# Patient Record
Sex: Female | Born: 1995 | Race: Black or African American | Hispanic: No | Marital: Single | Smoking: Never smoker
Health system: Southern US, Community
[De-identification: ages and names within clinical notes are randomized; demographics above are authoritative.]

## PROBLEM LIST (undated history)

## (undated) ENCOUNTER — Inpatient Hospital Stay (HOSPITAL_COMMUNITY): Payer: Self-pay

## (undated) ENCOUNTER — Emergency Department (HOSPITAL_COMMUNITY): Admission: EM | Payer: Self-pay | Source: Home / Self Care

## (undated) DIAGNOSIS — S83106A Unspecified dislocation of unspecified knee, initial encounter: Secondary | ICD-10-CM

## (undated) DIAGNOSIS — I10 Essential (primary) hypertension: Secondary | ICD-10-CM

## (undated) DIAGNOSIS — J45909 Unspecified asthma, uncomplicated: Secondary | ICD-10-CM

## (undated) DIAGNOSIS — L309 Dermatitis, unspecified: Secondary | ICD-10-CM

## (undated) DIAGNOSIS — F319 Bipolar disorder, unspecified: Secondary | ICD-10-CM

## (undated) DIAGNOSIS — R4689 Other symptoms and signs involving appearance and behavior: Secondary | ICD-10-CM

## (undated) HISTORY — DX: Unspecified asthma, uncomplicated: J45.909

## (undated) HISTORY — PX: TONSILLECTOMY: SUR1361

---
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
## 2014-01-05 ENCOUNTER — Emergency Department (HOSPITAL_COMMUNITY)
Admission: EM | Admit: 2014-01-05 | Discharge: 2014-01-05 | Disposition: A | Discharge status: Home / Self Care | Payer: Medicaid Other | Attending: Emergency Medicine | Admitting: Emergency Medicine

## 2014-01-05 ENCOUNTER — Encounter (HOSPITAL_COMMUNITY): Payer: Self-pay | Admitting: Emergency Medicine

## 2014-01-05 DIAGNOSIS — S199XXA Unspecified injury of neck, initial encounter: Principal | ICD-10-CM

## 2014-01-05 DIAGNOSIS — S0993XA Unspecified injury of face, initial encounter: Secondary | ICD-10-CM | POA: Insufficient documentation

## 2014-01-05 DIAGNOSIS — M542 Cervicalgia: Secondary | ICD-10-CM

## 2014-01-05 DIAGNOSIS — Z79899 Other long term (current) drug therapy: Secondary | ICD-10-CM | POA: Diagnosis not present

## 2014-01-05 DIAGNOSIS — IMO0002 Reserved for concepts with insufficient information to code with codable children: Secondary | ICD-10-CM | POA: Diagnosis not present

## 2014-01-05 DIAGNOSIS — Z872 Personal history of diseases of the skin and subcutaneous tissue: Secondary | ICD-10-CM | POA: Insufficient documentation

## 2014-01-05 MED ORDER — NAPROXEN 500 MG PO TABS: 500.0000 mg | ORAL_TABLET | Freq: Two times a day (BID) | ORAL | Status: DC
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
## 2014-01-05 NOTE — ED Notes (Signed)
Pt would also like to request a pregnancy test, if possible.

## 2014-01-06 NOTE — ED Provider Notes (Signed)
Medical screening examination/treatment/procedure(s) were performed by non-physician practitioner and as supervising physician I was immediately available for consultation/collaboration.  Megan Docherty, MD 01/06/14 1435 

## 2014-10-16 ENCOUNTER — Encounter (HOSPITAL_COMMUNITY): Payer: Self-pay

## 2014-10-16 ENCOUNTER — Emergency Department (HOSPITAL_COMMUNITY)
Admission: EM | Admit: 2014-10-16 | Discharge: 2014-10-16 | Disposition: A | Discharge status: Home / Self Care | Payer: Medicaid Other | Attending: Emergency Medicine | Admitting: Emergency Medicine

## 2014-10-16 DIAGNOSIS — Z872 Personal history of diseases of the skin and subcutaneous tissue: Secondary | ICD-10-CM | POA: Insufficient documentation

## 2014-10-16 DIAGNOSIS — Z3202 Encounter for pregnancy test, result negative: Secondary | ICD-10-CM | POA: Insufficient documentation

## 2014-10-16 DIAGNOSIS — R51 Headache: Secondary | ICD-10-CM | POA: Insufficient documentation

## 2014-10-16 DIAGNOSIS — R1030 Lower abdominal pain, unspecified: Secondary | ICD-10-CM | POA: Diagnosis present

## 2014-10-16 DIAGNOSIS — F1210 Cannabis abuse, uncomplicated: Secondary | ICD-10-CM | POA: Diagnosis not present

## 2014-10-16 DIAGNOSIS — R1084 Generalized abdominal pain: Secondary | ICD-10-CM | POA: Insufficient documentation

## 2014-10-16 DIAGNOSIS — R519 Headache, unspecified: Secondary | ICD-10-CM

## 2014-10-16 HISTORY — DX: Other symptoms and signs involving appearance and behavior: R46.89

## 2014-10-16 HISTORY — DX: Bipolar disorder, unspecified: F31.9

## 2014-10-16 LAB — COMPREHENSIVE METABOLIC PANEL
ALT: 16 U/L (ref 14–54)
AST: 75 U/L — AB (ref 15–41)
Albumin: 4.3 g/dL (ref 3.5–5.0)
Alkaline Phosphatase: 51 U/L (ref 38–126)
Anion gap: 10 (ref 5–15)
BUN: 10 mg/dL (ref 6–20)
CHLORIDE: 106 mmol/L (ref 101–111)
CO2: 24 mmol/L (ref 22–32)
CREATININE: 0.91 mg/dL (ref 0.44–1.00)
Calcium: 9.6 mg/dL (ref 8.9–10.3)
GFR calc Af Amer: >60 mL/min (ref >60)
Glucose, Bld: 91 mg/dL (ref 65–99)
Potassium: 3.7 mmol/L (ref 3.5–5.1)
Sodium: 140 mmol/L (ref 135–145)
TOTAL PROTEIN: 7.3 g/dL (ref 6.5–8.1)
Total Bilirubin: 0.7 mg/dL (ref 0.3–1.2)

## 2014-10-16 LAB — I-STAT BETA HCG BLOOD, ED (MC, WL, AP ONLY): I-stat hCG, quantitative: <5 m[IU]/mL (ref <5)

## 2014-10-16 LAB — CBC WITH DIFFERENTIAL/PLATELET
BASOS ABS: 0 10*3/uL (ref 0.0–0.1)
Basophils Relative: 0 % (ref 0–1)
EOS ABS: 0.1 10*3/uL (ref 0.0–0.7)
Eosinophils Relative: 1 % (ref 0–5)
HCT: 38 % (ref 36.0–46.0)
Hemoglobin: 12.9 g/dL (ref 12.0–15.0)
Lymphocytes Relative: 17 % (ref 12–46)
Lymphs Abs: 1.4 10*3/uL (ref 0.7–4.0)
MCH: 31.4 pg (ref 26.0–34.0)
MCHC: 33.9 g/dL (ref 30.0–36.0)
MCV: 92.5 fL (ref 78.0–100.0)
Monocytes Absolute: 0.5 10*3/uL (ref 0.1–1.0)
Monocytes Relative: 7 % (ref 3–12)
NEUTROS ABS: 6.2 10*3/uL (ref 1.7–7.7)
NEUTROS PCT: 75 % (ref 43–77)
Platelets: 252 10*3/uL (ref 150–400)
RBC: 4.11 MIL/uL (ref 3.87–5.11)
RDW: 12.9 % (ref 11.5–15.5)
WBC: 8.2 10*3/uL (ref 4.0–10.5)

## 2014-10-16 LAB — ETHANOL: Alcohol, Ethyl (B): <5 mg/dL (ref <5)

## 2014-10-16 LAB — RAPID URINE DRUG SCREEN, HOSP PERFORMED
Amphetamines: NOT DETECTED
Barbiturates: NOT DETECTED
Benzodiazepines: NOT DETECTED
Cocaine: NOT DETECTED
Opiates: NOT DETECTED
Tetrahydrocannabinol: POSITIVE — AB

## 2014-10-16 LAB — POC URINE PREG, ED: Preg Test, Ur: NEGATIVE
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
## 2014-10-16 NOTE — ED Notes (Signed)
MD at bedside. 

## 2014-10-16 NOTE — Discharge Instructions (Signed)
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
## 2014-10-16 NOTE — ED Notes (Signed)
Mignon PineYVONNE FORSTER-MOTHER [phone]  MEDICAL HX FOR THIS PT CONTACT  [facility]  [facility] [place]
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
## 2014-10-16 NOTE — ED Notes (Signed)
Pt asking about results of blood work and where she could get a copy. rn explained mychart to pt and how to see results through that system.
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
## 2014-12-01 ENCOUNTER — Ambulatory Visit (INDEPENDENT_AMBULATORY_CARE_PROVIDER_SITE_OTHER): Payer: Self-pay

## 2014-12-01 DIAGNOSIS — Z309 Encounter for contraceptive management, unspecified: Secondary | ICD-10-CM

## 2014-12-01 DIAGNOSIS — Z3202 Encounter for pregnancy test, result negative: Secondary | ICD-10-CM

## 2014-12-01 LAB — POCT URINE PREGNANCY: Preg Test, Ur: NEGATIVE

## 2015-06-03 ENCOUNTER — Emergency Department (HOSPITAL_COMMUNITY): Payer: Medicaid Other

## 2015-06-03 ENCOUNTER — Encounter (HOSPITAL_COMMUNITY): Payer: Self-pay | Admitting: Emergency Medicine

## 2015-06-03 ENCOUNTER — Emergency Department (HOSPITAL_COMMUNITY)
Admission: EM | Admit: 2015-06-03 | Discharge: 2015-06-03 | Disposition: A | Discharge status: Home / Self Care | Payer: Medicaid Other | Attending: Emergency Medicine | Admitting: Emergency Medicine

## 2015-06-03 DIAGNOSIS — R059 Cough, unspecified: Secondary | ICD-10-CM

## 2015-06-03 DIAGNOSIS — J069 Acute upper respiratory infection, unspecified: Secondary | ICD-10-CM | POA: Diagnosis not present

## 2015-06-03 DIAGNOSIS — Z8659 Personal history of other mental and behavioral disorders: Secondary | ICD-10-CM | POA: Insufficient documentation

## 2015-06-03 DIAGNOSIS — Z872 Personal history of diseases of the skin and subcutaneous tissue: Secondary | ICD-10-CM | POA: Diagnosis not present

## 2015-06-03 DIAGNOSIS — R05 Cough: Secondary | ICD-10-CM | POA: Diagnosis present

## 2015-06-03 DIAGNOSIS — B9789 Other viral agents as the cause of diseases classified elsewhere: Secondary | ICD-10-CM

## 2015-06-03 MED ORDER — ALBUTEROL SULFATE HFA 108 (90 BASE) MCG/ACT IN AERS
2.0000 | INHALATION_SPRAY | RESPIRATORY_TRACT | Status: DC | PRN
  Filled 2015-06-03: qty 6.7

## 2015-06-03 MED ORDER — PREDNISONE 20 MG PO TABS: 60.0000 mg | ORAL_TABLET | Freq: Every day | ORAL | Status: DC

## 2015-06-03 NOTE — ED Provider Notes (Signed)
CSN: [id number]     Arrival date & time 06/03/15  1204 History  By signing my name below, I, Placido Sou, attest that this documentation has been prepared under the direction and in the presence of [employer], PA-C. Electronically Signed: Placido Sou, ED Scribe. 06/03/2015. 12:46 PM.   Chief Complaint  Patient presents with  . Cough   The history is provided by the patient. No language interpreter was used.    HPI Comments: Holly Greene is a 20 y.o. female who presents to the Emergency Department complaining of constant, mild, productive cough onset 5 days ago. She reports associated rhinorrhea that has somewhat alleviated and post nasal drip stating that these symptoms are consistent with past episodes of seasonal allergies. She also notes a PMHx including asthma and has an albuterol inhaler which she has not used for many years. Pt has not taken any medications for her symptoms. Pt denies fevers, chills, SOB, n/v or any other associated symptoms at this time.    Past Medical History  Diagnosis Date  . Eczema   . Bipolar 1 disorder   . Defiant behavior    No past surgical history on file. No family history on file. Social History  Substance Use Topics  . Smoking status: Never Smoker   . Smokeless tobacco: Not on file  . Alcohol Use: No   OB History    No data available     Review of Systems A complete 10 system review of systems was obtained and all systems are negative except as noted in the HPI and PMH.   Allergies  Review of patient's allergies indicates no known allergies.  Home Medications   Prior to Admission medications   Medication Sig Start Date End Date Taking? Authorizing Provider  fluocinonide ointment (LIDEX) 0.05 % Apply 1 application topically 2 (two) times daily. Entire body Patient not taking: Reported on 10/16/2014 05/26/13   Marlon Pel, PA-C  hydrOXYzine (ATARAX/VISTARIL) 10 MG tablet Take 1 tablet (10 mg total) by mouth 3 (three)  times daily as needed for itching. Patient not taking: Reported on 10/16/2014 05/26/13   Marlon Pel, PA-C  naproxen (NAPROSYN) 500 MG tablet Take 1 tablet (500 mg total) by mouth 2 (two) times daily. Patient not taking: Reported on 10/16/2014 01/05/14   Joycie Peek, PA-C  predniSONE (DELTASONE) 50 MG tablet Take one tablet daily with breakfast Patient not taking: Reported on 10/16/2014 07/01/13   Ozella Rocks, MD  triamcinolone ointment (KENALOG) 0.1 % Apply 1 application topically 2 (two) times daily. Use for 1-2 weeks Patient not taking: Reported on 10/16/2014 07/01/13   Ozella Rocks, MD   BP 133/78 mmHg  Pulse 66  Temp(Src) 98.2 F (36.8 C) (Oral)  Resp 16  SpO2 94%  LMP 05/24/2015    Physical Exam  Constitutional: She is oriented to person, place, and time. She appears well-developed and well-nourished.  HENT:  Head: Normocephalic and atraumatic.  Right Ear: Hearing, tympanic membrane, external ear and ear canal normal.  Left Ear: Hearing, tympanic membrane, external ear and ear canal normal.  Nose: Nose normal.  Mouth/Throat: Uvula is midline, oropharynx is clear and moist and mucous membranes are normal.  Eyes: EOM are normal.  Neck: Normal range of motion.  Cardiovascular: Normal rate and regular rhythm.   Pulmonary/Chest: Effort normal. No respiratory distress. She has wheezes (mild diffuse inspiratory and expiratory wheezing bilaterally).  Abdominal: Soft.  Musculoskeletal: Normal range of motion.  Neurological: She is alert and oriented to person,  place, and time.  Skin: Skin is warm and dry.  Psychiatric: She has a normal mood and affect.  Nursing note and vitals reviewed.   ED Course  Procedures  DIAGNOSTIC STUDIES: Oxygen Saturation is 97% on RA, normal by my interpretation.    COORDINATION OF CARE: 12:44 PM Discussed next steps with pt including DG of the chest and reevaluation based on imaging results. Pt verbalized understanding and is agreeable with the  plan.   Labs Review Labs Reviewed - No data to display  Imaging Review Dg Chest 2 View  06/03/2015  CLINICAL DATA:  Wheezing and cough for 1 week, history asthma EXAM: CHEST  2 VIEW COMPARISON:  None FINDINGS: Normal heart size, mediastinal contours, and pulmonary vascularity. Lungs clear. No pneumothorax. Bones unremarkable. IMPRESSION: Normal exam. Electronically Signed   By: Ulyses Southward M.D.   On: 06/03/2015 13:23   I have personally reviewed and evaluated these images as part of my medical decision-making.   EKG Interpretation None      MDM   Final diagnoses:  None  Pt CXR negative for acute infiltrate. Patients symptoms are consistent with URI, likely viral etiology. Discussed that antibiotics are not indicated for viral infections. Pt will be discharged with symptomatic treatment.  Verbalizes understanding and is agreeable with plan. Pt is hemodynamically stable & in NAD prior to dc.  I personally performed the services described in this documentation, which was scribed in my presence. The recorded information has been reviewed and is accurate.    Santiago Glad, PA-C 06/03/15 1428  Azalia Bilis, MD 06/03/15 [phone]

## 2015-06-03 NOTE — ED Notes (Addendum)
Pt c/o cough and runny nose x 4 days. Pt sts she has been out of work for four days because of it and needs a note in order to go back to work. Pt denies fever, N/V. Pt sts she gets these symptoms this time every year. A&Ox4 and ambulatory. Pt has not taken any OTC medications.

## 2015-11-24 ENCOUNTER — Encounter: Payer: Self-pay | Admitting: *Deleted

## 2015-11-24 ENCOUNTER — Ambulatory Visit (INDEPENDENT_AMBULATORY_CARE_PROVIDER_SITE_OTHER): Payer: Medicaid Other | Admitting: *Deleted

## 2015-11-24 DIAGNOSIS — Z3201 Encounter for pregnancy test, result positive: Secondary | ICD-10-CM

## 2015-11-24 DIAGNOSIS — Z3200 Encounter for pregnancy test, result unknown: Secondary | ICD-10-CM

## 2015-11-24 LAB — POCT PREGNANCY, URINE: Preg Test, Ur: POSITIVE — AB

## 2015-11-24 NOTE — Progress Notes (Signed)
Patient presents for pregnancy test which is positive. Reviewed patient's allergies and medications, recommended beginning prenatal vitamins. EDC 08/06/16 by LMP. Patient to pick up list of prenatal care providers and schedule initial ob visit around 8-12 weeks. Pregnancy verification letter given.

## 2015-12-06 ENCOUNTER — Encounter (HOSPITAL_COMMUNITY): Payer: Self-pay | Admitting: Emergency Medicine

## 2015-12-06 ENCOUNTER — Emergency Department (HOSPITAL_COMMUNITY)
Admission: EM | Admit: 2015-12-06 | Discharge: 2015-12-06 | Disposition: A | Discharge status: Home / Self Care | Payer: Medicaid Other | Attending: Emergency Medicine | Admitting: Emergency Medicine

## 2015-12-06 ENCOUNTER — Emergency Department (HOSPITAL_COMMUNITY): Payer: Medicaid Other

## 2015-12-06 DIAGNOSIS — O26891 Other specified pregnancy related conditions, first trimester: Secondary | ICD-10-CM | POA: Diagnosis present

## 2015-12-06 DIAGNOSIS — O99321 Drug use complicating pregnancy, first trimester: Secondary | ICD-10-CM | POA: Diagnosis not present

## 2015-12-06 DIAGNOSIS — J45909 Unspecified asthma, uncomplicated: Secondary | ICD-10-CM | POA: Insufficient documentation

## 2015-12-06 DIAGNOSIS — Z3A01 Less than 8 weeks gestation of pregnancy: Secondary | ICD-10-CM | POA: Insufficient documentation

## 2015-12-06 DIAGNOSIS — F1290 Cannabis use, unspecified, uncomplicated: Secondary | ICD-10-CM | POA: Diagnosis not present

## 2015-12-06 DIAGNOSIS — R102 Pelvic and perineal pain: Secondary | ICD-10-CM | POA: Diagnosis not present

## 2015-12-06 DIAGNOSIS — O209 Hemorrhage in early pregnancy, unspecified: Secondary | ICD-10-CM

## 2015-12-06 DIAGNOSIS — Z79899 Other long term (current) drug therapy: Secondary | ICD-10-CM | POA: Insufficient documentation

## 2015-12-06 LAB — COMPREHENSIVE METABOLIC PANEL
ALBUMIN: 4.3 g/dL (ref 3.5–5.0)
ALK PHOS: 49 U/L (ref 38–126)
ALT: 15 U/L (ref 14–54)
ANION GAP: 6 (ref 5–15)
AST: 52 U/L — ABNORMAL HIGH (ref 15–41)
BUN: 9 mg/dL (ref 6–20)
CALCIUM: 9.6 mg/dL (ref 8.9–10.3)
CHLORIDE: 108 mmol/L (ref 101–111)
CO2: 24 mmol/L (ref 22–32)
Creatinine, Ser: 0.67 mg/dL (ref 0.44–1.00)
GFR calc Af Amer: >60 mL/min (ref >60)
GFR calc non Af Amer: >60 mL/min (ref >60)
GLUCOSE: 92 mg/dL (ref 65–99)
Potassium: 3.5 mmol/L (ref 3.5–5.1)
SODIUM: 138 mmol/L (ref 135–145)
Total Bilirubin: 0.3 mg/dL (ref 0.3–1.2)
Total Protein: 7.6 g/dL (ref 6.5–8.1)

## 2015-12-06 LAB — URINALYSIS, ROUTINE W REFLEX MICROSCOPIC
BILIRUBIN URINE: NEGATIVE
GLUCOSE, UA: NEGATIVE mg/dL
HGB URINE DIPSTICK: NEGATIVE
Ketones, ur: NEGATIVE mg/dL
Nitrite: NEGATIVE
PH: 7.5 (ref 5.0–8.0)
Protein, ur: NEGATIVE mg/dL
SPECIFIC GRAVITY, URINE: 1.009 (ref 1.005–1.030)

## 2015-12-06 LAB — URINE MICROSCOPIC-ADD ON: RBC / HPF: NONE SEEN RBC/hpf (ref 0–5)

## 2015-12-06 LAB — WET PREP, GENITAL
SPERM: NONE SEEN
TRICH WET PREP: NONE SEEN
YEAST WET PREP: NONE SEEN

## 2015-12-06 LAB — CBC
HEMATOCRIT: 37.7 % (ref 36.0–46.0)
HEMOGLOBIN: 12.8 g/dL (ref 12.0–15.0)
MCH: 30.8 pg (ref 26.0–34.0)
MCHC: 34 g/dL (ref 30.0–36.0)
MCV: 90.6 fL (ref 78.0–100.0)
Platelets: 279 10*3/uL (ref 150–400)
RBC: 4.16 MIL/uL (ref 3.87–5.11)
RDW: 13.5 % (ref 11.5–15.5)
WBC: 9.5 10*3/uL (ref 4.0–10.5)

## 2015-12-06 LAB — HCG, QUANTITATIVE, PREGNANCY: hCG, Beta Chain, Quant, S: 17568 m[IU]/mL — ABNORMAL HIGH (ref <5)

## 2015-12-06 LAB — I-STAT BETA HCG BLOOD, ED (MC, WL, AP ONLY): I-stat hCG, quantitative: >2000 m[IU]/mL — ABNORMAL HIGH (ref <5)

## 2015-12-06 LAB — LIPASE, BLOOD: Lipase: 55 U/L — ABNORMAL HIGH (ref 11–51)

## 2015-12-06 MED ORDER — ACETAMINOPHEN 500 MG PO TABS
1000.0000 mg | ORAL_TABLET | Freq: Once | ORAL | Status: AC
  Administered 2015-12-06: 1000 mg via ORAL
  Filled 2015-12-06: qty 2

## 2015-12-06 NOTE — ED Notes (Signed)
Patient transported to Ultrasound 

## 2015-12-06 NOTE — Progress Notes (Signed)
Noted medicaid [place] access pt listed without pcp  EPIC medicaid response hx indicates it is [facility] EPIC updated Entered in d/c instructions  [facility]  Obstetrics and Gynecology [phone] [address], [address] [place] [id number]    Next Steps: Go on 01/12/2016    Instructions: You have a scheduled appointment on 01/12/16 at 0900

## 2015-12-06 NOTE — ED Triage Notes (Signed)
Patient reports lower abdominal pain x3 days. Reports nausea. Denies vaginal discharge/bleeding, vomiting, and diarrhea. Patient states she is [redacted] weeks pregnant.

## 2015-12-06 NOTE — Discharge Instructions (Signed)
It is very important that you follow-up for repeat blood work in 48 hours. Do not hesitate to return [facility] the [facility] or any close emergency room if you have worsening abdominal pain, heavy bleeding, feel like you're going to pass out, feel short of breath or like her heart is racing quickly.  Please take Tylenol for pain control. Continue to take your prenatal vitamins.
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
## 2015-12-07 LAB — RPR: RPR: NONREACTIVE

## 2015-12-07 LAB — GC/CHLAMYDIA PROBE AMP (~~LOC~~) NOT AT ARMC
CHLAMYDIA, DNA PROBE: NEGATIVE
NEISSERIA GONORRHEA: NEGATIVE

## 2015-12-07 LAB — ABO/RH: ABO/RH(D): O POS

## 2015-12-07 LAB — HIV ANTIBODY (ROUTINE TESTING W REFLEX): HIV SCREEN 4TH GENERATION: NONREACTIVE

## 2015-12-08 ENCOUNTER — Other Ambulatory Visit: Payer: Medicaid Other

## 2015-12-08 DIAGNOSIS — Z3200 Encounter for pregnancy test, result unknown: Secondary | ICD-10-CM

## 2015-12-09 LAB — BETA HCG QUANT (REF LAB): HCG QUANT: 23667 m[IU]/mL

## 2015-12-29 ENCOUNTER — Encounter: Payer: Self-pay | Admitting: Advanced Practice Midwife

## 2015-12-29 ENCOUNTER — Inpatient Hospital Stay (HOSPITAL_COMMUNITY)
Admission: AD | Admit: 2015-12-29 | Discharge: 2015-12-29 | Disposition: A | Discharge status: Home / Self Care | Payer: Medicaid Other | Attending: Obstetrics & Gynecology | Admitting: Obstetrics & Gynecology

## 2015-12-29 DIAGNOSIS — Z3A08 8 weeks gestation of pregnancy: Secondary | ICD-10-CM | POA: Insufficient documentation

## 2015-12-29 DIAGNOSIS — O99611 Diseases of the digestive system complicating pregnancy, first trimester: Secondary | ICD-10-CM | POA: Insufficient documentation

## 2015-12-29 DIAGNOSIS — J069 Acute upper respiratory infection, unspecified: Secondary | ICD-10-CM | POA: Diagnosis not present

## 2015-12-29 DIAGNOSIS — J4520 Mild intermittent asthma, uncomplicated: Secondary | ICD-10-CM | POA: Insufficient documentation

## 2015-12-29 DIAGNOSIS — R0981 Nasal congestion: Secondary | ICD-10-CM | POA: Diagnosis present

## 2015-12-29 LAB — URINALYSIS, ROUTINE W REFLEX MICROSCOPIC
Bilirubin Urine: NEGATIVE
GLUCOSE, UA: NEGATIVE mg/dL
Hgb urine dipstick: NEGATIVE
KETONES UR: 15 mg/dL — AB
Nitrite: POSITIVE — AB
PH: 6.5 (ref 5.0–8.0)
Protein, ur: NEGATIVE mg/dL
SPECIFIC GRAVITY, URINE: 1.025 (ref 1.005–1.030)

## 2015-12-29 LAB — URINE MICROSCOPIC-ADD ON

## 2015-12-29 MED ORDER — ALBUTEROL SULFATE HFA 108 (90 BASE) MCG/ACT IN AERS: 2.0000 | INHALATION_SPRAY | Freq: Four times a day (QID) | RESPIRATORY_TRACT | 2 refills | Status: DC | PRN

## 2015-12-29 MED ORDER — ALBUTEROL SULFATE (2.5 MG/3ML) 0.083% IN NEBU
2.5000 mg | INHALATION_SOLUTION | Freq: Once | RESPIRATORY_TRACT | Status: AC
  Administered 2015-12-29: 2.5 mg via RESPIRATORY_TRACT
  Filled 2015-12-29: qty 3

## 2015-12-29 MED ORDER — GUAIFENESIN ER 600 MG PO TB12: 600.0000 mg | ORAL_TABLET | Freq: Two times a day (BID) | ORAL | 1 refills | Status: DC

## 2015-12-29 NOTE — MAU Note (Signed)
Pt reports having a cough runny nose (seasonal allergy) x 2-3 days.  Pt also c/o nausea

## 2015-12-29 NOTE — Discharge Instructions (Signed)
Upper Respiratory Infection, Adult Most upper respiratory infections (URIs) are a viral infection of the air passages leading to the lungs. A URI affects the nose, throat, and upper air passages. The most common type of URI is nasopharyngitis and is typically referred to as "the common cold." URIs run their course and usually go away on their own. Most of the time, a URI does not require medical attention, but sometimes a bacterial infection in the upper airways can follow a viral infection. This is called a secondary infection. Sinus and middle ear infections are common types of secondary upper respiratory infections. Bacterial pneumonia can also complicate a URI. A URI can worsen asthma and chronic obstructive pulmonary disease (COPD). Sometimes, these complications can require emergency medical care and may be life threatening.  CAUSES Almost all URIs are caused by viruses. A virus is a type of germ and can spread from one person to another.  RISKS FACTORS You may be at risk for a URI if:   You smoke.   You have chronic heart or lung disease.  You have a weakened defense (immune) system.   You are very young or very old.   You have nasal allergies or asthma.  You work in crowded or poorly ventilated areas.  You work in health care facilities or schools. SIGNS AND SYMPTOMS  Symptoms typically develop 2-3 days after you come in contact with a cold virus. Most viral URIs last 7-10 days. However, viral URIs from the influenza virus (flu virus) can last 14-18 days and are typically more severe. Symptoms may include:   Runny or stuffy (congested) nose.   Sneezing.   Cough.   Sore throat.   Headache.   Fatigue.   Fever.   Loss of appetite.   Pain in your forehead, behind your eyes, and over your cheekbones (sinus pain).  Muscle aches.  DIAGNOSIS  Your health care provider may diagnose a URI by:  Physical exam.  Tests to check that your symptoms are not due to  another condition such as:  Strep throat.  Sinusitis.  Pneumonia.  Asthma. TREATMENT  A URI goes away on its own with time. It cannot be cured with medicines, but medicines may be prescribed or recommended to relieve symptoms. Medicines may help:  Reduce your fever.  Reduce your cough.  Relieve nasal congestion. HOME CARE INSTRUCTIONS   Take medicines only as directed by your health care provider.   Gargle warm saltwater or take cough drops to comfort your throat as directed by your health care provider.  Use a warm mist humidifier or inhale steam from a shower to increase air moisture. This may make it easier to breathe.  Drink enough fluid to keep your urine clear or pale yellow.   Eat soups and other clear broths and maintain good nutrition.   Rest as needed.   Return to work when your temperature has returned to normal or as your health care provider advises. You may need to stay home longer to avoid infecting others. You can also use a face mask and careful hand washing to prevent spread of the virus.  Increase the usage of your inhaler if you have asthma.   Do not use any tobacco products, including cigarettes, chewing tobacco, or electronic cigarettes. If you need help quitting, ask your health care provider. PREVENTION  The best way to protect yourself from getting a cold is to practice good hygiene.   Avoid oral or hand contact with people with cold  symptoms.   °· Wash your hands often if contact occurs.   °There is no clear evidence that vitamin C, vitamin E, echinacea, or exercise reduces the chance of developing a cold. However, it is always recommended to get plenty of rest, exercise, and practice good nutrition.  °SEEK MEDICAL CARE IF:  °· You are getting worse rather than better.   °· Your symptoms are not controlled by medicine.   °· You have chills. °· You have worsening shortness of breath. °· You have brown or red mucus. °· You have yellow or brown nasal  discharge. °· You have pain in your face, especially when you bend forward. °· You have a fever. °· You have swollen neck glands. °· You have pain while swallowing. °· You have white areas in the back of your throat. °SEEK IMMEDIATE MEDICAL CARE IF:  °· You have severe or persistent: °¨ Headache. °¨ Ear pain. °¨ Sinus pain. °¨ Chest pain. °· You have chronic lung disease and any of the following: °¨ Wheezing. °¨ Prolonged cough. °¨ Coughing up blood. °¨ A change in your usual mucus. °· You have a stiff neck. °· You have changes in your: °¨ Vision. °¨ Hearing. °¨ Thinking. °¨ Mood. °MAKE SURE YOU:  °· Understand these instructions. °· Will watch your condition. °· Will get help right away if you are not doing well or get worse. °  °This information is not intended to replace advice given to you by your health care provider. Make sure you discuss any questions you have with your health care provider. °  °Document Released: 09/19/2000 Document Revised: 08/10/2014 Document Reviewed: 07/01/2013 °Elsevier Interactive Patient Education ©2016 [employer] ° ° ° ° °Safe Medications in Pregnancy  ° °Acne: °Benzoyl Peroxide °Salicylic Acid ° °Backache/Headache: °Tylenol: 2 regular strength every 4 hours OR °             2 Extra strength every 6 hours ° °Colds/Coughs/Allergies: °Benadryl (alcohol free) 25 mg every 6 hours as needed °Breath right strips °Claritin °Cepacol throat lozenges °Chloraseptic throat spray °Cold-Eeze- up to three times per day °Cough drops, alcohol free °Flonase (by prescription only) °Guaifenesin °Mucinex °Robitussin DM (plain only, alcohol free) °Saline nasal spray/drops °Sudafed (pseudoephedrine) & Actifed ** use only after [redacted] weeks gestation and if you do not have high blood pressure °Tylenol °Vicks Vaporub °Zinc lozenges °Zyrtec  ° °Constipation: °Colace °Ducolax suppositories °Fleet enema °Glycerin suppositories °Metamucil °Milk of magnesia °Miralax °Senokot °Smooth move  tea ° °Diarrhea: °Kaopectate °Imodium A-D ° °*NO pepto Bismol ° °Hemorrhoids: °Anusol °Anusol HC °Preparation H °Tucks ° °Indigestion: °Tums °Maalox °Mylanta °Zantac  °Pepcid ° °Insomnia: °Benadryl (alcohol free) 25mg every 6 hours as needed °Tylenol PM °Unisom, no Gelcaps ° °Leg Cramps: °Tums °MagGel ° °Nausea/Vomiting:  °Bonine °Dramamine °Emetrol °Ginger extract °Sea bands °Meclizine  °Nausea medication to take during pregnancy:  °Unisom (doxylamine succinate 25 mg tablets) Take one tablet daily at bedtime. If symptoms are not adequately controlled, the dose can be increased to a maximum recommended dose of two tablets daily (1/2 tablet in the morning, 1/2 tablet mid-afternoon and one at bedtime). °Vitamin B6 100mg tablets. Take one tablet twice a day (up to 200 mg per day). ° °Skin Rashes: °Aveeno products °Benadryl cream or 25mg every 6 hours as needed °Calamine Lotion °1% cortisone cream ° °Yeast infection: °Gyne-lotrimin 7 °Monistat 7 ° °Gum/tooth pain: °Anbesol ° °**If taking multiple medications, please check labels to avoid duplicating the same active ingredients °**take medication as directed on the label °**   Do not exceed 4000 mg of tylenol in 24 hours °**Do not take medications that contain aspirin or ibuprofen ° ° ° ° °

## 2015-12-29 NOTE — MAU Provider Note (Signed)
Chief Complaint: URI   First Provider Initiated Contact with Patient 12/29/15 1815        SUBJECTIVE HPI: Holly Greene is a 20 y.o. G1P0 at [redacted]w[redacted]d by LMP who presents to maternity admissions reporting nasal congestion, cough, and wheezing  Does have a hx of asthma, but lost inhaler. Has not called her primary doctor She denies vaginal bleeding, vaginal itching/burning, urinary symptoms, h/a, dizziness, or fever/chills.    Cough  This is a new problem. The current episode started in the past 7 days. The problem has been unchanged. The cough is productive of sputum. Associated symptoms include nasal congestion, rhinorrhea and wheezing. Pertinent negatives include no chest pain, chills, ear pain, fever, headaches, myalgias, sore throat or shortness of breath. Nothing aggravates the symptoms. She has tried nothing for the symptoms. Her past medical history is significant for asthma.   RN Note: Pt reports having a cough runny nose (seasonal allergy) x 2-3 days.  Pt also c/o nausea   Past Medical History:  Diagnosis Date  . Asthma    as a child  . Bipolar 1 disorder (HCC)   . Defiant behavior   . Eczema    Past Surgical History:  Procedure Laterality Date  . TONSILLECTOMY     Social History   Social History  . Marital status: Single    Spouse name: N/A  . Number of children: N/A  . Years of education: N/A   Occupational History  . Not on file.   Social History Main Topics  . Smoking status: Never Smoker  . Smokeless tobacco: Never Used  . Alcohol use No  . Drug use:     Types: Marijuana  . Sexual activity: Yes   Other Topics Concern  . Not on file   Social History Narrative  . No narrative on file   No current facility-administered medications on file prior to encounter.    Current Outpatient Prescriptions on File Prior to Encounter  Medication Sig Dispense Refill  . Prenatal Vit-Fe Fumarate-FA (PRENATAL MULTIVITAMIN) TABS tablet Take 1 tablet by mouth daily  at 12 noon.    . triamcinolone ointment (KENALOG) 0.1 % Apply 1 application topically 2 (two) times daily. Use for 1-2 weeks (Patient not taking: Reported on 10/16/2014) 30 g 0  . [DISCONTINUED] hydrALAZINE (APRESOLINE) 10 MG tablet Take 1 tablet (10 mg total) by mouth 3 (three) times daily. 30 tablet 0   No Known Allergies  I have reviewed patient's Past Medical Hx, Surgical Hx, Family Hx, Social Hx, medications and allergies.   ROS:  Review of Systems  Constitutional: Negative for chills and fever.  HENT: Positive for rhinorrhea. Negative for ear pain and sore throat.   Respiratory: Positive for cough and wheezing. Negative for shortness of breath.   Cardiovascular: Negative for chest pain.  Musculoskeletal: Negative for myalgias.  Neurological: Negative for headaches.   Other systems negative   Physical Exam  Patient Vitals for the past 24 hrs:  BP Temp Temp src Pulse Resp Height Weight  12/29/15 1803 123/74 98.4 F (36.9 C) Oral 96 18 5\' 7"  (1.702 m) 182 lb 6.4 oz (82.7 kg)    Physical Exam  Constitutional: Well-developed, well-nourished female in no acute distress.  Cardiovascular: normal rate Respiratory: normal effort     Faint Inspiratory and expiratory wheezes throughout GI: Abd soft, non-tender. Pos BS x 4 MS: Extremities nontender, no edema, normal ROM Neurologic: Alert and oriented x 4.  GU: Neg CVAT.  FHT 150 by bedside US  LAB RESULTS Results for orders placed or performed during the hospital encounter of 12/29/15 (from the past 72 hour(s))  Urinalysis, Routine w reflex microscopic (not at [facility])     Status: Abnormal   Collection Time: 12/29/15  6:05 PM  Result Value Ref Range   Color, Urine YELLOW YELLOW   APPearance CLOUDY (A) CLEAR   Specific Gravity, Urine 1.025 1.005 - 1.030   pH 6.5 5.0 - 8.0   Glucose, UA NEGATIVE NEGATIVE mg/dL   Hgb urine dipstick NEGATIVE NEGATIVE   Bilirubin Urine NEGATIVE NEGATIVE   Ketones, ur 15 (A) NEGATIVE mg/dL   Protein,  ur NEGATIVE NEGATIVE mg/dL   Nitrite POSITIVE (A) NEGATIVE   Leukocytes, UA TRACE (A) NEGATIVE  Urine microscopic-add on     Status: Abnormal   Collection Time: 12/29/15  6:05 PM  Result Value Ref Range   Squamous Epithelial / LPF 6-30 (A) NONE SEEN   WBC, UA 0-5 0 - 5 WBC/hpf   RBC / HPF 0-5 0 - 5 RBC/hpf   Bacteria, UA MANY (A) NONE SEEN    --/--/O POS (08/29 1227)  IMAGING Bedside US done by me Single intrauterine gestational sac visible Yolk sac visible Fetal pole measures 4934w0d + cardiac activity at 150  [place] Ob Comp Less 14 Wks  Result Date: 12/06/2015 CLINICAL DATA:  Pelvic pain for 3 days. Positive pregnancy test. Quantitative beta HCG level is 17,568. EXAM: OBSTETRIC <14 WK US AND TRANSVAGINAL OB US TECHNIQUE: Both transabdominal and transvaginal ultrasound examinations were performed for complete evaluation of the gestation as well as the maternal uterus, adnexal regions, and pelvic cul-de-sac. Transvaginal technique was performed to assess early pregnancy. COMPARISON:  None. FINDINGS: Intrauterine gestational sac: Yes Yolk sac:  Yes Embryo:  No Cardiac Activity: No MSD: 7.6  mm   5 w   4  d Subchorionic hemorrhage:  None visualized. Maternal uterus/adnexae: No uterine masses. Cervix is unremarkable. Normal right ovary. Left ovary demonstrates a dominant cyst measuring 3.8 x 2.8 x 2.8 cm. No adnexal masses. No pelvic free fluid. IMPRESSION: 1. Probable early intrauterine pregnancy with a gestational sac and yolk sac but no embryo visible. Recommend follow-up serial beta HCG levels and repeat ultrasound in 7-10 days to reassess for normal pregnancy progression. 2. No subchorionic hemorrhage or other complication of pregnancy. 3. 3.8 cm dominant left ovarian cyst. Electronically Signed   By: Amie Portlandavid  Ormond M.D.   On: 12/06/2015 14:56   [place] Ob Transvaginal  Result Date: 12/06/2015 CLINICAL DATA:  Pelvic pain for 3 days. Positive pregnancy test. Quantitative beta HCG level is 17,568.  EXAM: OBSTETRIC <14 WK US AND TRANSVAGINAL OB US TECHNIQUE: Both transabdominal and transvaginal ultrasound examinations were performed for complete evaluation of the gestation as well as the maternal uterus, adnexal regions, and pelvic cul-de-sac. Transvaginal technique was performed to assess early pregnancy. COMPARISON:  None. FINDINGS: Intrauterine gestational sac: Yes Yolk sac:  Yes Embryo:  No Cardiac Activity: No MSD: 7.6  mm   5 w   4  d Subchorionic hemorrhage:  None visualized. Maternal uterus/adnexae: No uterine masses. Cervix is unremarkable. Normal right ovary. Left ovary demonstrates a dominant cyst measuring 3.8 x 2.8 x 2.8 cm. No adnexal masses. No pelvic free fluid. IMPRESSION: 1. Probable early intrauterine pregnancy with a gestational sac and yolk sac but no embryo visible. Recommend follow-up serial beta HCG levels and repeat ultrasound in 7-10 days to reassess for normal pregnancy progression. 2. No subchorionic hemorrhage or other complication of pregnancy. 3.  3.8 cm dominant left ovarian cyst. Electronically Signed   By: Amie Portland M.D.   On: 12/06/2015 14:56    MAU Management/MDM: ALbuterol inhalation treatment ordered Wheezes cleared after coughing and treatment No fever or signs of flu     ASSESSMENT SIUP at [redacted]w[redacted]d Upper Respiratory Infection with wheezing Asthma, intermittent, rare episodes  PLAN Discharge home Rx ALbuterol inhaler for home PRN use Rx Mucinex for URI FOllow up in clinic   Pt stable at time of discharge. Encouraged to return here or to other Urgent Care/ED if she develops worsening of symptoms, increase in pain, fever, or other concerning symptoms.    Wynelle Bourgeois CNM, MSN Certified Nurse-Midwife 12/29/2015  6:53 PM

## 2016-01-12 ENCOUNTER — Encounter: Payer: Self-pay | Admitting: Obstetrics and Gynecology

## 2016-01-12 ENCOUNTER — Ambulatory Visit (INDEPENDENT_AMBULATORY_CARE_PROVIDER_SITE_OTHER): Payer: Medicaid Other | Admitting: Obstetrics and Gynecology

## 2016-01-12 ENCOUNTER — Other Ambulatory Visit (HOSPITAL_COMMUNITY)
Admission: RE | Admit: 2016-01-12 | Discharge: 2016-01-12 | Disposition: A | Discharge status: Home / Self Care | Payer: Medicaid Other | Source: Ambulatory Visit | Attending: Obstetrics and Gynecology | Admitting: Obstetrics and Gynecology

## 2016-01-12 DIAGNOSIS — Z113 Encounter for screening for infections with a predominantly sexual mode of transmission: Secondary | ICD-10-CM | POA: Insufficient documentation

## 2016-01-12 DIAGNOSIS — Z3400 Encounter for supervision of normal first pregnancy, unspecified trimester: Secondary | ICD-10-CM | POA: Insufficient documentation

## 2016-01-12 DIAGNOSIS — Z3401 Encounter for supervision of normal first pregnancy, first trimester: Secondary | ICD-10-CM | POA: Diagnosis not present

## 2016-01-12 NOTE — Progress Notes (Signed)
  Subjective:    Holly Greene is a G1P0 9269w3d being seen today for her first obstetrical visit.  Her obstetrical history is significant for first pregnancy. Patient does intend to breast feed. Pregnancy history fully reviewed.  Patient reports no complaints.  Vitals:   01/12/16 0915  BP: 118/72  Pulse: 90  Temp: 98 F (36.7 C)  Weight: 183 lb 14.4 oz (83.4 kg)    HISTORY: OB History  Gravida Para Term Preterm AB Living  1            SAB TAB Ectopic Multiple Live Births               # Outcome Date GA Lbr Len/2nd Weight Sex Delivery Anes PTL Lv  1 Current              Past Medical History:  Diagnosis Date  . Asthma    as a child  . Bipolar 1 disorder (HCC)   . Defiant behavior   . Eczema    Past Surgical History:  Procedure Laterality Date  . TONSILLECTOMY     No family history on file.   Exam    Uterus:   approximately 10-week size  Pelvic Exam:    Perineum: Normal Perineum   Vulva: normal   Vagina:  normal mucosa, normal discharge   pH:    Cervix: nulliparous appearance   Adnexa: normal adnexa and no mass, fullness, tenderness   Bony Pelvis: gynecoid  System: Breast:  normal appearance, no masses or tenderness   Skin: normal coloration and turgor, no rashes    Neurologic: oriented, no focal deficits   Extremities: normal strength, tone, and muscle mass   HEENT extra ocular movement intact   Mouth/Teeth mucous membranes moist, pharynx normal without lesions and dental hygiene good   Neck supple and no masses   Cardiovascular: regular rate and rhythm   Respiratory:  chest clear, no wheezing, crepitations, rhonchi, normal symmetric air entry   Abdomen: soft, non-tender; bowel sounds normal; no masses,  no organomegaly   Urinary:       Assessment:    Pregnancy: G1P0 Patient Active Problem List   Diagnosis Date Noted  . Supervision of normal first pregnancy, antepartum 01/12/2016        Plan:     Initial labs drawn. Prenatal  vitamins. Problem list reviewed and updated. Genetic Screening discussed First Screen: ordered.  Ultrasound discussed; fetal survey: requested. Patient agrees to flu vaccine today  Follow up in 4 weeks. 50% of 30 min visit spent on counseling and coordination of care.     Bhargav Barbaro 01/12/2016

## 2016-01-12 NOTE — Patient Instructions (Signed)
First Trimester of Pregnancy The first trimester of pregnancy is from week 1 until the end of week 12 (months 1 through 3). A week after a sperm fertilizes an egg, the egg will implant on the wall of the uterus. This embryo will begin to develop into a baby. Genes from you and your partner are forming the baby. The female genes determine whether the baby is a boy or a girl. At 6-8 weeks, the eyes and face are formed, and the heartbeat can be seen on ultrasound. At the end of 12 weeks, all the baby's organs are formed.  Now that you are pregnant, you will want to do everything you can to have a healthy baby. Two of the most important things are to get good prenatal care and to follow your health care provider's instructions. Prenatal care is all the medical care you receive before the baby's birth. This care will help prevent, find, and treat any problems during the pregnancy and childbirth. BODY CHANGES Your body goes through many changes during pregnancy. The changes vary from woman to woman.   You may gain or lose a couple of pounds at first.  You may feel sick to your stomach (nauseous) and throw up (vomit). If the vomiting is uncontrollable, call your health care provider.  You may tire easily.  You may develop headaches that can be relieved by medicines approved by your health care provider.  You may urinate more often. Painful urination may mean you have a bladder infection.  You may develop heartburn as a result of your pregnancy.  You may develop constipation because certain hormones are causing the muscles that push waste through your intestines to slow down.  You may develop hemorrhoids or swollen, bulging veins (varicose veins).  Your breasts may begin to grow larger and become tender. Your nipples may stick out more, and the tissue that surrounds them (areola) may become darker.  Your gums may bleed and may be sensitive to brushing and flossing.  Dark spots or blotches  (chloasma, mask of pregnancy) may develop on your face. This will likely fade after the baby is born.  Your menstrual periods will stop.  You may have a loss of appetite.  You may develop cravings for certain kinds of food.  You may have changes in your emotions from day to day, such as being excited to be pregnant or being concerned that something may go wrong with the pregnancy and baby.  You may have more vivid and strange dreams.  You may have changes in your hair. These can include thickening of your hair, rapid growth, and changes in texture. Some women also have hair loss during or after pregnancy, or hair that feels dry or thin. Your hair will most likely return to normal after your baby is born. WHAT TO EXPECT AT YOUR PRENATAL VISITS During a routine prenatal visit:  You will be weighed to make sure you and the baby are growing normally.  Your blood pressure will be taken.  Your abdomen will be measured to track your baby's growth.  The fetal heartbeat will be listened to starting around week 10 or 12 of your pregnancy.  Test results from any previous visits will be discussed. Your health care provider may ask you:  How you are feeling.  If you are feeling the baby move.  If you have had any abnormal symptoms, such as leaking fluid, bleeding, severe headaches, or abdominal cramping.  If you are using any tobacco products,   including cigarettes, chewing tobacco, and electronic cigarettes.  If you have any questions. Other tests that may be performed during your first trimester include:  Blood tests to find your blood type and to check for the presence of any previous infections. They will also be used to check for low iron levels (anemia) and Rh antibodies. Later in the pregnancy, blood tests for diabetes will be done along with other tests if problems develop.  Urine tests to check for infections, diabetes, or protein in the urine.  An ultrasound to confirm the  proper growth and development of the baby.  An amniocentesis to check for possible genetic problems.  Fetal screens for spina bifida and Down syndrome.  You may need other tests to make sure you and the baby are doing well.  HIV (human immunodeficiency virus) testing. Routine prenatal testing includes screening for HIV, unless you choose not to have this test. HOME CARE INSTRUCTIONS  Medicines  Follow your health care provider's instructions regarding medicine use. Specific medicines may be either safe or unsafe to take during pregnancy.  Take your prenatal vitamins as directed.  If you develop constipation, try taking a stool softener if your health care provider approves. Diet  Eat regular, well-balanced meals. Choose a variety of foods, such as meat or vegetable-based protein, fish, milk and low-fat dairy products, vegetables, fruits, and whole grain breads and cereals. Your health care provider will help you determine the amount of weight gain that is right for you.  Avoid raw meat and uncooked cheese. These carry germs that can cause birth defects in the baby.  Eating four or five small meals rather than three large meals a day may help relieve nausea and vomiting. If you start to feel nauseous, eating a few soda crackers can be helpful. Drinking liquids between meals instead of during meals also seems to help nausea and vomiting.  If you develop constipation, eat more high-fiber foods, such as fresh vegetables or fruit and whole grains. Drink enough fluids to keep your urine clear or pale yellow. Activity and Exercise  Exercise only as directed by your health care provider. Exercising will help you:  Control your weight.  Stay in shape.  Be prepared for labor and delivery.  Experiencing pain or cramping in the lower abdomen or low back is a good sign that you should stop exercising. Check with your health care provider before continuing normal exercises.  Try to avoid  standing for long periods of time. Move your legs often if you must stand in one place for a long time.  Avoid heavy lifting.  Wear low-heeled shoes, and practice good posture.  You may continue to have sex unless your health care provider directs you otherwise. Relief of Pain or Discomfort  Wear a good support bra for breast tenderness.   Take warm sitz baths to soothe any pain or discomfort caused by hemorrhoids. Use hemorrhoid cream if your health care provider approves.   Rest with your legs elevated if you have leg cramps or low back pain.  If you develop varicose veins in your legs, wear support hose. Elevate your feet for 15 minutes, 3-4 times a day. Limit salt in your diet. Prenatal Care  Schedule your prenatal visits by the twelfth week of pregnancy. They are usually scheduled monthly at first, then more often in the last 2 months before delivery.  Write down your questions. Take them to your prenatal visits.  Keep all your prenatal visits as directed by your   health care provider. Safety  Wear your seat belt at all times when driving.  Make a list of emergency phone numbers, including numbers for family, friends, the hospital, and police and fire departments. General Tips  Ask your health care provider for a referral to a local prenatal education class. Begin classes no later than at the beginning of month 6 of your pregnancy.  Ask for help if you have counseling or nutritional needs during pregnancy. Your health care provider can offer advice or refer you to specialists for help with various needs.  Do not use hot tubs, steam rooms, or saunas.  Do not douche or use tampons or scented sanitary pads.  Do not cross your legs for long periods of time.  Avoid cat litter boxes and soil used by cats. These carry germs that can cause birth defects in the baby and possibly loss of the fetus by miscarriage or stillbirth.  Avoid all smoking, herbs, alcohol, and medicines  not prescribed by your health care provider. Chemicals in these affect the formation and growth of the baby.  Do not use any tobacco products, including cigarettes, chewing tobacco, and electronic cigarettes. If you need help quitting, ask your health care provider. You may receive counseling support and other resources to help you quit.  Schedule a dentist appointment. At home, brush your teeth with a soft toothbrush and be gentle when you floss. SEEK MEDICAL CARE IF:   You have dizziness.  You have mild pelvic cramps, pelvic pressure, or nagging pain in the abdominal area.  You have persistent nausea, vomiting, or diarrhea.  You have a bad smelling vaginal discharge.  You have pain with urination.  You notice increased swelling in your face, hands, legs, or ankles. SEEK IMMEDIATE MEDICAL CARE IF:   You have a fever.  You are leaking fluid from your vagina.  You have spotting or bleeding from your vagina.  You have severe abdominal cramping or pain.  You have rapid weight gain or loss.  You vomit blood or material that looks like coffee grounds.  You are exposed to German measles and have never had them.  You are exposed to fifth disease or chickenpox.  You develop a severe headache.  You have shortness of breath.  You have any kind of trauma, such as from a fall or a car accident.   This information is not intended to replace advice given to you by your health care provider. Make sure you discuss any questions you have with your health care provider.   Document Released: 03/20/2001 Document Revised: 04/16/2014 Document Reviewed: 02/03/2013 Elsevier Interactive Patient Education 2016 [employer]  Contraception Choices Contraception (birth control) is the use of any methods or devices to prevent pregnancy. Below are some methods to help avoid pregnancy. HORMONAL METHODS   Contraceptive implant. This is a thin, plastic tube containing progesterone hormone. It does  not contain estrogen hormone. Your health care provider inserts the tube in the inner part of the upper arm. The tube can remain in place for up to 3 years. After 3 years, the implant must be removed. The implant prevents the ovaries from releasing an egg (ovulation), thickens the cervical mucus to prevent sperm from entering the uterus, and thins the lining of the inside of the uterus.  Progesterone-only injections. These injections are given every 3 months by your health care provider to prevent pregnancy. This synthetic progesterone hormone stops the ovaries from releasing eggs. It also thickens cervical mucus and changes the   uterine lining. This makes it harder for sperm to survive in the uterus.  Birth control pills. These pills contain estrogen and progesterone hormone. They work by preventing the ovaries from releasing eggs (ovulation). They also cause the cervical mucus to thicken, preventing the sperm from entering the uterus. Birth control pills are prescribed by a health care provider.Birth control pills can also be used to treat heavy periods.  Minipill. This type of birth control pill contains only the progesterone hormone. They are taken every day of each month and must be prescribed by your health care provider.  Birth control patch. The patch contains hormones similar to those in birth control pills. It must be changed once a week and is prescribed by a health care provider.  Vaginal ring. The ring contains hormones similar to those in birth control pills. It is left in the vagina for 3 weeks, removed for 1 week, and then a new one is put back in place. The patient must be comfortable inserting and removing the ring from the vagina.A health care provider's prescription is necessary.  Emergency contraception. Emergency contraceptives prevent pregnancy after unprotected sexual intercourse. This pill can be taken right after sex or up to 5 days after unprotected sex. It is most effective  the sooner you take the pills after having sexual intercourse. Most emergency contraceptive pills are available without a prescription. Check with your pharmacist. Do not use emergency contraception as your only form of birth control. BARRIER METHODS   Female condom. This is a thin sheath (latex or rubber) that is worn over the penis during sexual intercourse. It can be used with spermicide to increase effectiveness.  Female condom. This is a soft, loose-fitting sheath that is put into the vagina before sexual intercourse.  Diaphragm. This is a soft, latex, dome-shaped barrier that must be fitted by a health care provider. It is inserted into the vagina, along with a spermicidal jelly. It is inserted before intercourse. The diaphragm should be left in the vagina for 6 to 8 hours after intercourse.  Cervical cap. This is a round, soft, latex or plastic cup that fits over the cervix and must be fitted by a health care provider. The cap can be left in place for up to 48 hours after intercourse.  Sponge. This is a soft, circular piece of polyurethane foam. The sponge has spermicide in it. It is inserted into the vagina after wetting it and before sexual intercourse.  Spermicides. These are chemicals that kill or block sperm from entering the cervix and uterus. They come in the form of creams, jellies, suppositories, foam, or tablets. They do not require a prescription. They are inserted into the vagina with an applicator before having sexual intercourse. The process must be repeated every time you have sexual intercourse. INTRAUTERINE CONTRACEPTION  Intrauterine device (IUD). This is a T-shaped device that is put in a woman's uterus during a menstrual period to prevent pregnancy. There are 2 types:  Copper IUD. This type of IUD is wrapped in copper wire and is placed inside the uterus. Copper makes the uterus and fallopian tubes produce a fluid that kills sperm. It can stay in place for 10  years.  Hormone IUD. This type of IUD contains the hormone progestin (synthetic progesterone). The hormone thickens the cervical mucus and prevents sperm from entering the uterus, and it also thins the uterine lining to prevent implantation of a fertilized egg. The hormone can weaken or kill the sperm that get into the   uterus. It can stay in place for 3-5 years, depending on which type of IUD is used. PERMANENT METHODS OF CONTRACEPTION  Female tubal ligation. This is when the woman's fallopian tubes are surgically sealed, tied, or blocked to prevent the egg from traveling to the uterus.  Hysteroscopic sterilization. This involves placing a small coil or insert into each fallopian tube. Your doctor uses a technique called hysteroscopy to do the procedure. The device causes scar tissue to form. This results in permanent blockage of the fallopian tubes, so the sperm cannot fertilize the egg. It takes about 3 months after the procedure for the tubes to become blocked. You must use another form of birth control for these 3 months.  Female sterilization. This is when the female has the tubes that carry sperm tied off (vasectomy).This blocks sperm from entering the vagina during sexual intercourse. After the procedure, the man can still ejaculate fluid (semen). NATURAL PLANNING METHODS  Natural family planning. This is not having sexual intercourse or using a barrier method (condom, diaphragm, cervical cap) on days the woman could become pregnant.  Calendar method. This is keeping track of the length of each menstrual cycle and identifying when you are fertile.  Ovulation method. This is avoiding sexual intercourse during ovulation.  Symptothermal method. This is avoiding sexual intercourse during ovulation, using a thermometer and ovulation symptoms.  Post-ovulation method. This is timing sexual intercourse after you have ovulated. Regardless of which type or method of contraception you choose, it is  important that you use condoms to protect against the transmission of sexually transmitted infections (STIs). Talk with your health care provider about which form of contraception is most appropriate for you.   This information is not intended to replace advice given to you by your health care provider. Make sure you discuss any questions you have with your health care provider.   Document Released: 03/26/2005 Document Revised: 03/31/2013 Document Reviewed: 09/18/2012 Elsevier Interactive Patient Education 2016 [employer]  Breastfeeding Deciding to breastfeed is one of the best choices you can make for you and your baby. A change in hormones during pregnancy causes your breast tissue to grow and increases the number and size of your milk ducts. These hormones also allow proteins, sugars, and fats from your blood supply to make breast milk in your milk-producing glands. Hormones prevent breast milk from being released before your baby is born as well as prompt milk flow after birth. Once breastfeeding has begun, thoughts of your baby, as well as his or her sucking or crying, can stimulate the release of milk from your milk-producing glands.  BENEFITS OF BREASTFEEDING For Your Baby  Your first milk (colostrum) helps your baby's digestive system function better.  There are antibodies in your milk that help your baby fight off infections.  Your baby has a lower incidence of asthma, allergies, and sudden infant death syndrome.  The nutrients in breast milk are better for your baby than infant formulas and are designed uniquely for your baby's needs.  Breast milk improves your baby's brain development.  Your baby is less likely to develop other conditions, such as childhood obesity, asthma, or type 2 diabetes mellitus. For You  Breastfeeding helps to create a very special bond between you and your baby.  Breastfeeding is convenient. Breast milk is always available at the correct temperature  and costs nothing.  Breastfeeding helps to burn calories and helps you lose the weight gained during pregnancy.  Breastfeeding makes your uterus contract to its   prepregnancy size faster and slows bleeding (lochia) after you give birth.   Breastfeeding helps to lower your risk of developing type 2 diabetes mellitus, osteoporosis, and breast or ovarian cancer later in life. SIGNS THAT YOUR BABY IS HUNGRY Early Signs of Hunger  Increased alertness or activity.  Stretching.  Movement of the head from side to side.  Movement of the head and opening of the mouth when the corner of the mouth or cheek is stroked (rooting).  Increased sucking sounds, smacking lips, cooing, sighing, or squeaking.  Hand-to-mouth movements.  Increased sucking of fingers or hands. Late Signs of Hunger  Fussing.  Intermittent crying. Extreme Signs of Hunger Signs of extreme hunger will require calming and consoling before your baby will be able to breastfeed successfully. Do not wait for the following signs of extreme hunger to occur before you initiate breastfeeding:  Restlessness.  A loud, strong cry.  Screaming. BREASTFEEDING BASICS Breastfeeding Initiation  Find a comfortable place to sit or lie down, with your neck and back well supported.  Place a pillow or rolled up blanket under your baby to bring him or her to the level of your breast (if you are seated). Nursing pillows are specially designed to help support your arms and your baby while you breastfeed.  Make sure that your baby's abdomen is facing your abdomen.  Gently massage your breast. With your fingertips, massage from your chest wall toward your nipple in a circular motion. This encourages milk flow. You may need to continue this action during the feeding if your milk flows slowly.  Support your breast with 4 fingers underneath and your thumb above your nipple. Make sure your fingers are well away from your nipple and your baby's  mouth.  Stroke your baby's lips gently with your finger or nipple.  When your baby's mouth is open wide enough, quickly bring your baby to your breast, placing your entire nipple and as much of the colored area around your nipple (areola) as possible into your baby's mouth.  More areola should be visible above your baby's upper lip than below the lower lip.  Your baby's tongue should be between his or her lower gum and your breast.  Ensure that your baby's mouth is correctly positioned around your nipple (latched). Your baby's lips should create a seal on your breast and be turned out (everted).  It is common for your baby to suck about 2-3 minutes in order to start the flow of breast milk. Latching Teaching your baby how to latch on to your breast properly is very important. An improper latch can cause nipple pain and decreased milk supply for you and poor weight gain in your baby. Also, if your baby is not latched onto your nipple properly, he or she may swallow some air during feeding. This can make your baby fussy. Burping your baby when you switch breasts during the feeding can help to get rid of the air. However, teaching your baby to latch on properly is still the best way to prevent fussiness from swallowing air while breastfeeding. Signs that your baby has successfully latched on to your nipple:  Silent tugging or silent sucking, without causing you pain.  Swallowing heard between every 3-4 sucks.  Muscle movement above and in front of his or her ears while sucking. Signs that your baby has not successfully latched on to nipple:  Sucking sounds or smacking sounds from your baby while breastfeeding.  Nipple pain. If you think your baby has   not latched on correctly, slip your finger into the corner of your baby's mouth to break the suction and place it between your baby's gums. Attempt breastfeeding initiation again. Signs of Successful Breastfeeding Signs from your baby:  A  gradual decrease in the number of sucks or complete cessation of sucking.  Falling asleep.  Relaxation of his or her body.  Retention of a small amount of milk in his or her mouth.  Letting go of your breast by himself or herself. Signs from you:  Breasts that have increased in firmness, weight, and size 1-3 hours after feeding.  Breasts that are softer immediately after breastfeeding.  Increased milk volume, as well as a change in milk consistency and color by the fifth day of breastfeeding.  Nipples that are not sore, cracked, or bleeding. Signs That Your Baby is Getting Enough Milk  Wetting at least 3 diapers in a 24-hour period. The urine should be clear and pale yellow by age 5 days.  At least 3 stools in a 24-hour period by age 5 days. The stool should be soft and yellow.  At least 3 stools in a 24-hour period by age 7 days. The stool should be seedy and yellow.  No loss of weight greater than 10% of birth weight during the first 3 days of age.  Average weight gain of 4-7 ounces (113-198 g) per week after age 4 days.  Consistent daily weight gain by age 5 days, without weight loss after the age of 2 weeks. After a feeding, your baby may spit up a small amount. This is common. BREASTFEEDING FREQUENCY AND DURATION Frequent feeding will help you make more milk and can prevent sore nipples and breast engorgement. Breastfeed when you feel the need to reduce the fullness of your breasts or when your baby shows signs of hunger. This is called "breastfeeding on demand." Avoid introducing a pacifier to your baby while you are working to establish breastfeeding (the first 4-6 weeks after your baby is born). After this time you may choose to use a pacifier. Research has shown that pacifier use during the first year of a baby's life decreases the risk of sudden infant death syndrome (SIDS). Allow your baby to feed on each breast as long as he or she wants. Breastfeed until your baby is  finished feeding. When your baby unlatches or falls asleep while feeding from the first breast, offer the second breast. Because newborns are often sleepy in the first few weeks of life, you may need to awaken your baby to get him or her to feed. Breastfeeding times will vary from baby to baby. However, the following rules can serve as a guide to help you ensure that your baby is properly fed:  Newborns (babies 4 weeks of age or younger) may breastfeed every 1-3 hours.  Newborns should not go longer than 3 hours during the day or 5 hours during the night without breastfeeding.  You should breastfeed your baby a minimum of 8 times in a 24-hour period until you begin to introduce solid foods to your baby at around 6 months of age. BREAST MILK PUMPING Pumping and storing breast milk allows you to ensure that your baby is exclusively fed your breast milk, even at times when you are unable to breastfeed. This is especially important if you are going back to work while you are still breastfeeding or when you are not able to be present during feedings. Your lactation consultant can give you guidelines on   how long it is safe to store breast milk. A breast pump is a machine that allows you to pump milk from your breast into a sterile bottle. The pumped breast milk can then be stored in a refrigerator or freezer. Some breast pumps are operated by hand, while others use electricity. Ask your lactation consultant which type will work best for you. Breast pumps can be purchased, but some hospitals and breastfeeding support groups lease breast pumps on a monthly basis. A lactation consultant can teach you how to hand express breast milk, if you prefer not to use a pump. CARING FOR YOUR BREASTS WHILE YOU BREASTFEED Nipples can become dry, cracked, and sore while breastfeeding. The following recommendations can help keep your breasts moisturized and healthy:  Avoid using soap on your nipples.  Wear a supportive bra.  Although not required, special nursing bras and tank tops are designed to allow access to your breasts for breastfeeding without taking off your entire bra or top. Avoid wearing underwire-style bras or extremely tight bras.  Air dry your nipples for 3-4minutes after each feeding.  Use only cotton bra pads to absorb leaked breast milk. Leaking of breast milk between feedings is normal.  Use lanolin on your nipples after breastfeeding. Lanolin helps to maintain your skin's normal moisture barrier. If you use pure lanolin, you do not need to wash it off before feeding your baby again. Pure lanolin is not toxic to your baby. You may also hand express a few drops of breast milk and gently massage that milk into your nipples and allow the milk to air dry. In the first few weeks after giving birth, some women experience extremely full breasts (engorgement). Engorgement can make your breasts feel heavy, warm, and tender to the touch. Engorgement peaks within 3-5 days after you give birth. The following recommendations can help ease engorgement:  Completely empty your breasts while breastfeeding or pumping. You may want to start by applying warm, moist heat (in the shower or with warm water-soaked hand towels) just before feeding or pumping. This increases circulation and helps the milk flow. If your baby does not completely empty your breasts while breastfeeding, pump any extra milk after he or she is finished.  Wear a snug bra (nursing or regular) or tank top for 1-2 days to signal your body to slightly decrease milk production.  Apply ice packs to your breasts, unless this is too uncomfortable for you.  Make sure that your baby is latched on and positioned properly while breastfeeding. If engorgement persists after 48 hours of following these recommendations, contact your health care provider or a lactation consultant. OVERALL HEALTH CARE RECOMMENDATIONS WHILE BREASTFEEDING  Eat healthy foods.  Alternate between meals and snacks, eating 3 of each per day. Because what you eat affects your breast milk, some of the foods may make your baby more irritable than usual. Avoid eating these foods if you are sure that they are negatively affecting your baby.  Drink milk, fruit juice, and water to satisfy your thirst (about 10 glasses a day).  Rest often, relax, and continue to take your prenatal vitamins to prevent fatigue, stress, and anemia.  Continue breast self-awareness checks.  Avoid chewing and smoking tobacco. Chemicals from cigarettes that pass into breast milk and exposure to secondhand smoke may harm your baby.  Avoid alcohol and drug use, including marijuana. Some medicines that may be harmful to your baby can pass through breast milk. It is important to ask your health care provider   before taking any medicine, including all over-the-counter and prescription medicine as well as vitamin and herbal supplements. It is possible to become pregnant while breastfeeding. If birth control is desired, ask your health care provider about options that will be safe for your baby. SEEK MEDICAL CARE IF:  You feel like you want to stop breastfeeding or have become frustrated with breastfeeding.  You have painful breasts or nipples.  Your nipples are cracked or bleeding.  Your breasts are red, tender, or warm.  You have a swollen area on either breast.  You have a fever or chills.  You have nausea or vomiting.  You have drainage other than breast milk from your nipples.  Your breasts do not become full before feedings by the fifth day after you give birth.  You feel sad and depressed.  Your baby is too sleepy to eat well.  Your baby is having trouble sleeping.   Your baby is wetting less than 3 diapers in a 24-hour period.  Your baby has less than 3 stools in a 24-hour period.  Your baby's skin or the white part of his or her eyes becomes yellow.   Your baby is not gaining  weight by 5 days of age. SEEK IMMEDIATE MEDICAL CARE IF:  Your baby is overly tired (lethargic) and does not want to wake up and feed.  Your baby develops an unexplained fever.   This information is not intended to replace advice given to you by your health care provider. Make sure you discuss any questions you have with your health care provider.   Document Released: 03/26/2005 Document Revised: 12/15/2014 Document Reviewed: 09/17/2012 Elsevier Interactive Patient Education 2016 Elsevier Inc.  

## 2016-01-12 NOTE — Addendum Note (Signed)
Addended by: Catalina AntiguaONSTANT, Simona Rocque on: 01/12/2016 10:03 AM   Modules accepted: Orders

## 2016-01-13 LAB — GC/CHLAMYDIA PROBE AMP (~~LOC~~) NOT AT ARMC
Chlamydia: NEGATIVE
NEISSERIA GONORRHEA: NEGATIVE

## 2016-01-15 LAB — URINE CULTURE, OB REFLEX

## 2016-01-15 LAB — CULTURE, OB URINE

## 2016-01-16 ENCOUNTER — Other Ambulatory Visit: Payer: Self-pay | Admitting: Obstetrics and Gynecology

## 2016-01-16 MED ORDER — CEPHALEXIN 500 MG PO CAPS: 500.0000 mg | ORAL_CAPSULE | Freq: Four times a day (QID) | ORAL | 0 refills | Status: DC

## 2016-01-19 ENCOUNTER — Telehealth: Payer: Self-pay

## 2016-01-19 LAB — PRENATAL PROFILE I(LABCORP)
ANTIBODY SCREEN: NEGATIVE
BASOS: 0 %
Basophils Absolute: 0 10*3/uL (ref 0.0–0.2)
EOS (ABSOLUTE): 0.2 10*3/uL (ref 0.0–0.4)
Eos: 2 %
HEMOGLOBIN: 12.1 g/dL (ref 11.1–15.9)
HEP B S AG: NEGATIVE
Hematocrit: 34.9 % (ref 34.0–46.6)
IMMATURE GRANULOCYTES: 0 %
Immature Grans (Abs): 0 10*3/uL (ref 0.0–0.1)
LYMPHS ABS: 1.5 10*3/uL (ref 0.7–3.1)
Lymphs: 16 %
MCH: 31.4 pg (ref 26.6–33.0)
MCHC: 34.7 g/dL (ref 31.5–35.7)
MCV: 91 fL (ref 79–97)
MONOS ABS: 0.5 10*3/uL (ref 0.1–0.9)
Monocytes: 5 %
NEUTROS PCT: 77 %
Neutrophils Absolute: 7.3 10*3/uL — ABNORMAL HIGH (ref 1.4–7.0)
Platelets: 271 10*3/uL (ref 150–379)
RBC: 3.85 x10E6/uL (ref 3.77–5.28)
RDW: 14.7 % (ref 12.3–15.4)
RPR: NONREACTIVE
RUBELLA: 4.06 {index} (ref >0.99)
Rh Factor: POSITIVE
WBC: 9.5 10*3/uL (ref 3.4–10.8)

## 2016-01-19 LAB — HEMOGLOBINOPATHY EVALUATION
HGB A: 97.4 % (ref 94.0–98.0)
HGB C: 0 %
HGB S: 0 %
Hemoglobin A2 Quantitation: 2.6 % (ref 0.7–3.1)
Hemoglobin F Quantitation: 0 % (ref 0.0–2.0)

## 2016-01-19 LAB — VARICELLA ZOSTER ANTIBODY, IGG: Varicella zoster IgG: 559 index (ref >165)

## 2016-01-19 LAB — TOXASSURE SELECT 13 (MW), URINE

## 2016-01-19 LAB — HIV ANTIBODY (ROUTINE TESTING W REFLEX): HIV Screen 4th Generation wRfx: NONREACTIVE

## 2016-01-19 LAB — CYSTIC FIBROSIS MUTATION 97: Interpretation: NOT DETECTED

## 2016-01-19 NOTE — Telephone Encounter (Signed)
Attempted to contact pt and inform about rx that was sent to pharmacy, number on file is not accepting calls.

## 2016-01-21 ENCOUNTER — Encounter (HOSPITAL_COMMUNITY): Payer: Self-pay | Admitting: *Deleted

## 2016-01-21 ENCOUNTER — Inpatient Hospital Stay (HOSPITAL_COMMUNITY)
Admission: AD | Admit: 2016-01-21 | Discharge: 2016-01-21 | Disposition: A | Discharge status: Home / Self Care | Payer: Medicaid Other | Source: Ambulatory Visit | Attending: Obstetrics & Gynecology | Admitting: Obstetrics & Gynecology

## 2016-01-21 DIAGNOSIS — O219 Vomiting of pregnancy, unspecified: Secondary | ICD-10-CM

## 2016-01-21 DIAGNOSIS — O2341 Unspecified infection of urinary tract in pregnancy, first trimester: Secondary | ICD-10-CM | POA: Insufficient documentation

## 2016-01-21 DIAGNOSIS — O210 Mild hyperemesis gravidarum: Secondary | ICD-10-CM | POA: Diagnosis present

## 2016-01-21 DIAGNOSIS — Z3A11 11 weeks gestation of pregnancy: Secondary | ICD-10-CM | POA: Insufficient documentation

## 2016-01-21 LAB — URINALYSIS, ROUTINE W REFLEX MICROSCOPIC
BILIRUBIN URINE: NEGATIVE
GLUCOSE, UA: NEGATIVE mg/dL
Hgb urine dipstick: NEGATIVE
KETONES UR: 15 mg/dL — AB
NITRITE: POSITIVE — AB
PH: 6.5 (ref 5.0–8.0)
Protein, ur: NEGATIVE mg/dL
SPECIFIC GRAVITY, URINE: 1.01 (ref 1.005–1.030)

## 2016-01-21 LAB — URINE MICROSCOPIC-ADD ON

## 2016-01-21 MED ORDER — PROMETHAZINE HCL 25 MG PO TABS: 12.5000 mg | ORAL_TABLET | Freq: Four times a day (QID) | ORAL | 2 refills | Status: DC | PRN

## 2016-01-21 MED ORDER — ONDANSETRON 4 MG PO TBDP
4.0000 mg | ORAL_TABLET | Freq: Four times a day (QID) | ORAL | 0 refills | Status: DC | PRN
Start: 2016-01-21 — End: 2016-05-29

## 2016-01-21 NOTE — Discharge Instructions (Signed)
Morning Sickness Morning sickness is when you feel sick to your stomach (nauseous) during pregnancy. This nauseous feeling may or may not come with vomiting. It often occurs in the morning but can be a problem any time of day. Morning sickness is most common during the first trimester, but it may continue throughout pregnancy. While morning sickness is unpleasant, it is usually harmless unless you develop severe and continual vomiting (hyperemesis gravidarum). This condition requires more intense treatment.  CAUSES  The cause of morning sickness is not completely known but seems to be related to normal hormonal changes that occur in pregnancy. RISK FACTORS You are at greater risk if you:  Experienced nausea or vomiting before your pregnancy.  Had morning sickness during a previous pregnancy.  Are pregnant with more than one baby, such as twins. TREATMENT  Do not use any medicines (prescription, over-the-counter, or herbal) for morning sickness without first talking to your health care provider. Your health care provider may prescribe or recommend:  Vitamin B6 supplements.  Anti-nausea medicines.  The herbal medicine ginger. HOME CARE INSTRUCTIONS   Only take over-the-counter or prescription medicines as directed by your health care provider.  Taking multivitamins before getting pregnant can prevent or decrease the severity of morning sickness in most women.  Eat a piece of dry toast or unsalted crackers before getting out of bed in the morning.  Eat five or six small meals a day.  Eat dry and bland foods (rice, baked potato). Foods high in carbohydrates are often helpful.  Do not drink liquids with your meals. Drink liquids between meals.  Avoid greasy, fatty, and spicy foods.  Get someone to cook for you if the smell of any food causes nausea and vomiting.  If you feel nauseous after taking prenatal vitamins, take the vitamins at night or with a snack.  Snack on protein  foods (nuts, yogurt, cheese) between meals if you are hungry.  Eat unsweetened gelatins for desserts.  Wearing an acupressure wristband (worn for sea sickness) may be helpful.  Acupuncture may be helpful.  Do not smoke.  Get a humidifier to keep the air in your house free of odors.  Get plenty of fresh air. SEEK MEDICAL CARE IF:   Your home remedies are not working, and you need medicine.  You feel dizzy or lightheaded.  You are losing weight. SEEK IMMEDIATE MEDICAL CARE IF:   You have persistent and uncontrolled nausea and vomiting.  You pass out (faint). MAKE SURE YOU:  Understand these instructions.  Will watch your condition.  Will get help right away if you are not doing well or get worse.   This information is not intended to replace advice given to you by your health care provider. Make sure you discuss any questions you have with your health care provider.   Document Released: 05/17/2006 Document Revised: 03/31/2013 Document Reviewed: 09/10/2012 Elsevier Interactive Patient Education 2016 Elsevier Inc.  Pregnancy and Urinary Tract Infection A urinary tract infection (UTI) is a bacterial infection of the urinary tract. Infection of the urinary tract can include the ureters, kidneys (pyelonephritis), bladder (cystitis), and urethra (urethritis). All pregnant women should be screened for bacteria in the urinary tract. Identifying and treating a UTI will decrease the risk of preterm labor and developing more serious infections in both the mother and baby. CAUSES Bacteria germs cause almost all UTIs.  RISK FACTORS Many factors can increase your chances of getting a UTI during pregnancy. These include:  Having a short urethra.  Poor toilet and hygiene habits.  Sexual intercourse.  Blockage of urine along the urinary tract.  Problems with the pelvic muscles or nerves.  Diabetes.  Obesity.  Bladder problems after having several children.  Previous history  of UTI. SIGNS AND SYMPTOMS   Pain, burning, or a stinging feeling when urinating.  Suddenly feeling the need to urinate right away (urgency).  Loss of bladder control (urinary incontinence).  Frequent urination, more than is common with pregnancy.  Lower abdominal or back discomfort.  Cloudy urine.  Blood in the urine (hematuria).  Fever. When the kidneys are infected, the symptoms may be:  Back pain.  Flank pain on the right side more so than the left.  Fever.  Chills.  Nausea.  Vomiting. DIAGNOSIS  A urinary tract infection is usually diagnosed through urine tests. Additional tests and procedures are sometimes done. These may include:  Ultrasound exam of the kidneys, ureters, bladder, and urethra.  Looking in the bladder with a lighted tube (cystoscopy). TREATMENT Typically, UTIs can be treated with antibiotic medicines.  HOME CARE INSTRUCTIONS   Only take over-the-counter or prescription medicines as directed by your health care provider. If you were prescribed antibiotics, take them as directed. Finish them even if you start to feel better.  Drink enough fluids to keep your urine clear or pale yellow.  Do not have sexual intercourse until the infection is gone and your health care provider says it is okay.  Make sure you are tested for UTIs throughout your pregnancy. These infections often come back. Preventing a UTI in the Future  Practice good toilet habits. Always wipe from front to back. Use the tissue only once.  Do not hold your urine. Empty your bladder as soon as possible when the urge comes.  Do not douche or use deodorant sprays.  Wash with soap and warm water around the genital area and the anus.  Empty your bladder before and after sexual intercourse.  Wear underwear with a cotton crotch.  Avoid caffeine and carbonated drinks. They can irritate the bladder.  Drink cranberry juice or take cranberry pills. This may decrease the risk of  getting a UTI.  Do not drink alcohol.  Keep all your appointments and tests as scheduled. SEEK MEDICAL CARE IF:   Your symptoms get worse.  You are still having fevers 2 or more days after treatment begins.  You have a rash.  You feel that you are having problems with medicines prescribed.  You have abnormal vaginal discharge. SEEK IMMEDIATE MEDICAL CARE IF:   You have back or flank pain.  You have chills.  You have blood in your urine.  You have nausea and vomiting.  You have contractions of your uterus.  You have a gush of fluid from the vagina. MAKE SURE YOU:  Understand these instructions.   Will watch your condition.   Will get help right away if you are not doing well or get worse.    This information is not intended to replace advice given to you by your health care provider. Make sure you discuss any questions you have with your health care provider.   Document Released: 07/21/2010 Document Revised: 01/14/2013 Document Reviewed: 10/23/2012 Elsevier Interactive Patient Education [employer]

## 2016-01-21 NOTE — Progress Notes (Signed)
Lisa Leftwich-Kirby CNM in earlier to discuss test results and d/c plan. Written and verbal d/c instructions given and understanding voiced. 

## 2016-01-21 NOTE — MAU Provider Note (Signed)
Chief Complaint: Emesis During Pregnancy   First Provider Initiated Contact with Patient 01/21/16 0317      SUBJECTIVE HPI: Holly Greene is a 20 y.o. G1P0 at [redacted]w[redacted]d by LMP who presents to maternity admissions via EMS reporting an episode of vomiting that started after dinner tonight, reporting vomiting 5-6 times. The vomiting started out as food but then she saw bright red streaks of blood so she called EMS to come to the hospital. She reports nausea daily with some occasional vomiting in the pregnancy but none as bad as today. She does not have any nausea medications at home and has not taken anything.  Eating made her symptoms worse. Nothing makes it better.  She denies sick contacts. She denies heartburn. She denies vaginal bleeding, vaginal itching/burning, urinary symptoms, h/a, dizziness, or fever/chills.    Pt received Zofran 4 mg ODT from EMS prior to arrival in MAU.  She reports significantly less nausea by the time she arrives.   HPI  Past Medical History:  Diagnosis Date  . Asthma    as a child  . Bipolar 1 disorder (HCC)   . Defiant behavior   . Eczema    Past Surgical History:  Procedure Laterality Date  . TONSILLECTOMY     Social History   Social History  . Marital status: Single    Spouse name: N/A  . Number of children: N/A  . Years of education: N/A   Occupational History  . Not on file.   Social History Main Topics  . Smoking status: Never Smoker  . Smokeless tobacco: Never Used  . Alcohol use No  . Drug use:     Types: Marijuana     Comment: last smoked about 2mos ago  . Sexual activity: Yes   Other Topics Concern  . Not on file   Social History Narrative  . No narrative on file   No current facility-administered medications on file prior to encounter.    Current Outpatient Prescriptions on File Prior to Encounter  Medication Sig Dispense Refill  . Prenatal Vit-Fe Fumarate-FA (PRENATAL MULTIVITAMIN) TABS tablet Take 1 tablet by mouth  daily at 12 noon.    Marland Kitchen albuterol (PROVENTIL HFA;VENTOLIN HFA) 108 (90 Base) MCG/ACT inhaler Inhale 2 puffs into the lungs every 6 (six) hours as needed for wheezing or shortness of breath. 1 Inhaler 2  . cephALEXin (KEFLEX) 500 MG capsule Take 1 capsule (500 mg total) by mouth 4 (four) times daily. 28 capsule 0  . guaiFENesin (MUCINEX) 600 MG 12 hr tablet Take 1 tablet (600 mg total) by mouth 2 (two) times daily. (Patient not taking: Reported on 01/12/2016) 30 tablet 1  . triamcinolone ointment (KENALOG) 0.1 % Apply 1 application topically 2 (two) times daily. Use for 1-2 weeks (Patient not taking: Reported on 01/12/2016) 30 g 0  . [DISCONTINUED] hydrALAZINE (APRESOLINE) 10 MG tablet Take 1 tablet (10 mg total) by mouth 3 (three) times daily. 30 tablet 0   No Known Allergies  ROS:  Review of Systems  Constitutional: Negative for chills, fatigue and fever.  Respiratory: Negative for shortness of breath.   Cardiovascular: Negative for chest pain.  Gastrointestinal: Positive for nausea and vomiting.  Genitourinary: Negative for difficulty urinating, dysuria, flank pain, pelvic pain, vaginal bleeding, vaginal discharge and vaginal pain.  Neurological: Negative for dizziness and headaches.  Psychiatric/Behavioral: Negative.      I have reviewed patient's Past Medical Hx, Surgical Hx, Family Hx, Social Hx, medications and allergies.   Physical Exam  Patient Vitals for the past 24 hrs:  BP Temp Pulse Resp Height Weight  01/21/16 0215 121/68 97.9 F (36.6 C) 78 18 5\' 8"  (1.727 m) 183 lb (83 kg)   Constitutional: Well-developed, well-nourished female in no acute distress.  Cardiovascular: normal rate Respiratory: normal effort GI: Abd soft, non-tender. Pos BS x 4 MS: Extremities nontender, no edema, normal ROM Neurologic: Alert and oriented x 4.  GU: Neg CVAT.   FHT 145 by doppler  LAB RESULTS Results for orders placed or performed during the hospital encounter of 01/21/16 (from the  past 24 hour(s))  Urinalysis, Routine w reflex microscopic ([facility])     Status: Abnormal   Collection Time: 01/21/16  3:25 AM  Result Value Ref Range   Color, Urine YELLOW YELLOW   APPearance CLEAR CLEAR   Specific Gravity, Urine 1.010 1.005 - 1.030   pH 6.5 5.0 - 8.0   Glucose, UA NEGATIVE NEGATIVE mg/dL   Hgb urine dipstick NEGATIVE NEGATIVE   Bilirubin Urine NEGATIVE NEGATIVE   Ketones, ur 15 (A) NEGATIVE mg/dL   Protein, ur NEGATIVE NEGATIVE mg/dL   Nitrite POSITIVE (A) NEGATIVE   Leukocytes, UA MODERATE (A) NEGATIVE  Urine microscopic-add on     Status: Abnormal   Collection Time: 01/21/16  3:25 AM  Result Value Ref Range   Squamous Epithelial / LPF 6-30 (A) NONE SEEN   WBC, UA 6-30 0 - 5 WBC/hpf   RBC / HPF 0-5 0 - 5 RBC/hpf   Bacteria, UA MANY (A) NONE SEEN    O/Positive/-- (10/05 1049)  IMAGING No results found.  MAU Management/MDM: Ordered labs and reviewed results.  Mild dehydration today.  Pt tolerating PO fluids in MAU.  Rx for Zofran 4 mg ODT Q 8 hours and Phenergan 12.5-25 mg PO Q 6 hours.  Discussed UTI with pt, let her know Keflex was also called in to her pharmacy from her previous office visit.  F/U with [facility] as scheduled. Pt stable at time of discharge.  ASSESSMENT 1. UTI (urinary tract infection) during pregnancy, first trimester   2. Nausea and vomiting during pregnancy prior to [redacted] weeks gestation     PLAN Discharge home   Medication List    STOP taking these medications   guaiFENesin 600 MG 12 hr tablet Commonly known as:  MUCINEX   triamcinolone ointment 0.1 % Commonly known as:  KENALOG     TAKE these medications   albuterol 108 (90 Base) MCG/ACT inhaler Commonly known as:  PROVENTIL HFA;VENTOLIN HFA Inhale 2 puffs into the lungs every 6 (six) hours as needed for wheezing or shortness of breath.   cephALEXin 500 MG capsule Commonly known as:  KEFLEX Take 1 capsule (500 mg total) by mouth 4 (four) times daily.   ondansetron 4 MG  disintegrating tablet Commonly known as:  ZOFRAN ODT Take 1 tablet (4 mg total) by mouth every 6 (six) hours as needed for nausea.   prenatal multivitamin Tabs tablet Take 1 tablet by mouth daily at 12 noon.   promethazine 25 MG tablet Commonly known as:  PHENERGAN Take 0.5-1 tablets (12.5-25 mg total) by mouth every 6 (six) hours as needed.      Follow-up Information    [facility] .   Why:  As scheduled, return to MAU as needed for emergencies Contact information: [address] [place] [phone]          Sharen CounterLisa Leftwich-Kirby Certified Nurse-Midwife 01/21/2016  4:15  AM    

## 2016-01-21 NOTE — MAU Note (Signed)
Vomited several times in an hour and saw blood in emesis. Has had n/v this pregnancy but no meds at home for n/v. No diarrhea

## 2016-01-24 ENCOUNTER — Encounter (HOSPITAL_COMMUNITY): Payer: Self-pay | Admitting: Obstetrics and Gynecology

## 2016-01-26 ENCOUNTER — Other Ambulatory Visit: Payer: Self-pay

## 2016-01-26 ENCOUNTER — Telehealth: Payer: Self-pay

## 2016-01-26 ENCOUNTER — Telehealth: Payer: Self-pay | Admitting: *Deleted

## 2016-01-26 NOTE — Telephone Encounter (Signed)
S/w patient's mother and left message that she did not need to come to appt today. Mother stated that she would have pt call me.

## 2016-01-26 NOTE — Telephone Encounter (Signed)
Patient had called to speak to Brittney - she has talked to her already.

## 2016-01-26 NOTE — Telephone Encounter (Signed)
S/w pt and advised of appt status and verified that previous rx was received.

## 2016-01-31 ENCOUNTER — Other Ambulatory Visit: Payer: Self-pay | Admitting: Obstetrics and Gynecology

## 2016-01-31 ENCOUNTER — Ambulatory Visit (HOSPITAL_COMMUNITY): Admission: RE | Admit: 2016-01-31 | Payer: Medicaid Other | Source: Ambulatory Visit

## 2016-01-31 ENCOUNTER — Ambulatory Visit (HOSPITAL_COMMUNITY)
Admission: RE | Admit: 2016-01-31 | Discharge: 2016-01-31 | Disposition: A | Discharge status: Home / Self Care | Payer: Medicaid Other | Source: Ambulatory Visit | Attending: Obstetrics and Gynecology | Admitting: Obstetrics and Gynecology

## 2016-01-31 ENCOUNTER — Other Ambulatory Visit (HOSPITAL_COMMUNITY): Payer: Self-pay | Admitting: *Deleted

## 2016-01-31 ENCOUNTER — Encounter (HOSPITAL_COMMUNITY): Payer: Self-pay

## 2016-01-31 DIAGNOSIS — Z3682 Encounter for antenatal screening for nuchal translucency: Secondary | ICD-10-CM

## 2016-01-31 DIAGNOSIS — Z3400 Encounter for supervision of normal first pregnancy, unspecified trimester: Secondary | ICD-10-CM

## 2016-01-31 DIAGNOSIS — Z3A13 13 weeks gestation of pregnancy: Secondary | ICD-10-CM | POA: Insufficient documentation

## 2016-01-31 NOTE — Addendum Note (Signed)
Encounter addended by: Lenoard Adenlivia M Bitania Shankland, RT on: 01/31/2016 11:22 AM<BR>    Actions taken: Imaging Exam ended

## 2016-02-02 ENCOUNTER — Encounter (HOSPITAL_COMMUNITY): Payer: Self-pay

## 2016-02-02 ENCOUNTER — Ambulatory Visit (HOSPITAL_COMMUNITY)
Admission: RE | Admit: 2016-02-02 | Discharge: 2016-02-02 | Disposition: A | Discharge status: Home / Self Care | Payer: Medicaid Other | Source: Ambulatory Visit | Attending: Obstetrics and Gynecology | Admitting: Obstetrics and Gynecology

## 2016-02-02 DIAGNOSIS — Z3682 Encounter for antenatal screening for nuchal translucency: Secondary | ICD-10-CM | POA: Insufficient documentation

## 2016-02-02 DIAGNOSIS — Z3A13 13 weeks gestation of pregnancy: Secondary | ICD-10-CM | POA: Diagnosis not present

## 2016-02-02 DIAGNOSIS — Z3400 Encounter for supervision of normal first pregnancy, unspecified trimester: Secondary | ICD-10-CM

## 2016-02-09 ENCOUNTER — Encounter: Payer: Self-pay | Admitting: Obstetrics & Gynecology

## 2016-02-13 ENCOUNTER — Other Ambulatory Visit (HOSPITAL_COMMUNITY): Payer: Self-pay

## 2016-03-06 ENCOUNTER — Ambulatory Visit (INDEPENDENT_AMBULATORY_CARE_PROVIDER_SITE_OTHER): Payer: Self-pay | Admitting: Obstetrics & Gynecology

## 2016-03-06 DIAGNOSIS — Z3400 Encounter for supervision of normal first pregnancy, unspecified trimester: Secondary | ICD-10-CM

## 2016-03-06 DIAGNOSIS — Z3402 Encounter for supervision of normal first pregnancy, second trimester: Secondary | ICD-10-CM

## 2016-03-06 NOTE — Patient Instructions (Signed)
Second Trimester of Pregnancy The second trimester is from week 13 through week 28, month 4 through 6. This is often the time in pregnancy that you feel your best. Often times, morning sickness has lessened or quit. You may have more energy, and you may get hungry more often. Your unborn baby (fetus) is growing rapidly. At the end of the sixth month, he or she is about 9 inches long and weighs about 1 pounds. You will likely feel the baby move (quickening) between 18 and 20 weeks of pregnancy. Follow these instructions at home:  Avoid all smoking, herbs, and alcohol. Avoid drugs not approved by your doctor.  Do not use any tobacco products, including cigarettes, chewing tobacco, and electronic cigarettes. If you need help quitting, ask your doctor. You may get counseling or other support to help you quit.  Only take medicine as told by your doctor. Some medicines are safe and some are not during pregnancy.  Exercise only as told by your doctor. Stop exercising if you start having cramps.  Eat regular, healthy meals.  Wear a good support bra if your breasts are tender.  Do not use hot tubs, steam rooms, or saunas.  Wear your seat belt when driving.  Avoid raw meat, uncooked cheese, and liter boxes and soil used by cats.  Take your prenatal vitamins.  Take 1500-2000 milligrams of calcium daily starting at the 20th week of pregnancy until you deliver your baby.  Try taking medicine that helps you poop (stool softener) as needed, and if your doctor approves. Eat more fiber by eating fresh fruit, vegetables, and whole grains. Drink enough fluids to keep your pee (urine) clear or pale yellow.  Take warm water baths (sitz baths) to soothe pain or discomfort caused by hemorrhoids. Use hemorrhoid cream if your doctor approves.  If you have puffy, bulging veins (varicose veins), wear support hose. Raise (elevate) your feet for 15 minutes, 3-4 times a day. Limit salt in your diet.  Avoid heavy  lifting, wear low heals, and sit up straight.  Rest with your legs raised if you have leg cramps or low back pain.  Visit your dentist if you have not gone during your pregnancy. Use a soft toothbrush to brush your teeth. Be gentle when you floss.  You can have sex (intercourse) unless your doctor tells you not to.  Go to your doctor visits. Get help if:  You feel dizzy.  You have mild cramps or pressure in your lower belly (abdomen).  You have a nagging pain in your belly area.  You continue to feel sick to your stomach (nauseous), throw up (vomit), or have watery poop (diarrhea).  You have bad smelling fluid coming from your vagina.  You have pain with peeing (urination). Get help right away if:  You have a fever.  You are leaking fluid from your vagina.  You have spotting or bleeding from your vagina.  You have severe belly cramping or pain.  You lose or gain weight rapidly.  You have trouble catching your breath and have chest pain.  You notice sudden or extreme puffiness (swelling) of your face, hands, ankles, feet, or legs.  You have not felt the baby move in over an hour.  You have severe headaches that do not go away with medicine.  You have vision changes. This information is not intended to replace advice given to you by your health care provider. Make sure you discuss any questions you have with your health care   provider. Document Released: 06/20/2009 Document Revised: 09/01/2015 Document Reviewed: 05/27/2012 Elsevier Interactive Patient Education  2017 Elsevier Inc.  

## 2016-03-06 NOTE — Progress Notes (Signed)
Patient states that she feels good, reports fetal movement.

## 2016-03-06 NOTE — Progress Notes (Signed)
   PRENATAL VISIT NOTE  Subjective:  Holly Greene is a 20 y.o. G1P0 at 3320w1d being seen today for ongoing prenatal care.  She is currently monitored for the following issues for this low-risk pregnancy and has Supervision of normal first pregnancy, antepartum on her problem list.  Patient reports no complaints.  Contractions: Not present. Vag. Bleeding: None.  Movement: Present. Denies leaking of fluid.   The following portions of the patient's history were reviewed and updated as appropriate: allergies, current medications, past family history, past medical history, past social history, past surgical history and problem list. Problem list updated.  Objective:   Vitals:   03/06/16 1056  BP: 126/76  Pulse: (!) 105  Temp: 97.4 F (36.3 C)  Weight: 194 lb 6.4 oz (88.2 kg)    Fetal Status: Fetal Heart Rate (bpm): 143   Movement: Present     General:  Alert, oriented and cooperative. Patient is in no acute distress.  Skin: Skin is warm and dry. No rash noted.   Cardiovascular: Normal heart rate noted  Respiratory: Normal respiratory effort, no problems with respiration noted  Abdomen: Soft, gravid, appropriate for gestational age. Pain/Pressure: Absent     Pelvic:  Cervical exam deferred        Extremities: Normal range of motion.  Edema: None  Mental Status: Normal mood and affect. Normal behavior. Normal judgment and thought content.   Assessment and Plan:  Pregnancy: G1P0 at 4920w1d  1. Supervision of normal first pregnancy, antepartum Normal first screen - AFP, Quad Screen  Preterm labor symptoms and general obstetric precautions including but not limited to vaginal bleeding, contractions, leaking of fluid and fetal movement were reviewed in detail with the patient. Please refer to After Visit Summary for other counseling recommendations.  Return in about 4 weeks (around 04/03/2016) for schedule 2 hr GTT asap lab visit.   Adam PhenixJames G Arnold, MD

## 2016-03-08 ENCOUNTER — Other Ambulatory Visit: Payer: Self-pay

## 2016-03-08 DIAGNOSIS — Z3490 Encounter for supervision of normal pregnancy, unspecified, unspecified trimester: Secondary | ICD-10-CM

## 2016-03-08 LAB — AFP, QUAD SCREEN
DIA MOM VALUE: 1.61
DIA Value (EIA): 242.05 pg/mL
DSR (BY AGE) 1 IN: 1143
DSR (Second Trimester) 1 IN: 469
Gestational Age: 18 WEEKS
MSAFP MOM: 0.89
MSAFP: 35.7 ng/mL
MSHCG MOM: 2.38
MSHCG: 55664 m[IU]/mL
Maternal Age At EDD: 21.3 YEARS
Osb Risk: 10000
T18 (By Age): 1:4452 {titer}
Test Results:: NEGATIVE
Weight: 194 [lb_av]
uE3 Mom: 0.7
uE3 Value: 0.87 ng/mL

## 2016-03-09 LAB — GLUCOSE TOLERANCE, 2 HOURS W/ 1HR
GLUCOSE, 1 HOUR: 88 mg/dL (ref 65–179)
Glucose, 2 hour: 78 mg/dL (ref 65–152)
Glucose, Fasting: 71 mg/dL (ref 65–91)

## 2016-03-15 ENCOUNTER — Ambulatory Visit (INDEPENDENT_AMBULATORY_CARE_PROVIDER_SITE_OTHER): Payer: Medicaid Other

## 2016-03-15 DIAGNOSIS — Z3402 Encounter for supervision of normal first pregnancy, second trimester: Secondary | ICD-10-CM

## 2016-03-15 DIAGNOSIS — Z3400 Encounter for supervision of normal first pregnancy, unspecified trimester: Secondary | ICD-10-CM

## 2016-03-26 ENCOUNTER — Encounter (HOSPITAL_COMMUNITY): Payer: Self-pay | Admitting: *Deleted

## 2016-03-26 ENCOUNTER — Inpatient Hospital Stay (HOSPITAL_COMMUNITY)
Admission: AD | Admit: 2016-03-26 | Discharge: 2016-03-26 | Disposition: A | Discharge status: Home / Self Care | Payer: Medicaid Other | Source: Ambulatory Visit | Attending: Family Medicine | Admitting: Family Medicine

## 2016-03-26 DIAGNOSIS — O4692 Antepartum hemorrhage, unspecified, second trimester: Secondary | ICD-10-CM | POA: Diagnosis present

## 2016-03-26 DIAGNOSIS — J45909 Unspecified asthma, uncomplicated: Secondary | ICD-10-CM | POA: Diagnosis not present

## 2016-03-26 DIAGNOSIS — O99512 Diseases of the respiratory system complicating pregnancy, second trimester: Secondary | ICD-10-CM | POA: Insufficient documentation

## 2016-03-26 DIAGNOSIS — N930 Postcoital and contact bleeding: Secondary | ICD-10-CM | POA: Insufficient documentation

## 2016-03-26 DIAGNOSIS — F319 Bipolar disorder, unspecified: Secondary | ICD-10-CM | POA: Insufficient documentation

## 2016-03-26 DIAGNOSIS — O99342 Other mental disorders complicating pregnancy, second trimester: Secondary | ICD-10-CM | POA: Diagnosis not present

## 2016-03-26 DIAGNOSIS — Z3A21 21 weeks gestation of pregnancy: Secondary | ICD-10-CM | POA: Insufficient documentation

## 2016-03-26 DIAGNOSIS — Z3400 Encounter for supervision of normal first pregnancy, unspecified trimester: Secondary | ICD-10-CM

## 2016-03-26 NOTE — MAU Provider Note (Signed)
History     CSN: [id number]  Arrival date and time: 03/26/16 2142   First Provider Initiated Contact with Patient 03/26/16 2241      Chief Complaint  Patient presents with  . Vaginal Bleeding   Patient is a 20 y/o G1P0 at 21w coming in with complaint of vaginal bleeding. Around 8:45pm, noticed pink tint on toilet paper when she wiped. Had intercourse around 6pm. No prior vaginal bleeding. No LOF, no ctx. No abdominal pain. No vaginal pain. No complications of pregnancy. No longer experiencing any bleeding.    Clinic  [facility] Prenatal Labs  Dating  LMP c/w sono Blood type: O/Positive/-- (10/05 1049)   Genetic Screen 1 Screen:    AFP:     Quad:     NIPS: Antibody:Negative (10/05 1049)neg  Anatomic US  Rubella: 4.06 (10/05 1049)Immune  GTT Third trimester:  RPR: Non Reactive (10/05 1049)   Flu vaccine  10/5 HBsAg: Negative (10/05 1049) Neg  TDaP vaccine                                               Rhogam: HIV: Non Reactive (10/05 1049)   Baby Food  breast                                             GBS: (For PCN allergy, check sensitivities)  Contraception  undecided Pap:n/a  Circumcision    Pediatrician undecided   Support Person  Mother, FOB     OB History    Gravida Para Term Preterm AB Living   1             SAB TAB Ectopic Multiple Live Births                  Past Medical History:  Diagnosis Date  . Asthma    as a child  . Bipolar 1 disorder (HCC)   . Defiant behavior   . Eczema     Past Surgical History:  Procedure Laterality Date  . TONSILLECTOMY      No family history on file.  Social History  Substance Use Topics  . Smoking status: Never Smoker  . Smokeless tobacco: Never Used  . Alcohol use No    Allergies: No Known Allergies  Prescriptions Prior to Admission  Medication Sig Dispense Refill Last Dose  . albuterol (PROVENTIL HFA;VENTOLIN HFA) 108 (90 Base) MCG/ACT inhaler Inhale 2 puffs into the lungs every 6 (six) hours as needed for  wheezing or shortness of breath. (Patient not taking: Reported on 03/06/2016) 1 Inhaler 2 Not Taking  . cephALEXin (KEFLEX) 500 MG capsule Take 1 capsule (500 mg total) by mouth 4 (four) times daily. (Patient not taking: Reported on 03/06/2016) 28 capsule 0 Not Taking  . ondansetron (ZOFRAN ODT) 4 MG disintegrating tablet Take 1 tablet (4 mg total) by mouth every 6 (six) hours as needed for nausea. (Patient not taking: Reported on 03/06/2016) 20 tablet 0 Not Taking  . Prenatal Vit-Fe Fumarate-FA (PRENATAL MULTIVITAMIN) TABS tablet Take 1 tablet by mouth daily at 12 noon.   Taking  . promethazine (PHENERGAN) 25 MG tablet Take 0.5-1 tablets (12.5-25 mg total) by mouth every 6 (six) hours as needed. (Patient not taking: Reported on 03/06/2016)  30 tablet 2 Not Taking    Review of Systems  Constitutional: Negative for chills, fever and malaise/fatigue.  Gastrointestinal: Negative for diarrhea, nausea and vomiting.  Genitourinary: Negative for dysuria, flank pain and hematuria.  Neurological: Negative for weakness.  Endo/Heme/Allergies: Does not bruise/bleed easily.   Physical Exam   Blood pressure 121/71, pulse 89, temperature 98.6 F (37 C), temperature source Oral, resp. rate 20, height 5\' 6"  (1.676 m), weight 203 lb 4 oz (92.2 kg), last menstrual period 10/31/2015.  Physical Exam  Constitutional: She is oriented to person, place, and time. She appears well-developed and well-nourished.  Respiratory: Effort normal.  Musculoskeletal: Normal range of motion.  Neurological: She is alert and oriented to person, place, and time.  FHR: 152bpm   MAU Course  Procedures  MDM  Assessment and Plan  Patient is a 20 y/o G1P0 at 21w coming in with complaint of spotting after intercourse. Patient reassured that this was likely due to irritation of the cervix, and is normal. FHR was reassuring. No complaints of discomfort at the time of interview. Patient discharged home with instructions for routine  outpatient follow-up.   Clearance Cootsndrew Tyson 03/26/2016, 10:43 PM   I have participated in the care of this patient and I agree with the above. Blood type O+. Cam HaiSHAW, KIMBERLY CNM 9:54 AM 03/27/2016

## 2016-03-26 NOTE — Discharge Instructions (Signed)
Second Trimester of Pregnancy The second trimester is from week 13 through week 28, month 4 through 6. This is often the time in pregnancy that you feel your best. Often times, morning sickness has lessened or quit. You may have more energy, and you may get hungry more often. Your unborn baby (fetus) is growing rapidly. At the end of the sixth month, he or she is about 9 inches long and weighs about 1 pounds. You will likely feel the baby move (quickening) between 18 and 20 weeks of pregnancy. Follow these instructions at home:  Avoid all smoking, herbs, and alcohol. Avoid drugs not approved by your doctor.  Do not use any tobacco products, including cigarettes, chewing tobacco, and electronic cigarettes. If you need help quitting, ask your doctor. You may get counseling or other support to help you quit.  Only take medicine as told by your doctor. Some medicines are safe and some are not during pregnancy.  Exercise only as told by your doctor. Stop exercising if you start having cramps.  Eat regular, healthy meals.  Wear a good support bra if your breasts are tender.  Do not use hot tubs, steam rooms, or saunas.  Wear your seat belt when driving.  Avoid raw meat, uncooked cheese, and liter boxes and soil used by cats.  Take your prenatal vitamins.  Take 1500-2000 milligrams of calcium daily starting at the 20th week of pregnancy until you deliver your baby.  Try taking medicine that helps you poop (stool softener) as needed, and if your doctor approves. Eat more fiber by eating fresh fruit, vegetables, and whole grains. Drink enough fluids to keep your pee (urine) clear or pale yellow.  Take warm water baths (sitz baths) to soothe pain or discomfort caused by hemorrhoids. Use hemorrhoid cream if your doctor approves.  If you have puffy, bulging veins (varicose veins), wear support hose. Raise (elevate) your feet for 15 minutes, 3-4 times a day. Limit salt in your diet.  Avoid heavy  lifting, wear low heals, and sit up straight.  Rest with your legs raised if you have leg cramps or low back pain.  Visit your dentist if you have not gone during your pregnancy. Use a soft toothbrush to brush your teeth. Be gentle when you floss.  You can have sex (intercourse) unless your doctor tells you not to.  Go to your doctor visits. Get help if:  You feel dizzy.  You have mild cramps or pressure in your lower belly (abdomen).  You have a nagging pain in your belly area.  You continue to feel sick to your stomach (nauseous), throw up (vomit), or have watery poop (diarrhea).  You have bad smelling fluid coming from your vagina.  You have pain with peeing (urination). Get help right away if:  You have a fever.  You are leaking fluid from your vagina.  You have spotting or bleeding from your vagina.  You have severe belly cramping or pain.  You lose or gain weight rapidly.  You have trouble catching your breath and have chest pain.  You notice sudden or extreme puffiness (swelling) of your face, hands, ankles, feet, or legs.  You have not felt the baby move in over an hour.  You have severe headaches that do not go away with medicine.  You have vision changes. This information is not intended to replace advice given to you by your health care provider. Make sure you discuss any questions you have with your health care   provider. Document Released: 06/20/2009 Document Revised: 09/01/2015 Document Reviewed: 05/27/2012 Elsevier Interactive Patient Education  2017 Elsevier Inc.  

## 2016-03-26 NOTE — MAU Note (Signed)
PT  SAYS  SHE WENT  TO B-ROOM  AT 825PM-  AND  WIPED -  SAW  LIGHT  PINK   .  NO CRAMPS.      PNC WITH  FAMINA -     ALL OK.    LAST SEX- TODAY AT  [facility]6PM

## 2016-04-03 ENCOUNTER — Ambulatory Visit (INDEPENDENT_AMBULATORY_CARE_PROVIDER_SITE_OTHER): Payer: Medicaid Other | Admitting: Obstetrics and Gynecology

## 2016-04-03 DIAGNOSIS — Z3402 Encounter for supervision of normal first pregnancy, second trimester: Secondary | ICD-10-CM

## 2016-04-03 DIAGNOSIS — Z3400 Encounter for supervision of normal first pregnancy, unspecified trimester: Secondary | ICD-10-CM

## 2016-04-03 NOTE — Progress Notes (Signed)
   PRENATAL VISIT NOTE  Subjective:  Holly Greene is a 20 y.o. G1P0 at 5839w1d being seen today for ongoing prenatal care.  She is currently monitored for the following issues for this low-risk pregnancy and has Supervision of normal first pregnancy, antepartum on her problem list.  Patient reports no complaints.  Contractions: Not present. Vag. Bleeding: None.  Movement: Present. Denies leaking of fluid.   The following portions of the patient's history were reviewed and updated as appropriate: allergies, current medications, past family history, past medical history, past social history, past surgical history and problem list. Problem list updated.  Objective:   Vitals:   04/03/16 1311  BP: 136/81  Pulse: 93  Weight: 205 lb (93 kg)    Fetal Status: Fetal Heart Rate (bpm): 152 Fundal Height: 22 cm Movement: Present     General:  Alert, oriented and cooperative. Patient is in no acute distress.  Skin: Skin is warm and dry. No rash noted.   Cardiovascular: Normal heart rate noted  Respiratory: Normal respiratory effort, no problems with respiration noted  Abdomen: Soft, gravid, appropriate for gestational age. Pain/Pressure: Absent     Pelvic:  Cervical exam deferred        Extremities: Normal range of motion.  Edema: None  Mental Status: Normal mood and affect. Normal behavior. Normal judgment and thought content.   Assessment and Plan:  Pregnancy: G1P0 at 339w1d  1. Supervision of normal first pregnancy, antepartum Patient is doing well Follow up anatomy ultrasound ordered 2hr glucola and labs next visit  Preterm labor symptoms and general obstetric precautions including but not limited to vaginal bleeding, contractions, leaking of fluid and fetal movement were reviewed in detail with the patient. Please refer to After Visit Summary for other counseling recommendations.  Return in about 4 weeks (around 05/01/2016) for ROB with 2 hr glucola.   Catalina AntiguaPeggy Samyiah Halvorsen, MD

## 2016-04-03 NOTE — Addendum Note (Signed)
Addended by: Catalina AntiguaONSTANT, Jadesola Poynter on: 04/03/2016 01:33 PM   Modules accepted: Orders

## 2016-04-09 NOTE — L&D Delivery Note (Signed)
Delivery Note At 11:49 PM a viable female was delivered via Vaginal, Spontaneous Delivery (Presentation: LOA).  APGAR: 5,8 ; weight pending  .   Placenta status: Spontaneous, intact .  Cord: 3 vessel.  Anesthesia:  epidural Episiotomy: None Lacerations: None Est. Blood Loss (mL): 200  Mom to postpartum.  Baby to Couplet care / Skin to Skin.  Cleone Slim 07/28/2016, 12:11 AM  Patient is a G1 at [redacted]w[redacted]d who was admitted for IOL due to gHTN, but otherwise uncomplicated prenatal course.  She progressed with augmentation via cytotec, FB, Pit, and AROM.  I was gloved and present for delivery in its entirety.  Second stage of labor progressed, baby delivered after pushing for a little over an hour.  Mild decels during second stage noted.  Complications: none  Lacerations: none  EBL: 200cc  Cam Hai, CNM 3:29 AM  07/28/2016

## 2016-04-11 ENCOUNTER — Ambulatory Visit (HOSPITAL_COMMUNITY)
Admission: RE | Admit: 2016-04-11 | Discharge: 2016-04-11 | Disposition: A | Discharge status: Home / Self Care | Payer: Medicaid Other | Source: Ambulatory Visit | Attending: Obstetrics and Gynecology | Admitting: Obstetrics and Gynecology

## 2016-04-11 ENCOUNTER — Other Ambulatory Visit: Payer: Self-pay | Admitting: Obstetrics and Gynecology

## 2016-04-11 DIAGNOSIS — Z3A23 23 weeks gestation of pregnancy: Secondary | ICD-10-CM

## 2016-04-11 DIAGNOSIS — Z363 Encounter for antenatal screening for malformations: Secondary | ICD-10-CM | POA: Diagnosis present

## 2016-04-11 DIAGNOSIS — Z3400 Encounter for supervision of normal first pregnancy, unspecified trimester: Secondary | ICD-10-CM

## 2016-05-01 ENCOUNTER — Ambulatory Visit (INDEPENDENT_AMBULATORY_CARE_PROVIDER_SITE_OTHER): Payer: Medicaid Other | Admitting: Obstetrics and Gynecology

## 2016-05-01 ENCOUNTER — Other Ambulatory Visit: Payer: Medicaid Other

## 2016-05-01 VITALS — BP 120/80 | HR 87 | Wt 208.0 lb

## 2016-05-01 DIAGNOSIS — Z3402 Encounter for supervision of normal first pregnancy, second trimester: Secondary | ICD-10-CM

## 2016-05-01 DIAGNOSIS — Z3400 Encounter for supervision of normal first pregnancy, unspecified trimester: Secondary | ICD-10-CM

## 2016-05-01 NOTE — Addendum Note (Signed)
Addended by: Dalphine HandingGARDNER, Clarke Peretz L on: 05/01/2016 09:58 AM   Modules accepted: Orders

## 2016-05-01 NOTE — Progress Notes (Signed)
2 gtt today. Wishes to defer Tdap until next visit.

## 2016-05-01 NOTE — Progress Notes (Signed)
Subjective:  Holly Greene is a 21 y.o. G1P0 at 5878w1d being seen today for ongoing prenatal care.  She is currently monitored for the following issues for this low-risk pregnancy and has Supervision of normal first pregnancy, antepartum on her problem list.  Patient reports no complaints.  Contractions: Not present. Vag. Bleeding: None.  Movement: Present. Denies leaking of fluid.   The following portions of the patient's history were reviewed and updated as appropriate: allergies, current medications, past family history, past medical history, past social history, past surgical history and problem list. Problem list updated.  Objective:   Vitals:   05/01/16 0833  BP: 120/80  Pulse: 87  Weight: 208 lb (94.3 kg)    Fetal Status: Fetal Heart Rate (bpm): 146 Fundal Height: 26 cm Movement: Present     General:  Alert, oriented and cooperative. Patient is in no acute distress.  Skin: Skin is warm and dry. No rash noted.   Cardiovascular: Normal heart rate noted  Respiratory: Normal respiratory effort, no problems with respiration noted  Abdomen: Soft, gravid, appropriate for gestational age. Pain/Pressure: Absent     Pelvic:  Cervical exam deferred        Extremities: Normal range of motion.  Edema: None  Mental Status: Normal mood and affect. Normal behavior. Normal judgment and thought content.   Urinalysis:      Assessment and Plan:  Pregnancy: G1P0 at 1778w1d Glucola today Tdap at next visit.  There are no diagnoses linked to this encounter. Preterm labor symptoms and general obstetric precautions including but not limited to vaginal bleeding, contractions, leaking of fluid and fetal movement were reviewed in detail with the patient. Please refer to After Visit Summary for other counseling recommendations.  Return in about 4 weeks (around 05/29/2016) for OB visit.   Hermina StaggersMichael L Norlan Rann, MD

## 2016-05-02 LAB — CBC
Hematocrit: 35 % (ref 34.0–46.6)
Hemoglobin: 11.5 g/dL (ref 11.1–15.9)
MCH: 31.8 pg (ref 26.6–33.0)
MCHC: 32.9 g/dL (ref 31.5–35.7)
MCV: 97 fL (ref 79–97)
PLATELETS: 229 10*3/uL (ref 150–379)
RBC: 3.62 x10E6/uL — ABNORMAL LOW (ref 3.77–5.28)
RDW: 13.7 % (ref 12.3–15.4)
WBC: 10.2 10*3/uL (ref 3.4–10.8)

## 2016-05-02 LAB — GLUCOSE TOLERANCE, 2 HOURS W/ 1HR
GLUCOSE, 1 HOUR: 113 mg/dL (ref 65–179)
GLUCOSE, FASTING: 74 mg/dL (ref 65–91)
Glucose, 2 hour: 90 mg/dL (ref 65–152)

## 2016-05-02 LAB — RPR: RPR: NONREACTIVE

## 2016-05-02 LAB — HIV ANTIBODY (ROUTINE TESTING W REFLEX): HIV SCREEN 4TH GENERATION: NONREACTIVE

## 2016-05-29 ENCOUNTER — Ambulatory Visit (INDEPENDENT_AMBULATORY_CARE_PROVIDER_SITE_OTHER): Payer: Medicaid Other | Admitting: Certified Nurse Midwife

## 2016-05-29 DIAGNOSIS — Z3403 Encounter for supervision of normal first pregnancy, third trimester: Secondary | ICD-10-CM

## 2016-05-29 DIAGNOSIS — Z3400 Encounter for supervision of normal first pregnancy, unspecified trimester: Secondary | ICD-10-CM

## 2016-05-29 NOTE — Progress Notes (Signed)
No concerns per pt 

## 2016-05-29 NOTE — Progress Notes (Signed)
   PRENATAL VISIT NOTE  Subjective:  Holly Greene is a 21 y.o. G1P0 at 8883w1d being seen today for ongoing prenatal care.  She is currently monitored for the following issues for this low-risk pregnancy and has Supervision of normal first pregnancy, antepartum on her problem list.  Patient reports no complaints.  Contractions: Not present. Vag. Bleeding: None.  Movement: Present. Denies leaking of fluid.   The following portions of the patient's history were reviewed and updated as appropriate: allergies, current medications, past family history, past medical history, past social history, past surgical history and problem list. Problem list updated.  Objective:   Vitals:   05/29/16 0919  BP: 131/84  Pulse: 76  Weight: 214 lb (97.1 kg)    Fetal Status: Fetal Heart Rate (bpm): 140 Fundal Height: 30 cm Movement: Present     General:  Alert, oriented and cooperative. Patient is in no acute distress.  Skin: Skin is warm and dry. No rash noted.   Cardiovascular: Normal heart rate noted  Respiratory: Normal respiratory effort, no problems with respiration noted  Abdomen: Soft, gravid, appropriate for gestational age. Pain/Pressure: Absent     Pelvic:  Cervical exam deferred        Extremities: Normal range of motion.  Edema: None  Mental Status: Normal mood and affect. Normal behavior. Normal judgment and thought content.   Assessment and Plan:  Pregnancy: G1P0 at 6283w1d  1. Supervision of normal first pregnancy, antepartum     Doing well. Contraception options discussed.  Peds list given.    Preterm labor symptoms and general obstetric precautions including but not limited to vaginal bleeding, contractions, leaking of fluid and fetal movement were reviewed in detail with the patient. Please refer to After Visit Summary for other counseling recommendations.  Return in about 2 weeks (around 06/12/2016) for ROB.   Roe Coombsachelle A Darryll Raju, CNM

## 2016-05-29 NOTE — Patient Instructions (Signed)
AREA PEDIATRIC/FAMILY PRACTICE PHYSICIANS  [facility] [address], [address] [place], [place]  [address] Phone - [phone]   Fax - [phone]  [facility] [address] [place], [place] [address] Phone - [phone]   Fax - [phone]  [facility] [address] [place], [place]  [address] Phone - [phone]   Fax - [phone]  [facility] [address] [place], [place]  [address] Phone - [phone]   Fax - [phone]  [facility] [address] [place], [place]  [address] Phone - [phone]   Fax - [phone]  [facility] [address], [address] [place], [place]  [address] Phone - [phone]   Fax - [phone]  [facility] [address] [place], [place]  [address] Phone - [phone]   Fax - [phone]  [facility] [address], [address] [place], [place]  [address] Phone - [phone]   Fax - [phone]  [facility] [address] [place], [place]  [address] Phone - [phone]   Fax - [phone] [facility] [address] [place], [place]  [address] Phone - [phone]   Fax - [phone]  [facility] [address] [place], [place]  [address] Phone - [phone]   Fax - [phone]  [facility] [address] [place], [place]  [address] Phone - [phone]   Fax - [phone]  [facility] [address], [address] [place], [place]  [address] Phone - [phone]   Fax - [phone]  [facility] [address] [place], [place]  [address] Phone - [phone]  [facility] [address] [place], [place]  [address] Phone - [phone]   Fax - [phone]  [facility] [address] [place], [place]  [address] Phone - [phone]   Fax - [phone]  [facility] [address] [place], [place]  [address] Phone - [phone]   Fax - [phone]  [facility] [address] [place], [place]  [address] Phone - [phone]   Fax - [phone]   [facility] [address] [place], [place]  [address] Phone - [phone]   Fax - [phone]  [facility] [address] [place], [place]  [address] Phone - [phone]   Fax - [phone]  [facility] [address] [place], [place]  [address] Phone - [phone]   Fax - [phone]  [facility] [address] , [place]  [address] Phone - [phone]   Fax - [phone]  [facility] [address] , [place]  [address] Phone - [phone]   Fax - [phone] [facility] [address] [place], [place]  [address] Phone - [phone]   Fax - [phone]  [facility] [address] [place], [place]  [address] Phone - [phone]   Fax - [phone]  [facility] [address] [place],   [address] Phone - [phone]   Fax - [phone]   

## 2016-06-09 ENCOUNTER — Inpatient Hospital Stay (HOSPITAL_COMMUNITY)
Admission: AD | Admit: 2016-06-09 | Discharge: 2016-06-09 | Disposition: A | Discharge status: Home / Self Care | Payer: Medicaid Other | Source: Ambulatory Visit | Attending: Obstetrics & Gynecology | Admitting: Obstetrics & Gynecology

## 2016-06-09 ENCOUNTER — Encounter (HOSPITAL_COMMUNITY): Payer: Self-pay | Admitting: Obstetrics and Gynecology

## 2016-06-09 DIAGNOSIS — O99343 Other mental disorders complicating pregnancy, third trimester: Secondary | ICD-10-CM | POA: Insufficient documentation

## 2016-06-09 DIAGNOSIS — Z3A31 31 weeks gestation of pregnancy: Secondary | ICD-10-CM

## 2016-06-09 DIAGNOSIS — O23593 Infection of other part of genital tract in pregnancy, third trimester: Secondary | ICD-10-CM | POA: Diagnosis not present

## 2016-06-09 DIAGNOSIS — R109 Unspecified abdominal pain: Secondary | ICD-10-CM | POA: Diagnosis not present

## 2016-06-09 DIAGNOSIS — O9989 Other specified diseases and conditions complicating pregnancy, childbirth and the puerperium: Secondary | ICD-10-CM | POA: Diagnosis not present

## 2016-06-09 DIAGNOSIS — B9689 Other specified bacterial agents as the cause of diseases classified elsewhere: Secondary | ICD-10-CM | POA: Diagnosis not present

## 2016-06-09 DIAGNOSIS — O26899 Other specified pregnancy related conditions, unspecified trimester: Secondary | ICD-10-CM | POA: Diagnosis not present

## 2016-06-09 DIAGNOSIS — L309 Dermatitis, unspecified: Secondary | ICD-10-CM | POA: Diagnosis not present

## 2016-06-09 DIAGNOSIS — Z202 Contact with and (suspected) exposure to infections with a predominantly sexual mode of transmission: Secondary | ICD-10-CM | POA: Insufficient documentation

## 2016-06-09 DIAGNOSIS — N760 Acute vaginitis: Secondary | ICD-10-CM

## 2016-06-09 DIAGNOSIS — J45909 Unspecified asthma, uncomplicated: Secondary | ICD-10-CM | POA: Insufficient documentation

## 2016-06-09 DIAGNOSIS — O26893 Other specified pregnancy related conditions, third trimester: Secondary | ICD-10-CM | POA: Insufficient documentation

## 2016-06-09 DIAGNOSIS — Z3400 Encounter for supervision of normal first pregnancy, unspecified trimester: Secondary | ICD-10-CM

## 2016-06-09 DIAGNOSIS — O2340 Unspecified infection of urinary tract in pregnancy, unspecified trimester: Secondary | ICD-10-CM | POA: Diagnosis present

## 2016-06-09 DIAGNOSIS — O2343 Unspecified infection of urinary tract in pregnancy, third trimester: Secondary | ICD-10-CM

## 2016-06-09 DIAGNOSIS — Z3689 Encounter for other specified antenatal screening: Secondary | ICD-10-CM | POA: Diagnosis not present

## 2016-06-09 DIAGNOSIS — F319 Bipolar disorder, unspecified: Secondary | ICD-10-CM | POA: Diagnosis not present

## 2016-06-09 DIAGNOSIS — R102 Pelvic and perineal pain: Secondary | ICD-10-CM | POA: Diagnosis present

## 2016-06-09 DIAGNOSIS — O99513 Diseases of the respiratory system complicating pregnancy, third trimester: Secondary | ICD-10-CM | POA: Insufficient documentation

## 2016-06-09 LAB — WET PREP, GENITAL
Sperm: NONE SEEN
TRICH WET PREP: NONE SEEN

## 2016-06-09 LAB — URINALYSIS, ROUTINE W REFLEX MICROSCOPIC
Bilirubin Urine: NEGATIVE
Glucose, UA: NEGATIVE mg/dL
Hgb urine dipstick: NEGATIVE
KETONES UR: NEGATIVE mg/dL
Nitrite: POSITIVE — AB
PH: 6 (ref 5.0–8.0)
PROTEIN: 30 mg/dL — AB
Specific Gravity, Urine: 1.025 (ref 1.005–1.030)

## 2016-06-09 MED ORDER — METRONIDAZOLE 500 MG PO TABS: 500.0000 mg | ORAL_TABLET | Freq: Two times a day (BID) | ORAL | 0 refills | Status: DC

## 2016-06-09 MED ORDER — CEPHALEXIN 500 MG PO CAPS: 500.0000 mg | ORAL_CAPSULE | Freq: Four times a day (QID) | ORAL | 0 refills | Status: DC

## 2016-06-09 NOTE — MAU Provider Note (Signed)
History     CSN: [id number]  Arrival date and time: 06/09/16 1537   First Provider Initiated Contact with Patient 06/09/16 1619      Chief Complaint  Patient presents with  . Abdominal Pain  . Exposure to STD   G1 @31 .5 weeks here with LAP. She reports pain started 2 days ago. Feels like needles poking. Pain was initially intermittent but is now constant. Rates pain 7/10. Denies VB, LOF, or ctx. Reports good FM. Requesting check for UTI and STIs. Denies any new partners or known exposures. Denies vaginal discharge.    OB History    Gravida Para Term Preterm AB Living   1             SAB TAB Ectopic Multiple Live Births                  Past Medical History:  Diagnosis Date  . Asthma    as a child  . Bipolar 1 disorder (HCC)   . Defiant behavior   . Eczema     Past Surgical History:  Procedure Laterality Date  . TONSILLECTOMY      No family history on file.  Social History  Substance Use Topics  . Smoking status: Never Smoker  . Smokeless tobacco: Never Used  . Alcohol use No    Allergies: No Known Allergies  Prescriptions Prior to Admission  Medication Sig Dispense Refill Last Dose  . albuterol (PROVENTIL HFA;VENTOLIN HFA) 108 (90 Base) MCG/ACT inhaler Inhale 2 puffs into the lungs every 6 (six) hours as needed for wheezing or shortness of breath. 1 Inhaler 2 unknown  . albuterol (PROVENTIL) (2.5 MG/3ML) 0.083% nebulizer solution Take 2.5 mg by nebulization every 6 (six) hours as needed for wheezing or shortness of breath.   unknown  . Prenatal Vit-Fe Fumarate-FA (PRENATAL MULTIVITAMIN) TABS tablet Take 1 tablet by mouth daily.    06/09/2016 at Unknown time    Review of Systems  Constitutional: Negative for fever.  Gastrointestinal: Positive for abdominal pain. Negative for constipation, diarrhea, nausea and vomiting.  Genitourinary: Negative for dysuria, frequency, hematuria, urgency, vaginal bleeding and vaginal discharge.  Musculoskeletal: Negative for  back pain.   Physical Exam   Blood pressure 136/86, pulse 90, resp. rate 18, height 5\' 5"  (1.651 m), weight 98.4 kg (217 lb), last menstrual period 10/31/2015.  Physical Exam  Constitutional: She is oriented to person, place, and time. She appears well-developed and well-nourished. No distress (appears comfortable).  HENT:  Head: Normocephalic and atraumatic.  Neck: Normal range of motion.  Cardiovascular: Normal rate.   Respiratory: Effort normal.  GI: Soft. She exhibits no distension. There is no tenderness.  gravid  Genitourinary:  Genitourinary Comments: External: no lesions or erythema Vagina: rugated, parous/ nulli, copious malodorous grey discharge SVE: closed/long   Musculoskeletal: Normal range of motion.  Neurological: She is alert and oriented to person, place, and time.  Skin: Skin is warm and dry.  Psychiatric: She has a normal mood and affect.   EFM: 145 bpm, mod variability, + accels, no decels Toco: none  Results for orders placed or performed during the hospital encounter of 06/09/16 (from the past 24 hour(s))  Urinalysis, Routine w reflex microscopic     Status: Abnormal   Collection Time: 06/09/16  3:43 PM  Result Value Ref Range   Color, Urine YELLOW YELLOW   APPearance CLOUDY (A) CLEAR   Specific Gravity, Urine 1.025 1.005 - 1.030   pH 6.0 5.0 - 8.0  Glucose, UA NEGATIVE NEGATIVE mg/dL   Hgb urine dipstick NEGATIVE NEGATIVE   Bilirubin Urine NEGATIVE NEGATIVE   Ketones, ur NEGATIVE NEGATIVE mg/dL   Protein, ur 30 (A) NEGATIVE mg/dL   Nitrite POSITIVE (A) NEGATIVE   Leukocytes, UA MODERATE (A) NEGATIVE   RBC / HPF 0-5 0 - 5 RBC/hpf   WBC, UA 6-30 0 - 5 WBC/hpf   Bacteria, UA RARE (A) NONE SEEN   Squamous Epithelial / LPF 0-5 (A) NONE SEEN   Mucous PRESENT   Wet prep, genital     Status: Abnormal   Collection Time: 06/09/16  4:35 PM  Result Value Ref Range   Yeast Wet Prep HPF POC PRESENT (A) NONE SEEN   Trich, Wet Prep NONE SEEN NONE SEEN    Clue Cells Wet Prep HPF POC PRESENT (A) NONE SEEN   WBC, Wet Prep HPF POC MODERATE (A) NONE SEEN   Sperm NONE SEEN    MAU Course  Procedures  MDM Labs ordered and reviewed. No evidence of PTL. Will treat UTI and BV. UC ordered. Stable for discharge home.   Assessment and Plan   1. [redacted] weeks gestation of pregnancy   2. Supervision of normal first pregnancy, antepartum   3. NST (non-stress test) reactive   4. Urinary tract infection in mother during third trimester of pregnancy   5. Bacterial vaginosis   6. Abdominal pain affecting pregnancy    Discharge home Follow up in office next week as scheduled Increase water intake Rx Keflex Rx Flagyl PTL precautions  Allergies as of 06/09/2016   No Known Allergies     Medication List    TAKE these medications   albuterol (2.5 MG/3ML) 0.083% nebulizer solution Commonly known as:  PROVENTIL Take 2.5 mg by nebulization every 6 (six) hours as needed for wheezing or shortness of breath.   albuterol 108 (90 Base) MCG/ACT inhaler Commonly known as:  PROVENTIL HFA;VENTOLIN HFA Inhale 2 puffs into the lungs every 6 (six) hours as needed for wheezing or shortness of breath.   cephALEXin 500 MG capsule Commonly known as:  KEFLEX Take 1 capsule (500 mg total) by mouth 4 (four) times daily.   metroNIDAZOLE 500 MG tablet Commonly known as:  FLAGYL Take 1 tablet (500 mg total) by mouth 2 (two) times daily.   prenatal multivitamin Tabs tablet Take 1 tablet by mouth daily.      Donette Larry, CNM 06/09/2016, 4:22 PM

## 2016-06-09 NOTE — MAU Note (Signed)
Pt complains sharp abd pain for two days. Pt request to be tested for STD

## 2016-06-09 NOTE — Discharge Instructions (Signed)
Bacterial Vaginosis °Bacterial vaginosis is an infection of the vagina. It happens when too many germs (bacteria) grow in the vagina. This infection puts you at risk for infections from sex (STIs). Treating this infection can lower your risk for some STIs. You should also treat this if you are pregnant. It can cause your baby to be born early. °Follow these instructions at home: °Medicines °· Take over-the-counter and prescription medicines only as told by your doctor. °· Take or use your antibiotic medicine as told by your doctor. Do not stop taking or using it even if you start to feel better. °General instructions °· If you your sexual partner is a woman, tell her that you have this infection. She needs to get treatment if she has symptoms. If you have a female partner, he does not need to be treated. °· During treatment: °? Avoid sex. °? Do not douche. °? Avoid alcohol as told. °? Avoid breastfeeding as told. °· Drink enough fluid to keep your pee (urine) clear or pale yellow. °· Keep your vagina and butt (rectum) clean. °? Wash the area with warm water every day. °? Wipe from front to back after you use the toilet. °· Keep all follow-up visits as told by your doctor. This is important. °Preventing this condition °· Do not douche. °· Use only warm water to wash around your vagina. °· Use protection when you have sex. This includes: °? Latex condoms. °? Dental dams. °· Limit how many people you have sex with. It is best to only have sex with the same person (be monogamous). °· Get tested for STIs. Have your partner get tested. °· Wear underwear that is cotton or lined with cotton. °· Avoid tight pants and pantyhose. This is most important in summer. °· Do not use any products that have nicotine or tobacco in them. These include cigarettes and e-cigarettes. If you need help quitting, ask your doctor. °· Do not use illegal drugs. °· Limit how much alcohol you drink. °Contact a doctor if: °· Your symptoms do not get  better, even after you are treated. °· You have more discharge or pain when you pee (urinate). °· You have a fever. °· You have pain in your belly (abdomen). °· You have pain with sex. °· Your bleed from your vagina between periods. °Summary °· This infection happens when too many germs (bacteria) grow in the vagina. °· Treating this condition can lower your risk for some infections from sex (STIs). °· You should also treat this if you are pregnant. It can cause early (premature) birth. °· Do not stop taking or using your antibiotic medicine even if you start to feel better. °This information is not intended to replace advice given to you by your health care provider. Make sure you discuss any questions you have with your health care provider. °Document Released: 01/03/2008 Document Revised: 12/10/2015 Document Reviewed: 12/10/2015 °Elsevier Interactive Patient Education © 2017 [employer] °Pregnancy and Urinary Tract Infection °What is a urinary tract infection? °A urinary tract infection (UTI) is an infection of any part of the urinary tract. This includes the kidneys, the tubes that connect your kidneys to your bladder (ureters), the bladder, and the tube that carries urine out of your body (urethra). These organs make, store, and get rid of urine in the body. A UTI can be a bladder infection (cystitis) or a kidney infection (pyelonephritis). This infection may be caused by fungi, viruses, and bacteria. Bacteria are the most common cause of   UTIs. °You are more likely to develop a UTI during pregnancy because: °· The physical and hormonal changes your body goes through can make it easier for bacteria to get into your urinary tract. °· Your growing baby puts pressure on your uterus and can affect urine flow. ° °Does a UTI place my baby at risk? °An untreated UTI during pregnancy could lead to a kidney infection, which can cause health problems that could affect your baby. Possible complications of an untreated  UTI include: °· Having your baby before 37 weeks of pregnancy (premature). °· Having a baby with a low birth weight. °· Developing high blood pressure during pregnancy (preeclampsia). ° °What are the symptoms of a UTI? °Symptoms of a UTI include: °· Fever. °· Frequent urination or passing small amounts of urine frequently. °· Needing to urinate urgently. °· Pain or a burning sensation with urination. °· Urine that smells bad or unusual. °· Cloudy urine. °· Pain in the lower abdomen or back. °· Trouble urinating. °· Blood in the urine. °· Vomiting or being less hungry than normal. °· Diarrhea or abdominal pain. °· Vaginal discharge. ° °What are the treatment options for a UTI during pregnancy? °Treatment for this condition may include: °· Antibiotic medicines that are safe to take during pregnancy. °· Other medicines to treat less common causes of UTI. ° °How can I prevent a UTI? ° °To prevent a UTI: °· Go to the bathroom as soon as you feel the need. °· Always wipe from front to back. °· Wash your genital area with soap and warm water daily. °· Empty your bladder before and after sex. °· Wear cotton underwear. °· Limit your intake of high sugar foods or drinks, such as regular soda, juice, and sweets. °· Drink 6-8 glasses of water daily. °· Do not wear tight-fitting pants. °· Do not douche or use deodorant sprays. °· Do not drink alcohol, caffeine, or carbonated drinks. These can irritate the bladder. ° °Contact a health care provider if: °· Your symptoms do not improve or get worse. °· You have a fever after two days of treatment. °· You have a rash. °· You have abnormal vaginal discharge. °· You have back or side pain. °· You have chills. °· You have nausea and vomiting. °Get help right away if: °Seek immediate medical care if you are pregnant and: °· You feel contractions in your uterus. °· You have lower belly pain. °· You have a gush of fluid from your vagina. °· You have blood in your urine. °· You are  vomiting and cannot keep down any medicines or water. ° °This information is not intended to replace advice given to you by your health care provider. Make sure you discuss any questions you have with your health care provider. °Document Released: 07/21/2010 Document Revised: 03/09/2016 Document Reviewed: 02/14/2015 °Elsevier Interactive Patient Education © 2017 [employer] ° °

## 2016-06-10 LAB — CULTURE, OB URINE

## 2016-06-11 ENCOUNTER — Telehealth (HOSPITAL_COMMUNITY): Payer: Self-pay

## 2016-06-11 LAB — GC/CHLAMYDIA PROBE AMP (~~LOC~~) NOT AT ARMC
Chlamydia: NEGATIVE
Neisseria Gonorrhea: POSITIVE — AB

## 2016-06-11 LAB — OB RESULTS CONSOLE GC/CHLAMYDIA: Gonorrhea: POSITIVE

## 2016-06-11 NOTE — Telephone Encounter (Signed)
Called patient. Verified DOB with patient. Pt aware her lab results during her last visit to [facility] showed she was positive for Gonorrhea. Pt aware to contact her the local Health [facility] in the county she resides to be seen for treatment of her positive results. Patient aware once treatment is received, abstain from intercourse x 2 weeks. Also, she is aware to inform her partner because they will also need to be treated. Communicable Disease Report faxed to health department. 

## 2016-06-12 ENCOUNTER — Encounter: Payer: Self-pay | Admitting: Certified Nurse Midwife

## 2016-06-14 ENCOUNTER — Inpatient Hospital Stay (HOSPITAL_COMMUNITY)
Admission: AD | Admit: 2016-06-14 | Discharge: 2016-06-14 | Disposition: A | Discharge status: Home / Self Care | Payer: Medicaid Other | Source: Ambulatory Visit | Attending: Obstetrics and Gynecology | Admitting: Obstetrics and Gynecology

## 2016-06-14 ENCOUNTER — Encounter (HOSPITAL_COMMUNITY): Payer: Self-pay

## 2016-06-14 DIAGNOSIS — O219 Vomiting of pregnancy, unspecified: Secondary | ICD-10-CM

## 2016-06-14 DIAGNOSIS — R030 Elevated blood-pressure reading, without diagnosis of hypertension: Secondary | ICD-10-CM

## 2016-06-14 DIAGNOSIS — O212 Late vomiting of pregnancy: Secondary | ICD-10-CM | POA: Diagnosis not present

## 2016-06-14 DIAGNOSIS — O26893 Other specified pregnancy related conditions, third trimester: Secondary | ICD-10-CM | POA: Diagnosis not present

## 2016-06-14 DIAGNOSIS — O98213 Gonorrhea complicating pregnancy, third trimester: Secondary | ICD-10-CM | POA: Diagnosis not present

## 2016-06-14 DIAGNOSIS — Z3A32 32 weeks gestation of pregnancy: Secondary | ICD-10-CM | POA: Insufficient documentation

## 2016-06-14 DIAGNOSIS — R110 Nausea: Secondary | ICD-10-CM | POA: Diagnosis present

## 2016-06-14 LAB — CBC
HCT: 35.2 % — ABNORMAL LOW (ref 36.0–46.0)
Hemoglobin: 12.1 g/dL (ref 12.0–15.0)
MCH: 32.4 pg (ref 26.0–34.0)
MCHC: 34.4 g/dL (ref 30.0–36.0)
MCV: 94.1 fL (ref 78.0–100.0)
PLATELETS: 232 10*3/uL (ref 150–400)
RBC: 3.74 MIL/uL — ABNORMAL LOW (ref 3.87–5.11)
RDW: 12.6 % (ref 11.5–15.5)
WBC: 11.5 10*3/uL — AB (ref 4.0–10.5)

## 2016-06-14 LAB — COMPREHENSIVE METABOLIC PANEL
ALBUMIN: 3 g/dL — AB (ref 3.5–5.0)
ALT: 19 U/L (ref 14–54)
AST: 49 U/L — AB (ref 15–41)
Alkaline Phosphatase: 77 U/L (ref 38–126)
Anion gap: 9 (ref 5–15)
BUN: 6 mg/dL (ref 6–20)
CHLORIDE: 102 mmol/L (ref 101–111)
CO2: 24 mmol/L (ref 22–32)
CREATININE: 0.59 mg/dL (ref 0.44–1.00)
Calcium: 9 mg/dL (ref 8.9–10.3)
GFR calc Af Amer: >60 mL/min (ref >60)
GLUCOSE: 90 mg/dL (ref 65–99)
POTASSIUM: 3.9 mmol/L (ref 3.5–5.1)
SODIUM: 135 mmol/L (ref 135–145)
Total Bilirubin: 0.4 mg/dL (ref 0.3–1.2)
Total Protein: 6.6 g/dL (ref 6.5–8.1)

## 2016-06-14 LAB — URINALYSIS, ROUTINE W REFLEX MICROSCOPIC
BACTERIA UA: NONE SEEN
BILIRUBIN URINE: NEGATIVE
Glucose, UA: NEGATIVE mg/dL
HGB URINE DIPSTICK: NEGATIVE
Ketones, ur: NEGATIVE mg/dL
Nitrite: NEGATIVE
PROTEIN: NEGATIVE mg/dL
SPECIFIC GRAVITY, URINE: 1.015 (ref 1.005–1.030)
pH: 8 (ref 5.0–8.0)

## 2016-06-14 LAB — PROTEIN / CREATININE RATIO, URINE
CREATININE, URINE: 119 mg/dL
Protein Creatinine Ratio: 0.08 mg/mg{Cre} (ref 0.00–0.15)
Total Protein, Urine: 10 mg/dL

## 2016-06-14 MED ORDER — AZITHROMYCIN 250 MG PO TABS
1000.0000 mg | ORAL_TABLET | Freq: Once | ORAL | Status: AC
  Administered 2016-06-14: 1000 mg via ORAL
  Filled 2016-06-14: qty 4

## 2016-06-14 MED ORDER — ONDANSETRON 8 MG PO TBDP
8.0000 mg | ORAL_TABLET | Freq: Once | ORAL | Status: AC
  Administered 2016-06-14: 8 mg via ORAL
  Filled 2016-06-14: qty 1

## 2016-06-14 MED ORDER — PROMETHAZINE HCL 25 MG PO TABS: 25.0000 mg | ORAL_TABLET | Freq: Four times a day (QID) | ORAL | 0 refills | Status: DC | PRN

## 2016-06-14 MED ORDER — CEFTRIAXONE SODIUM 250 MG IJ SOLR
250.0000 mg | Freq: Once | INTRAMUSCULAR | Status: AC
  Administered 2016-06-14: 250 mg via INTRAMUSCULAR
  Filled 2016-06-14: qty 250

## 2016-06-14 NOTE — MAU Note (Signed)
Pt reports she has felt nauseated and dizzy since she awakened at 0900. States she has antibiotics for a UTI and she didn't take it today because she thought that may be what was making her nauseated.

## 2016-06-14 NOTE — MAU Provider Note (Signed)
History     CSN: [id number]  Arrival date and time: 06/14/16 1504    First Provider Initiated Contact with Patient 06/14/16 1658        Chief Complaint  Patient presents with  . Nausea  . SEXUALLY TRANSMITTED DISEASE   HPI Holly Greene is a 21 y.o. G1P0 at 3961w3d who presents with nausea & requesting gonorrhea treatment. Patient was seen in MAU on 3/5 for vaginal pain. Gonorrhea came back positive & [facility] but states they can't see her until next Tuesday.  At time of previous MAU visit she was diagnosed with BV & started on flagyl. Reports that flagyl is making her nauseated. Denies vomiting today.  Denies abdominal pain, vaginal bleeding, or LOF. Positive fetal movement.   OB History    Gravida Para Term Preterm AB Living   1             SAB TAB Ectopic Multiple Live Births                  Past Medical History:  Diagnosis Date  . Asthma    as a child  . Bipolar 1 disorder (HCC)   . Defiant behavior   . Eczema     Past Surgical History:  Procedure Laterality Date  . TONSILLECTOMY      No family history on file.  Social History  Substance Use Topics  . Smoking status: Never Smoker  . Smokeless tobacco: Never Used  . Alcohol use No    Allergies: No Known Allergies  Prescriptions Prior to Admission  Medication Sig Dispense Refill Last Dose  . albuterol (PROVENTIL HFA;VENTOLIN HFA) 108 (90 Base) MCG/ACT inhaler Inhale 2 puffs into the lungs every 6 (six) hours as needed for wheezing or shortness of breath. 1 Inhaler 2 unknown  . albuterol (PROVENTIL) (2.5 MG/3ML) 0.083% nebulizer solution Take 2.5 mg by nebulization every 6 (six) hours as needed for wheezing or shortness of breath.   unknown  . cephALEXin (KEFLEX) 500 MG capsule Take 1 capsule (500 mg total) by mouth 4 (four) times daily. 28 capsule 0   . metroNIDAZOLE (FLAGYL) 500 MG tablet Take 1 tablet (500 mg total) by mouth 2 (two) times daily. 14 tablet 0   . Prenatal Vit-Fe  Fumarate-FA (PRENATAL MULTIVITAMIN) TABS tablet Take 1 tablet by mouth daily.    06/09/2016 at Unknown time    Review of Systems  Constitutional: Negative.   Eyes: Negative for visual disturbance.  Gastrointestinal: Positive for nausea. Negative for abdominal pain, constipation, diarrhea and vomiting.  Genitourinary: Negative for dysuria, frequency, hematuria, vaginal bleeding and vaginal discharge.  Neurological: Negative for headaches.   Physical Exam   Blood pressure 128/76, pulse 88, temperature 98.2 F (36.8 C), temperature source Oral, resp. rate 18, height 5\' 6"  (1.676 m), weight 214 lb (97.1 kg), last menstrual period 10/31/2015, SpO2 100 %.  Temp:  [98.1 F (36.7 C)-98.4 F (36.9 C)] 98.2 F (36.8 C) (03/08 1907) Pulse Rate:  [77-88] 88 (03/08 1907) Resp:  [18] 18 (03/08 1907) BP: (112-142)/(57-84) 128/76 (03/08 1907) SpO2:  [100 %] 100 % (03/08 1907) Weight:  [214 lb (97.1 kg)] 214 lb (97.1 kg) (03/08 1519)  Physical Exam  Nursing note and vitals reviewed. Constitutional: She is oriented to person, place, and time. She appears well-developed and well-nourished. No distress.  HENT:  Head: Normocephalic and atraumatic.  Eyes: Conjunctivae are normal. Right eye exhibits no discharge. Left eye exhibits no discharge. No  scleral icterus.  Neck: Normal range of motion.  Cardiovascular: Normal rate, regular rhythm and normal heart sounds.   No murmur heard. Respiratory: Effort normal and breath sounds normal. No respiratory distress. She has no wheezes.  GI: Soft.  Neurological: She is alert and oriented to person, place, and time. She has normal reflexes.  No clonus  Skin: Skin is warm and dry. She is not diaphoretic.  Psychiatric: She has a normal mood and affect. Her behavior is normal. Judgment and thought content normal.   Fetal Tracing:  Baseline: 145 Variability: moderate Accelerations: 15x15 Decelerations: none  Toco: none MAU Course  Procedures Results for  orders placed or performed during the hospital encounter of 06/14/16 (from the past 24 hour(s))  Urinalysis, Routine w reflex microscopic     Status: Abnormal   Collection Time: 06/14/16  5:28 PM  Result Value Ref Range   Color, Urine YELLOW YELLOW   APPearance CLEAR CLEAR   Specific Gravity, Urine 1.015 1.005 - 1.030   pH 8.0 5.0 - 8.0   Glucose, UA NEGATIVE NEGATIVE mg/dL   Hgb urine dipstick NEGATIVE NEGATIVE   Bilirubin Urine NEGATIVE NEGATIVE   Ketones, ur NEGATIVE NEGATIVE mg/dL   Protein, ur NEGATIVE NEGATIVE mg/dL   Nitrite NEGATIVE NEGATIVE   Leukocytes, UA TRACE (A) NEGATIVE   RBC / HPF 0-5 0 - 5 RBC/hpf   WBC, UA 0-5 0 - 5 WBC/hpf   Bacteria, UA NONE SEEN NONE SEEN   Squamous Epithelial / LPF 0-5 (A) NONE SEEN   Mucous PRESENT   CBC     Status: Abnormal   Collection Time: 06/14/16  5:28 PM  Result Value Ref Range   WBC 11.5 (H) 4.0 - 10.5 K/uL   RBC 3.74 (L) 3.87 - 5.11 MIL/uL   Hemoglobin 12.1 12.0 - 15.0 g/dL   HCT 16.1 (L) 09.6 - 04.5 %   MCV 94.1 78.0 - 100.0 fL   MCH 32.4 26.0 - 34.0 pg   MCHC 34.4 30.0 - 36.0 g/dL   RDW 40.9 81.1 - 91.4 %   Platelets 232 150 - 400 K/uL  Comprehensive metabolic panel     Status: Abnormal   Collection Time: 06/14/16  5:28 PM  Result Value Ref Range   Sodium 135 135 - 145 mmol/L   Potassium 3.9 3.5 - 5.1 mmol/L   Chloride 102 101 - 111 mmol/L   CO2 24 22 - 32 mmol/L   Glucose, Bld 90 65 - 99 mg/dL   BUN 6 6 - 20 mg/dL   Creatinine, Ser 7.82 0.44 - 1.00 mg/dL   Calcium 9.0 8.9 - 95.6 mg/dL   Total Protein 6.6 6.5 - 8.1 g/dL   Albumin 3.0 (L) 3.5 - 5.0 g/dL   AST 49 (H) 15 - 41 U/L   ALT 19 14 - 54 U/L   Alkaline Phosphatase 77 38 - 126 U/L   Total Bilirubin 0.4 0.3 - 1.2 mg/dL   GFR calc non Af Amer >60 >60 mL/min   GFR calc Af Amer >60 >60 mL/min   Anion gap 9 5 - 15  Protein / creatinine ratio, urine     Status: None   Collection Time: 06/14/16  5:28 PM  Result Value Ref Range   Creatinine, Urine 119.00 mg/dL    Total Protein, Urine 10 mg/dL   Protein Creatinine Ratio 0.08 0.00 - 0.15 mg/mg[Cre]    MDM Category 1 tracing CBC,CMP, urine PCR d/t elevated BP -- labs wnl, pt asymptomatic, & BPs  normalized. No severe range BPs Rocephin & azithromycin in MAU as tx for gonorrhea Assessment and Plan  A: 1. Gonorrhea affecting pregnancy in third trimester   2. Elevated BP without diagnosis of hypertension   3. Vomiting or nausea of pregnancy    P: Discharge home Rx phenergan Continue flagyl as prescribed Discussed reasons to return to MAU Call office for f/u appt tomorrow or Monday & to check BP  Partner to [facility] for treatment of gonorrhea -- no intercourse until 1 week after he's tx   Judeth Horn 06/14/2016, 4:58 PM

## 2016-06-14 NOTE — Discharge Instructions (Signed)
Hypertension During Pregnancy °Hypertension, commonly called high blood pressure, is when the force of blood pumping through your arteries is too strong. Arteries are blood vessels that carry blood from the heart throughout the body. Hypertension during pregnancy can cause problems for you and your baby. Your baby may be born early (prematurely) or may not weigh as much as he or she should at birth. Very bad cases of hypertension during pregnancy can be life-threatening. °Different types of hypertension can occur during pregnancy. These include: °· Chronic hypertension. This happens when: °¨ You have hypertension before pregnancy and it continues during pregnancy. °¨ You develop hypertension before you are [redacted] weeks pregnant, and it continues during pregnancy. °· Gestational hypertension. This is hypertension that develops after the 20th week of pregnancy. °· Preeclampsia, also called toxemia of pregnancy. This is a very serious type of hypertension that develops only during pregnancy. It affects the whole body, and it can be very dangerous for you and your baby. °Gestational hypertension and preeclampsia usually go away within 6 weeks after your baby is born. Women who have hypertension during pregnancy have a greater chance of developing hypertension later in life or during future pregnancies. °What are the causes? °The exact cause of hypertension is not known. °What increases the risk? °There are certain factors that make it more likely for you to develop hypertension during pregnancy. These include: °· Having hypertension during a previous pregnancy or prior to pregnancy. °· Being overweight. °· Being older than age 40. °· Being pregnant for the first time or being pregnant with more than one baby. °· Becoming pregnant using fertilization methods such as IVF (in vitro fertilization). °· Having diabetes, kidney problems, or systemic lupus erythematosus. °· Having a family history of hypertension. °What are the  signs or symptoms? °Chronic hypertension and gestational hypertension rarely cause symptoms. Preeclampsia causes symptoms, which may include: °· Increased protein in your urine. Your health care provider will check for this at every visit before you give birth (prenatal visit). °· Severe headaches. °· Sudden weight gain. °· Swelling of the hands, face, legs, and feet. °· Nausea and vomiting. °· Vision problems, such as blurred or double vision. °· Numbness in the face, arms, legs, and feet. °· Dizziness. °· Slurred speech. °· Sensitivity to bright lights. °· Abdominal pain. °· Convulsions. °How is this diagnosed? °You may be diagnosed with hypertension during a routine prenatal exam. At each prenatal visit, you may: °· Have a urine test to check for high amounts of protein in your urine. °· Have your blood pressure checked. A blood pressure reading is recorded as two numbers, such as "120 over 80" (or 120/80). The first ("top") number is called the systolic pressure. It is a measure of the pressure in your arteries when your heart beats. The second ("bottom") number is called the diastolic pressure. It is a measure of the pressure in your arteries as your heart relaxes between beats. Blood pressure is measured in a unit called mm Hg. A normal blood pressure reading is: °¨ Systolic: below 120. °¨ Diastolic: below 80. °The type of hypertension that you are diagnosed with depends on your test results and when your symptoms developed. °· Chronic hypertension is usually diagnosed before 20 weeks of pregnancy. °· Gestational hypertension is usually diagnosed after 20 weeks of pregnancy. °· Hypertension with high amounts of protein in the urine is diagnosed as preeclampsia. °· Blood pressure measurements that stay above 160 systolic, or above 110 diastolic, are signs of severe preeclampsia. °  How is this treated? Treatment for hypertension during pregnancy varies depending on the type of hypertension you have and how  serious it is.  If you take medicines called ACE inhibitors to treat chronic hypertension, you may need to switch medicines. ACE inhibitors should not be taken during pregnancy.  If you have gestational hypertension, you may need to take blood pressure medicine.  If you are at risk for preeclampsia, your health care provider may recommend that you take a low-dose aspirin every day to prevent high blood pressure during your pregnancy.  If you have severe preeclampsia, you may need to be hospitalized so you and your baby can be monitored closely. You may also need to take medicine (magnesium sulfate) to prevent seizures and to lower blood pressure. This medicine may be given as an injection or through an IV tube.  In some cases, if your condition gets worse, you may need to deliver your baby early. Follow these instructions at home: Eating and drinking   Drink enough fluid to keep your urine clear or pale yellow.  Eat a healthy diet that is low in salt (sodium). Do not add salt to your food. Check food labels to see how much sodium a food or beverage contains. Lifestyle   Do not use any products that contain nicotine or tobacco, such as cigarettes and e-cigarettes. If you need help quitting, ask your health care provider.  Do not use alcohol.  Avoid caffeine.  Avoid stress as much as possible. Rest and get plenty of sleep. General instructions   Take over-the-counter and prescription medicines only as told by your health care provider.  While lying down, lie on your left side. This keeps pressure off your baby.  While sitting or lying down, raise (elevate) your feet. Try putting some pillows under your lower legs.  Exercise regularly. Ask your health care provider what kinds of exercise are best for you.  Keep all prenatal and follow-up visits as told by your health care provider. This is important. Contact a health care provider if:  You have symptoms that your health care  provider told you may require more treatment or monitoring, such as:  Fever.  Vomiting.  Headache. Get help right away if:  You have severe abdominal pain or vomiting that does not get better with treatment.  You suddenly develop swelling in your hands, ankles, or face.  You gain 4 lbs (1.8 kg) or more in 1 week.  You develop vaginal bleeding, or you have blood in your urine.  You do not feel your baby moving as much as usual.  You have blurred or double vision.  You have muscle twitching or sudden tightening (spasms).  You have shortness of breath.  Your lips or fingernails turn blue. This information is not intended to replace advice given to you by your health care provider. Make sure you discuss any questions you have with your health care provider. Document Released: 12/12/2010 Document Revised: 10/14/2015 Document Reviewed: 09/09/2015 Elsevier Interactive Patient Education  2017 [employer] Gonorrhea Gonorrhea is a sexually transmitted disease (STD) that can affect both men and women. If left untreated, this infection can:  Damage the female or female organs.  Cause women and men to be unable to have children (be sterile).  Harm a fetus if an infected woman is pregnant. It is important to get treatment for gonorrhea as soon as possible. It is also necessary for all of your sexual partners to be tested for the infection. What  are the causes? This condition is caused by bacteria called Neisseria gonorrhoeae. The infection is spread from person to person through sexual contact, including oral, anal, and vaginal sex. A newborn can contract the infection from his or her mother during birth. What increases the risk? The following factors may make you more likely to develop this condition:  Being a woman who is younger than 22 years of age and who is sexually active.  Being a woman 28 years of age or older who has:  A new sex partner.  More than one sex partner.  A  sex partner who has an STD.  Being a man who has:  A new sex partner.  More than one sex partner.  A sex partner who has an STD.  Using condoms inconsistently.  Currently having, or having previously had, an STD.  Exchanging sex for money or drugs. What are the signs or symptoms? Some people do not have any symptoms. If you do have symptoms, they may be different for females and males. For females   Pain in the lower abdomen.  Abnormal vaginal discharge. The discharge may be cloudy, thick, or yellow-green in color.  Bleeding between periods.  Painful sex.  Burning or itching in and around the vagina.  Pain or burning when urinating.  Irritation, pain, bleeding, or discharge from the rectum. This may occur if the infection was spread by anal sex.  Sore throat or swollen lymph nodes in the neck. This may occur if the infection was spread by oral sex. For males   Abnormal discharge from the penis. This discharge may be cloudy, thick, or yellow-green in color.  Pain or burning during urination.  Pain or swelling in the testicles.  Irritation, pain, bleeding, or discharge from the rectum. This may occur if the infection was spread by anal sex.  Sore throat, fever, or swollen lymph nodes in the neck. This may occur if the infection was spread by oral sex. How is this diagnosed? This condition is diagnosed based on:  A physical exam.  A sample of discharge that is examined under a microscope to look for the bacteria. The discharge may be taken from the urethra, cervix, throat, or rectum.  Urine tests. Not all of test results will be available during your visit. How is this treated? This condition is treated with antibiotic medicines. It is important for treatment to begin as soon as possible. Early treatment may prevent some problems from developing. Do not have sex during treatment. Avoid all types of sexual activity for 7 days after treatment is complete and until  any sex partners have been treated. Follow these instructions at home:  Take over-the-counter and prescription medicines only as told by your health care provider.  Take your antibiotic medicine as told by your health care provider. Do not stop taking the antibiotic even if you start to feel better.  Do not have sex until at least 7 days after you and your partner(s) have finished treatment and your health care provider says it is okay.  It is your responsibility to get your test results. Ask your health care provider, or the department performing the test, when your results will be ready.  If you test positive for gonorrhea, inform your recent sexual partners. This includes any oral, anal, or vaginal sex partners. They need to be checked for gonorrhea even if they do not have symptoms. They may need treatment, even if they test negative for gonorrhea.  Keep all follow-up  visits as told by your health care provider. This is important. How is this prevented?  Use latex condoms correctly every time you have sexual intercourse.  Ask if your sexual partner has been tested for STDs and had negative results.  Avoid having multiple sexual partners. Contact a health care provider if:  You develop a bad reaction to the medicine you were prescribed. This may include:  A rash.  Nausea.  Vomiting.  Diarrhea.  Your symptoms do not get better after a few days of taking antibiotics.  Your symptoms get worse.  You develop new symptoms.  Your pain gets worse.  You have a fever.  You develop pain, itching, or discharge around the eyes. Get help right away if:  You feel dizzy or faint.  You have trouble breathing or have shortness of breath.  You develop an irregular heartbeat.  You have severe abdominal pain with or without shoulder pain.  You develop any bumps or sores (lesions) on your skin.  You develop warmth, redness, pain, or swelling around your joints, such as the  knee. Summary  Gonorrhea is an STDthat can affect both men and women.  This condition is caused by bacteria called Neisseria gonorrhoeae. The infection is spread from person to person, usually through sexual contact, including oral, anal, and vaginal sex.  Symptoms vary between males and females. Generally, they include abnormal discharge and burning during urination. Women may also experience painful sex, itching around the vagina, and bleeding between menstrual periods. Men may also experience swelling of the testicles.  This condition is treated with antibiotic medicines. Do not have sex until at least 7 days after completing antibiotic treatment.  If left untreated, gonorrhea can have serious side effects and complications. This information is not intended to replace advice given to you by your health care provider. Make sure you discuss any questions you have with your health care provider. Document Released: 03/23/2000 Document Revised: 02/24/2016 Document Reviewed: 02/24/2016 Elsevier Interactive Patient Education  2017 [employer]

## 2016-06-25 ENCOUNTER — Inpatient Hospital Stay (HOSPITAL_COMMUNITY)
Admission: AD | Admit: 2016-06-25 | Discharge: 2016-06-25 | Disposition: A | Discharge status: Home / Self Care | Payer: Medicaid Other | Source: Ambulatory Visit | Attending: Obstetrics and Gynecology | Admitting: Obstetrics and Gynecology

## 2016-06-25 DIAGNOSIS — F319 Bipolar disorder, unspecified: Secondary | ICD-10-CM | POA: Insufficient documentation

## 2016-06-25 DIAGNOSIS — O99343 Other mental disorders complicating pregnancy, third trimester: Secondary | ICD-10-CM | POA: Diagnosis not present

## 2016-06-25 DIAGNOSIS — Y92009 Unspecified place in unspecified non-institutional (private) residence as the place of occurrence of the external cause: Secondary | ICD-10-CM

## 2016-06-25 DIAGNOSIS — J45909 Unspecified asthma, uncomplicated: Secondary | ICD-10-CM | POA: Insufficient documentation

## 2016-06-25 DIAGNOSIS — W1789XA Other fall from one level to another, initial encounter: Secondary | ICD-10-CM | POA: Insufficient documentation

## 2016-06-25 DIAGNOSIS — O99513 Diseases of the respiratory system complicating pregnancy, third trimester: Secondary | ICD-10-CM | POA: Diagnosis not present

## 2016-06-25 DIAGNOSIS — O9A213 Injury, poisoning and certain other consequences of external causes complicating pregnancy, third trimester: Secondary | ICD-10-CM | POA: Diagnosis not present

## 2016-06-25 DIAGNOSIS — Z3400 Encounter for supervision of normal first pregnancy, unspecified trimester: Secondary | ICD-10-CM

## 2016-06-25 DIAGNOSIS — O26893 Other specified pregnancy related conditions, third trimester: Secondary | ICD-10-CM | POA: Insufficient documentation

## 2016-06-25 DIAGNOSIS — T1490XA Injury, unspecified, initial encounter: Secondary | ICD-10-CM | POA: Diagnosis present

## 2016-06-25 DIAGNOSIS — O2343 Unspecified infection of urinary tract in pregnancy, third trimester: Secondary | ICD-10-CM

## 2016-06-25 DIAGNOSIS — W19XXXA Unspecified fall, initial encounter: Secondary | ICD-10-CM

## 2016-06-25 DIAGNOSIS — Z3A34 34 weeks gestation of pregnancy: Secondary | ICD-10-CM | POA: Insufficient documentation

## 2016-06-25 MED ORDER — ACETAMINOPHEN 500 MG PO TABS
1000.0000 mg | ORAL_TABLET | Freq: Once | ORAL | Status: DC
  Filled 2016-06-25: qty 2

## 2016-06-25 NOTE — MAU Provider Note (Signed)
History     CSN: [id number]  Arrival date and time: 06/25/16 [id number]   First Provider Initiated Contact with Patient 06/25/16 0321      Chief Complaint  Patient presents with  . Abdominal Pain   HPI  Samuella CotaJalasia Tiano is a 21 yo G1P0 at 6642w0d presenting following fall from standing height.  She slipped in her kitchen and landed on her back.  As she was getting up she slipped again and hit her abdomen on the counter on the way down.  She initially stayed home, but presented to the MAU after lower abdominal cramping started around 12:30 AM accompanied by small amount of bright red vaginal blood noted when she went to the bathroom. She is feeling baby move.  No LOF.  OB History    Gravida Para Term Preterm AB Living   1             SAB TAB Ectopic Multiple Live Births                  Past Medical History:  Diagnosis Date  . Asthma    as a child  . Bipolar 1 disorder (HCC)   . Defiant behavior   . Eczema     Past Surgical History:  Procedure Laterality Date  . TONSILLECTOMY      No family history on file.  Social History  Substance Use Topics  . Smoking status: Never Smoker  . Smokeless tobacco: Never Used  . Alcohol use No    Allergies: No Known Allergies  Prescriptions Prior to Admission  Medication Sig Dispense Refill Last Dose  . metroNIDAZOLE (FLAGYL) 500 MG tablet Take 1 tablet (500 mg total) by mouth 2 (two) times daily. 14 tablet 0 Past Month at Unknown time  . Prenatal Vit-Fe Fumarate-FA (PRENATAL MULTIVITAMIN) TABS tablet Take 1 tablet by mouth daily.    06/24/2016 at Unknown time  . albuterol (PROVENTIL HFA;VENTOLIN HFA) 108 (90 Base) MCG/ACT inhaler Inhale 2 puffs into the lungs every 6 (six) hours as needed for wheezing or shortness of breath. (Patient not taking: Reported on 06/14/2016) 1 Inhaler 2 Not Taking at Unknown time  . promethazine (PHENERGAN) 25 MG tablet Take 1 tablet (25 mg total) by mouth every 6 (six) hours as needed for nausea or vomiting. 30  tablet 0     Review of Systems  Constitutional: Negative for fever.  Respiratory: Negative for shortness of breath.   Cardiovascular: Negative for chest pain and leg swelling.  Gastrointestinal: Positive for abdominal pain. Negative for nausea.  Genitourinary: Positive for vaginal bleeding. Negative for dysuria and pelvic pain.  Neurological: Negative for headaches.   Physical Exam   Blood pressure 137/81, pulse 99, temperature 98.5 F (36.9 C), temperature source Oral, resp. rate 16, height 5\' 6"  (1.676 m), weight 214 lb (97.1 kg), last menstrual period 10/31/2015.  Physical Exam  Constitutional: She is oriented to person, place, and time. She appears well-developed and well-nourished. No distress.  HENT:  Head: Normocephalic.  Right Ear: External ear normal.  Left Ear: External ear normal.  Mouth/Throat: Oropharynx is clear and moist.  Eyes: Conjunctivae and EOM are normal.  Neck: Normal range of motion. Neck supple.  Cardiovascular: Normal rate and regular rhythm.   No murmur heard. Respiratory: Effort normal and breath sounds normal. She has no wheezes. She has no rales.  GI: Soft.  Abdomen gravid, very mild TTP suprapubic, no uterine tenderness otherwise, no guarding, no rebound  Musculoskeletal: She exhibits no edema.  Neurological: She is alert and oriented to person, place, and time.    MAU Course  Procedures  MDM trauma monitoring with reactive strip and no contractions Rh positive  Assessment and Plan  21 yo G1P0 at [redacted]w[redacted]d presents following fall from standing height and hitting abdomen on counter.   -no evidence for abruption, reactive strip >4 hours from initial injury -DC home -follow up as scheduled for routine prenatal care.  Charlsie Merles 06/25/2016, 3:51 AM

## 2016-06-25 NOTE — MAU Note (Signed)
Tylenol taken to patient. Patient does not want, states pain is better.

## 2016-06-25 NOTE — MAU Note (Signed)
Pt 34wks and slipped on kitchen floor around 2230 and hit her right side of abdomen on counter top upon getting up off the floor.  States she noticed low abd cramping and red spotting since 0130.  Pt reports not feeling any fetal movement since 1730 this evening.

## 2016-06-25 NOTE — MAU Note (Signed)
Assumed care of patient at 0300.

## 2016-06-25 NOTE — MAU Note (Signed)
Pt presents via EMS with pain in her abdomen following a fall. States she slipped and fell at 2230 on her right side of her abdomen and then hit her top part of her abdomen on the counter when she was trying to stand up. Denies leaking. Reports spotting when she wipes. Last intercourse a month ago. Reports no movement since 5pm. FHT heard in triage.

## 2016-06-26 ENCOUNTER — Encounter: Payer: Self-pay | Admitting: *Deleted

## 2016-06-26 ENCOUNTER — Encounter: Payer: Self-pay | Admitting: Certified Nurse Midwife

## 2016-06-26 ENCOUNTER — Ambulatory Visit (INDEPENDENT_AMBULATORY_CARE_PROVIDER_SITE_OTHER): Payer: Medicaid Other | Admitting: Certified Nurse Midwife

## 2016-06-26 VITALS — BP 137/88 | HR 99 | Wt 218.0 lb

## 2016-06-26 DIAGNOSIS — Z3400 Encounter for supervision of normal first pregnancy, unspecified trimester: Secondary | ICD-10-CM

## 2016-06-26 DIAGNOSIS — N760 Acute vaginitis: Secondary | ICD-10-CM

## 2016-06-26 DIAGNOSIS — B9689 Other specified bacterial agents as the cause of diseases classified elsewhere: Secondary | ICD-10-CM

## 2016-06-26 DIAGNOSIS — O9A213 Injury, poisoning and certain other consequences of external causes complicating pregnancy, third trimester: Secondary | ICD-10-CM

## 2016-06-26 DIAGNOSIS — O98813 Other maternal infectious and parasitic diseases complicating pregnancy, third trimester: Secondary | ICD-10-CM

## 2016-06-26 DIAGNOSIS — B3731 Acute candidiasis of vulva and vagina: Secondary | ICD-10-CM

## 2016-06-26 DIAGNOSIS — B373 Candidiasis of vulva and vagina: Secondary | ICD-10-CM

## 2016-06-26 DIAGNOSIS — O2343 Unspecified infection of urinary tract in pregnancy, third trimester: Secondary | ICD-10-CM

## 2016-06-26 MED ORDER — TERCONAZOLE 0.8 % VA CREA: 1.0000 | TOPICAL_CREAM | Freq: Every day | VAGINAL | 0 refills | Status: DC

## 2016-06-26 MED ORDER — METRONIDAZOLE 0.75 % VA GEL: 1.0000 | Freq: Two times a day (BID) | VAGINAL | 0 refills | Status: DC

## 2016-06-26 MED ORDER — PRENATE PIXIE 10-0.6-0.4-200 MG PO CAPS: 1.0000 | ORAL_CAPSULE | Freq: Every day | ORAL | 12 refills | Status: DC

## 2016-06-26 MED ORDER — FLUCONAZOLE 150 MG PO TABS: 150.0000 mg | ORAL_TABLET | Freq: Once | ORAL | 0 refills | Status: AC

## 2016-06-26 MED ORDER — PRENATAL MULTIVITAMIN CH: 1.0000 | ORAL_TABLET | Freq: Every day | ORAL | 5 refills | Status: DC

## 2016-06-26 NOTE — Progress Notes (Signed)
   PRENATAL VISIT NOTE  Subjective:  Holly Greene is a 21 y.o. G1P0 at 5728w1d being seen today for ongoing prenatal care.  She is currently monitored for the following issues for this low-risk pregnancy and has Supervision of normal first pregnancy, antepartum and UTI (urinary tract infection) during pregnancy on her problem list.  Patient reports no complaints.  Contractions: Not present. Vag. Bleeding: None.  Movement: Present. Denies leaking of fluid.   The following portions of the patient's history were reviewed and updated as appropriate: allergies, current medications, past family history, past medical history, past social history, past surgical history and problem list. Problem list updated.  Objective:   Vitals:   06/26/16 1309  BP: 137/88  Pulse: 99  Weight: 218 lb (98.9 kg)    Fetal Status: Fetal Heart Rate (bpm): 145 Fundal Height: 34 cm Movement: Present     General:  Alert, oriented and cooperative. Patient is in no acute distress.  Skin: Skin is warm and dry. No rash noted.   Cardiovascular: Normal heart rate noted  Respiratory: Normal respiratory effort, no problems with respiration noted  Abdomen: Soft, gravid, appropriate for gestational age. Pain/Pressure: Absent     Pelvic:  Cervical exam deferred        Extremities: Normal range of motion.  Edema: None  Mental Status: Normal mood and affect. Normal behavior. Normal judgment and thought content.   Assessment and Plan:  Pregnancy: G1P0 at 6528w1d  1. Supervision of normal first pregnancy, antepartum      Recently seen in MAU for fall on abdomen and back.  Denies any contractions, bleeding or leaking. - Prenat-FeAsp-Meth-FA-DHA w/o A (PRENATE PIXIE) 10-0.6-0.4-200 MG CAPS; Take 1 tablet by mouth daily.  Dispense: 30 capsule; Refill: 12  2. Traumatic injury during pregnancy, third trimester     - US MFM OB FOLLOW UP; Future  3. BV (bacterial vaginosis)      Unable to tolerate oral Flagyl - metroNIDAZOLE  (METROGEL VAGINAL) 0.75 % vaginal gel; Place 1 Applicatorful vaginally 2 (two) times daily.  Dispense: 70 g; Refill: 0  4. Yeast infection of the vagina     - fluconazole (DIFLUCAN) 150 MG tablet; Take 1 tablet (150 mg total) by mouth once.  Dispense: 1 tablet; Refill: 0 - terconazole (TERAZOL 3) 0.8 % vaginal cream; Place 1 applicator vaginally at bedtime.  Dispense: 20 g; Refill: 0  5. Urinary tract infection in mother during third trimester of pregnancy     TOC next ROB  Preterm labor symptoms and general obstetric precautions including but not limited to vaginal bleeding, contractions, leaking of fluid and fetal movement were reviewed in detail with the patient. Please refer to After Visit Summary for other counseling recommendations.  Return in about 1 week (around 07/03/2016) for ROB, GBS.   Roe Coombsachelle A Matasha Smigelski, CNM

## 2016-06-26 NOTE — Progress Notes (Signed)
Patient reports good fetal movement, denies bleeding and contractions.

## 2016-06-27 ENCOUNTER — Ambulatory Visit (HOSPITAL_COMMUNITY)
Admission: RE | Admit: 2016-06-27 | Discharge: 2016-06-27 | Disposition: A | Discharge status: Home / Self Care | Payer: Medicaid Other | Source: Ambulatory Visit | Attending: Certified Nurse Midwife | Admitting: Certified Nurse Midwife

## 2016-06-27 DIAGNOSIS — Z3A34 34 weeks gestation of pregnancy: Secondary | ICD-10-CM | POA: Insufficient documentation

## 2016-06-27 DIAGNOSIS — O9A213 Injury, poisoning and certain other consequences of external causes complicating pregnancy, third trimester: Secondary | ICD-10-CM

## 2016-06-27 DIAGNOSIS — Z362 Encounter for other antenatal screening follow-up: Secondary | ICD-10-CM | POA: Diagnosis not present

## 2016-06-28 ENCOUNTER — Other Ambulatory Visit: Payer: Self-pay | Admitting: Certified Nurse Midwife

## 2016-06-28 DIAGNOSIS — Z3400 Encounter for supervision of normal first pregnancy, unspecified trimester: Secondary | ICD-10-CM

## 2016-07-05 ENCOUNTER — Encounter: Payer: Self-pay | Admitting: Certified Nurse Midwife

## 2016-07-16 ENCOUNTER — Ambulatory Visit (INDEPENDENT_AMBULATORY_CARE_PROVIDER_SITE_OTHER): Payer: Self-pay | Admitting: Pediatrics

## 2016-07-16 DIAGNOSIS — Z3490 Encounter for supervision of normal pregnancy, unspecified, unspecified trimester: Secondary | ICD-10-CM

## 2016-07-16 DIAGNOSIS — Z7681 Expectant parent(s) prebirth pediatrician visit: Secondary | ICD-10-CM

## 2016-07-18 NOTE — Progress Notes (Signed)
Prenatal counseling for impending newborn done--1st child, 37wks, no complications known, prenatal 6wks Z90.81

## 2016-07-23 ENCOUNTER — Encounter: Payer: Self-pay | Admitting: Certified Nurse Midwife

## 2016-07-23 ENCOUNTER — Other Ambulatory Visit (HOSPITAL_COMMUNITY)
Admission: RE | Admit: 2016-07-23 | Discharge: 2016-07-23 | Disposition: A | Discharge status: Home / Self Care | Payer: Medicaid Other | Source: Ambulatory Visit | Attending: Certified Nurse Midwife | Admitting: Certified Nurse Midwife

## 2016-07-23 ENCOUNTER — Ambulatory Visit (INDEPENDENT_AMBULATORY_CARE_PROVIDER_SITE_OTHER): Payer: Medicaid Other | Admitting: Certified Nurse Midwife

## 2016-07-23 VITALS — BP 126/75 | HR 84 | Wt 228.0 lb

## 2016-07-23 DIAGNOSIS — B373 Candidiasis of vulva and vagina: Secondary | ICD-10-CM | POA: Insufficient documentation

## 2016-07-23 DIAGNOSIS — O98812 Other maternal infectious and parasitic diseases complicating pregnancy, second trimester: Secondary | ICD-10-CM | POA: Insufficient documentation

## 2016-07-23 DIAGNOSIS — O98212 Gonorrhea complicating pregnancy, second trimester: Secondary | ICD-10-CM | POA: Diagnosis present

## 2016-07-23 DIAGNOSIS — Z3A00 Weeks of gestation of pregnancy not specified: Secondary | ICD-10-CM | POA: Diagnosis not present

## 2016-07-23 DIAGNOSIS — Z3400 Encounter for supervision of normal first pregnancy, unspecified trimester: Secondary | ICD-10-CM

## 2016-07-23 DIAGNOSIS — Z3403 Encounter for supervision of normal first pregnancy, third trimester: Secondary | ICD-10-CM

## 2016-07-23 DIAGNOSIS — O2343 Unspecified infection of urinary tract in pregnancy, third trimester: Secondary | ICD-10-CM

## 2016-07-23 NOTE — Progress Notes (Signed)
   PRENATAL VISIT NOTE  Subjective:  Holly Greene is a 21 y.o. G1P0 at [redacted]w[redacted]d being seen today for ongoing prenatal care.  She is currently monitored for the following issues for this low-risk pregnancy and has Supervision of normal first pregnancy, antepartum and UTI (urinary tract infection) during pregnancy on her problem list.  Patient reports backache, no bleeding, no leaking and occasional contractions.  Contractions: Irregular. Vag. Bleeding: None.  Movement: Present. Denies leaking of fluid.   The following portions of the patient's history were reviewed and updated as appropriate: allergies, current medications, past family history, past medical history, past social history, past surgical history and problem list. Problem list updated.  Objective:   Vitals:   07/23/16 1316  BP: 126/75  Pulse: 84  Weight: 228 lb (103.4 kg)    Fetal Status: Fetal Heart Rate (bpm): 141 Fundal Height: 39 cm Movement: Present  Presentation: Vertex  General:  Alert, oriented and cooperative. Patient is in no acute distress.  Skin: Skin is warm and dry. No rash noted.   Cardiovascular: Normal heart rate noted  Respiratory: Normal respiratory effort, no problems with respiration noted  Abdomen: Soft, gravid, appropriate for gestational age. Pain/Pressure: Present     Pelvic:  Cervical exam performed Dilation: 1.5 Effacement (%): 50 Station: -3  Extremities: Normal range of motion.  Edema: None  Mental Status: Normal mood and affect. Normal behavior. Normal judgment and thought content.   Assessment and Plan:  Pregnancy: G1P0 at [redacted]w[redacted]d  1. Supervision of normal first pregnancy, antepartum     Doing well.  Out of work today for the remainder of her pregnancy.   - Strep Gp B NAA  2. Urinary tract infection in mother during third trimester of pregnancy      TOC today. - Culture, OB Urine  3. Gonorrhea in pregnancy, antepartum, second trimester     TOC today - Cervicovaginal ancillary  only  Term labor symptoms and general obstetric precautions including but not limited to vaginal bleeding, contractions, leaking of fluid and fetal movement were reviewed in detail with the patient. Please refer to After Visit Summary for other counseling recommendations.  Return in about 1 week (around 07/30/2016) for ROB.   Roe Coombs, CNM

## 2016-07-24 LAB — CERVICOVAGINAL ANCILLARY ONLY
Bacterial vaginitis: NEGATIVE
Candida vaginitis: POSITIVE — AB
Chlamydia: NEGATIVE
NEISSERIA GONORRHEA: NEGATIVE
TRICH (WINDOWPATH): NEGATIVE

## 2016-07-24 LAB — OB RESULTS CONSOLE GBS: STREP GROUP B AG: POSITIVE

## 2016-07-25 ENCOUNTER — Encounter (HOSPITAL_COMMUNITY): Payer: Self-pay

## 2016-07-25 ENCOUNTER — Other Ambulatory Visit: Payer: Self-pay | Admitting: Certified Nurse Midwife

## 2016-07-25 ENCOUNTER — Inpatient Hospital Stay (EMERGENCY_DEPARTMENT_HOSPITAL)
Admission: AD | Admit: 2016-07-25 | Discharge: 2016-07-25 | Disposition: A | Discharge status: Home / Self Care | Payer: Medicaid Other | Source: Ambulatory Visit | Attending: Obstetrics and Gynecology | Admitting: Obstetrics and Gynecology

## 2016-07-25 DIAGNOSIS — Z3A38 38 weeks gestation of pregnancy: Secondary | ICD-10-CM

## 2016-07-25 DIAGNOSIS — O26893 Other specified pregnancy related conditions, third trimester: Secondary | ICD-10-CM

## 2016-07-25 DIAGNOSIS — B3731 Acute candidiasis of vulva and vagina: Secondary | ICD-10-CM

## 2016-07-25 DIAGNOSIS — R51 Headache: Secondary | ICD-10-CM

## 2016-07-25 DIAGNOSIS — O9989 Other specified diseases and conditions complicating pregnancy, childbirth and the puerperium: Secondary | ICD-10-CM | POA: Diagnosis not present

## 2016-07-25 DIAGNOSIS — R030 Elevated blood-pressure reading, without diagnosis of hypertension: Secondary | ICD-10-CM | POA: Insufficient documentation

## 2016-07-25 DIAGNOSIS — B373 Candidiasis of vulva and vagina: Secondary | ICD-10-CM

## 2016-07-25 DIAGNOSIS — O163 Unspecified maternal hypertension, third trimester: Secondary | ICD-10-CM

## 2016-07-25 DIAGNOSIS — O99820 Streptococcus B carrier state complicating pregnancy: Secondary | ICD-10-CM

## 2016-07-25 LAB — COMPREHENSIVE METABOLIC PANEL
ALT: 12 U/L — ABNORMAL LOW (ref 14–54)
ANION GAP: 9 (ref 5–15)
AST: 47 U/L — ABNORMAL HIGH (ref 15–41)
Albumin: 2.9 g/dL — ABNORMAL LOW (ref 3.5–5.0)
Alkaline Phosphatase: 108 U/L (ref 38–126)
BUN: 5 mg/dL — ABNORMAL LOW (ref 6–20)
CHLORIDE: 105 mmol/L (ref 101–111)
CO2: 22 mmol/L (ref 22–32)
Calcium: 8.5 mg/dL — ABNORMAL LOW (ref 8.9–10.3)
Creatinine, Ser: 0.69 mg/dL (ref 0.44–1.00)
Glucose, Bld: 93 mg/dL (ref 65–99)
POTASSIUM: 3.2 mmol/L — AB (ref 3.5–5.1)
Sodium: 136 mmol/L (ref 135–145)
Total Bilirubin: 0.4 mg/dL (ref 0.3–1.2)
Total Protein: 6.2 g/dL — ABNORMAL LOW (ref 6.5–8.1)

## 2016-07-25 LAB — CBC
HCT: 32.4 % — ABNORMAL LOW (ref 36.0–46.0)
Hemoglobin: 11 g/dL — ABNORMAL LOW (ref 12.0–15.0)
MCH: 30.9 pg (ref 26.0–34.0)
MCHC: 34 g/dL (ref 30.0–36.0)
MCV: 91 fL (ref 78.0–100.0)
PLATELETS: 234 10*3/uL (ref 150–400)
RBC: 3.56 MIL/uL — ABNORMAL LOW (ref 3.87–5.11)
RDW: 12.7 % (ref 11.5–15.5)
WBC: 12.7 10*3/uL — AB (ref 4.0–10.5)

## 2016-07-25 LAB — PROTEIN / CREATININE RATIO, URINE
Creatinine, Urine: 64 mg/dL
Protein Creatinine Ratio: 0.11 mg/mg{Cre} (ref 0.00–0.15)
TOTAL PROTEIN, URINE: 7 mg/dL

## 2016-07-25 LAB — URINALYSIS, ROUTINE W REFLEX MICROSCOPIC
Bilirubin Urine: NEGATIVE
Glucose, UA: NEGATIVE mg/dL
KETONES UR: NEGATIVE mg/dL
Nitrite: NEGATIVE
PROTEIN: NEGATIVE mg/dL
Specific Gravity, Urine: 1.004 — ABNORMAL LOW (ref 1.005–1.030)
pH: 7 (ref 5.0–8.0)

## 2016-07-25 LAB — STREP GP B NAA: Strep Gp B NAA: POSITIVE — AB

## 2016-07-25 MED ORDER — FLUCONAZOLE 150 MG PO TABS: 150.0000 mg | ORAL_TABLET | Freq: Once | ORAL | 0 refills | Status: AC

## 2016-07-25 MED ORDER — ACETAMINOPHEN 500 MG PO TABS
1000.0000 mg | ORAL_TABLET | Freq: Once | ORAL | Status: AC
  Administered 2016-07-25: 1000 mg via ORAL
  Filled 2016-07-25: qty 2

## 2016-07-25 NOTE — MAU Provider Note (Signed)
Chief Complaint:  Labor Eval   First Provider Initiated Contact with Patient 07/25/16 0214      HPI: Holly Greene is a 21 y.o. G1P0 at [redacted]w[redacted]d who presents to maternity admissions reporting irregular contractions starting 2-3 hours ago. She reports tightening in her abdomen and pain that feels like menstrual cramps, but reports they are irregular so she is unable to time them.  She also reports a mild h/a today that is similar to previous h/a in the pregnancy but this one is less severe than usual.  She has no tried any treatments for contractions or for h/a.  Nothing makes her symptoms better or worse. Her abdominal pain is intermittent.  Her h/a is constant and frontal.  Both her h/a and abdominal pain are unchanged since onset.  She denies any hx of HTN during pregnancy or outside of pregnancy.  She denies visual disturbances or epigastric pain. She reports good fetal movement, denies LOF, vaginal bleeding, vaginal itching/burning, urinary symptoms, dizziness, n/v, or fever/chills.    HPI  Past Medical History: Past Medical History:  Diagnosis Date  . Asthma    as a child  . Bipolar 1 disorder (HCC)   . Defiant behavior   . Eczema     Past obstetric history: OB History  Gravida Para Term Preterm AB Living  1            SAB TAB Ectopic Multiple Live Births               # Outcome Date GA Lbr Len/2nd Weight Sex Delivery Anes PTL Lv  1 Current               Past Surgical History: Past Surgical History:  Procedure Laterality Date  . TONSILLECTOMY      Family History: No family history on file.  Social History: Social History  Substance Use Topics  . Smoking status: Never Smoker  . Smokeless tobacco: Never Used  . Alcohol use No    Allergies: No Known Allergies  Meds:  Prescriptions Prior to Admission  Medication Sig Dispense Refill Last Dose  . Prenat-FeAsp-Meth-FA-DHA w/o A (PRENATE PIXIE) 10-0.6-0.4-200 MG CAPS Take 1 tablet by mouth daily. 30 capsule 12  07/24/2016 at Unknown time  . albuterol (PROVENTIL HFA;VENTOLIN HFA) 108 (90 Base) MCG/ACT inhaler Inhale 2 puffs into the lungs every 6 (six) hours as needed for wheezing or shortness of breath. (Patient not taking: Reported on 06/14/2016) 1 Inhaler 2 Not Taking    ROS:  Review of Systems  Constitutional: Negative for chills, fatigue and fever.  Eyes: Negative for visual disturbance.  Respiratory: Negative for shortness of breath.   Cardiovascular: Negative for chest pain.  Gastrointestinal: Negative for abdominal pain, nausea and vomiting.  Genitourinary: Negative for difficulty urinating, dysuria, flank pain, pelvic pain, vaginal bleeding, vaginal discharge and vaginal pain.  Neurological: Positive for headaches. Negative for dizziness.  Psychiatric/Behavioral: Negative.      I have reviewed patient's Past Medical Hx, Surgical Hx, Family Hx, Social Hx, medications and allergies.   Physical Exam   Patient Vitals for the past 24 hrs:  BP Temp Temp src Pulse Resp SpO2  07/25/16 0400 127/69 98.1 F (36.7 C) Oral 87 18 -  07/25/16 0345 121/64 - - 86 - -  07/25/16 0330 119/65 - - 75 - -  07/25/16 0315 121/68 - - 76 - -  07/25/16 0300 124/75 - - 77 - -  07/25/16 0245 123/70 - - 82 - -  07/25/16  0215 133/86 - - 84 - -  07/25/16 0200 (!) 141/93 - - 91 - -  07/25/16 0154 136/90 - - 89 - -  07/25/16 0151 (!) 151/97 98 F (36.7 C) Oral 87 18 99 %   Constitutional: Well-developed, well-nourished female in no acute distress.  Cardiovascular: normal rate Respiratory: normal effort GI: Abd soft, non-tender, gravid appropriate for gestational age.  MS: Extremities nontender, no edema, normal ROM Neurologic: Alert and oriented x 4.  GU: Neg CVAT.   Dilation: 1.5 Effacement (%): 50 Station: -3 Exam by:: Pharmacologist  FHT:  Baseline 135 , moderate variability, accelerations present, no decelerations Contractions: q 10-15 mins, mild to palpation   Labs: Results for orders placed  or performed during the hospital encounter of 07/25/16 (from the past 24 hour(s))  CBC     Status: Abnormal   Collection Time: 07/25/16  2:22 AM  Result Value Ref Range   WBC 12.7 (H) 4.0 - 10.5 K/uL   RBC 3.56 (L) 3.87 - 5.11 MIL/uL   Hemoglobin 11.0 (L) 12.0 - 15.0 g/dL   HCT 40.9 (L) 81.1 - 91.4 %   MCV 91.0 78.0 - 100.0 fL   MCH 30.9 26.0 - 34.0 pg   MCHC 34.0 30.0 - 36.0 g/dL   RDW 78.2 95.6 - 21.3 %   Platelets 234 150 - 400 K/uL  Comprehensive metabolic panel     Status: Abnormal   Collection Time: 07/25/16  2:22 AM  Result Value Ref Range   Sodium 136 135 - 145 mmol/L   Potassium 3.2 (L) 3.5 - 5.1 mmol/L   Chloride 105 101 - 111 mmol/L   CO2 22 22 - 32 mmol/L   Glucose, Bld 93 65 - 99 mg/dL   BUN 5 (L) 6 - 20 mg/dL   Creatinine, Ser 0.86 0.44 - 1.00 mg/dL   Calcium 8.5 (L) 8.9 - 10.3 mg/dL   Total Protein 6.2 (L) 6.5 - 8.1 g/dL   Albumin 2.9 (L) 3.5 - 5.0 g/dL   AST 47 (H) 15 - 41 U/L   ALT 12 (L) 14 - 54 U/L   Alkaline Phosphatase 108 38 - 126 U/L   Total Bilirubin 0.4 0.3 - 1.2 mg/dL   GFR calc non Af Amer >60 >60 mL/min   GFR calc Af Amer >60 >60 mL/min   Anion gap 9 5 - 15  Protein / creatinine ratio, urine     Status: None   Collection Time: 07/25/16  2:25 AM  Result Value Ref Range   Creatinine, Urine 64.00 mg/dL   Total Protein, Urine 7 mg/dL   Protein Creatinine Ratio 0.11 0.00 - 0.15 mg/mg[Cre]  Urinalysis, Routine w reflex microscopic     Status: Abnormal   Collection Time: 07/25/16  2:25 AM  Result Value Ref Range   Color, Urine STRAW (A) YELLOW   APPearance HAZY (A) CLEAR   Specific Gravity, Urine 1.004 (L) 1.005 - 1.030   pH 7.0 5.0 - 8.0   Glucose, UA NEGATIVE NEGATIVE mg/dL   Hgb urine dipstick SMALL (A) NEGATIVE   Bilirubin Urine NEGATIVE NEGATIVE   Ketones, ur NEGATIVE NEGATIVE mg/dL   Protein, ur NEGATIVE NEGATIVE mg/dL   Nitrite NEGATIVE NEGATIVE   Leukocytes, UA LARGE (A) NEGATIVE   RBC / HPF 0-5 0 - 5 RBC/hpf   WBC, UA 6-30 0 - 5  WBC/hpf   Bacteria, UA RARE (A) NONE SEEN   Squamous Epithelial / LPF 6-30 (A) NONE SEEN   O/Positive/-- (  10/05 1049)  Imaging:    MAU Course/MDM: I have ordered labs (CBC, CMP, P/C ratio, U/A) and reviewed results.  AST slightly elevated, c/w previous labwork. P/C ratio 0.11. H/a completely resolved with Tylenol 1000 mg PO x 1 dose in MAU. No evidence of preeclampsia.  BP mostly wnl during MAU stay. No evidence of active labor today NST reviewed and reactive D/C home with preeclampsia and labor precautions Pt to call office for BP check later this week Pt stable at time of discharge.   Assessment: 1. Headache in pregnancy, antepartum, third trimester   2. Elevated blood pressure affecting pregnancy in third trimester, antepartum     Plan: Discharge home Labor precautions and fetal kick counts Follow-up Information    [facility] Schedule an appointment as soon as possible for a visit.   Why:  Follow up this week for BP check, return to MAU as needed for emergencies Contact information: [address] [place] [address] [phone]         Allergies as of 07/25/2016   No Known Allergies     Medication List    TAKE these medications   albuterol 108 (90 Base) MCG/ACT inhaler Commonly known as:  PROVENTIL HFA;VENTOLIN HFA Inhale 2 puffs into the lungs every 6 (six) hours as needed for wheezing or shortness of breath.   PRENATE PIXIE 10-0.6-0.4-200 MG Caps Take 1 tablet by mouth daily.       Sharen Counter Certified Nurse-Midwife 07/25/2016 4:11 AM

## 2016-07-25 NOTE — MAU Note (Signed)
Pt c/o irregular contractions since midnight. Has not timed them. Was 1.5 cm on Monday.  Pt denies LOF, vag bleeding. +FM

## 2016-07-25 NOTE — Discharge Instructions (Signed)

## 2016-07-26 ENCOUNTER — Encounter (HOSPITAL_COMMUNITY): Payer: Self-pay

## 2016-07-26 ENCOUNTER — Ambulatory Visit: Payer: Medicaid Other | Admitting: *Deleted

## 2016-07-26 ENCOUNTER — Inpatient Hospital Stay (HOSPITAL_COMMUNITY)
Admission: AD | Admit: 2016-07-26 | Discharge: 2016-07-29 | DRG: 775 | Disposition: A | Discharge status: Home / Self Care | Payer: Medicaid Other | Source: Ambulatory Visit | Attending: Family Medicine | Admitting: Family Medicine

## 2016-07-26 ENCOUNTER — Other Ambulatory Visit: Payer: Self-pay | Admitting: Certified Nurse Midwife

## 2016-07-26 VITALS — BP 149/99 | HR 83 | Wt 226.8 lb

## 2016-07-26 DIAGNOSIS — O26893 Other specified pregnancy related conditions, third trimester: Secondary | ICD-10-CM | POA: Diagnosis not present

## 2016-07-26 DIAGNOSIS — O99824 Streptococcus B carrier state complicating childbirth: Secondary | ICD-10-CM | POA: Diagnosis present

## 2016-07-26 DIAGNOSIS — O134 Gestational [pregnancy-induced] hypertension without significant proteinuria, complicating childbirth: Secondary | ICD-10-CM | POA: Diagnosis present

## 2016-07-26 DIAGNOSIS — Z3A38 38 weeks gestation of pregnancy: Secondary | ICD-10-CM

## 2016-07-26 DIAGNOSIS — O139 Gestational [pregnancy-induced] hypertension without significant proteinuria, unspecified trimester: Secondary | ICD-10-CM | POA: Diagnosis present

## 2016-07-26 DIAGNOSIS — O133 Gestational [pregnancy-induced] hypertension without significant proteinuria, third trimester: Secondary | ICD-10-CM

## 2016-07-26 DIAGNOSIS — N3000 Acute cystitis without hematuria: Secondary | ICD-10-CM

## 2016-07-26 DIAGNOSIS — O9989 Other specified diseases and conditions complicating pregnancy, childbirth and the puerperium: Secondary | ICD-10-CM | POA: Diagnosis not present

## 2016-07-26 DIAGNOSIS — R51 Headache: Secondary | ICD-10-CM | POA: Diagnosis not present

## 2016-07-26 LAB — COMPREHENSIVE METABOLIC PANEL
ALT: 13 U/L — ABNORMAL LOW (ref 14–54)
AST: 47 U/L — ABNORMAL HIGH (ref 15–41)
Albumin: 2.9 g/dL — ABNORMAL LOW (ref 3.5–5.0)
Alkaline Phosphatase: 113 U/L (ref 38–126)
Anion gap: 8 (ref 5–15)
BILIRUBIN TOTAL: 0.6 mg/dL (ref 0.3–1.2)
BUN: 5 mg/dL — ABNORMAL LOW (ref 6–20)
CO2: 23 mmol/L (ref 22–32)
Calcium: 9 mg/dL (ref 8.9–10.3)
Chloride: 105 mmol/L (ref 101–111)
Creatinine, Ser: 0.47 mg/dL (ref 0.44–1.00)
Glucose, Bld: 94 mg/dL (ref 65–99)
POTASSIUM: 3.3 mmol/L — AB (ref 3.5–5.1)
Sodium: 136 mmol/L (ref 135–145)
TOTAL PROTEIN: 6 g/dL — AB (ref 6.5–8.1)

## 2016-07-26 LAB — CBC
HCT: 33.5 % — ABNORMAL LOW (ref 36.0–46.0)
Hemoglobin: 11.6 g/dL — ABNORMAL LOW (ref 12.0–15.0)
MCH: 31.7 pg (ref 26.0–34.0)
MCHC: 34.6 g/dL (ref 30.0–36.0)
MCV: 91.5 fL (ref 78.0–100.0)
PLATELETS: 234 10*3/uL (ref 150–400)
RBC: 3.66 MIL/uL — ABNORMAL LOW (ref 3.87–5.11)
RDW: 13.1 % (ref 11.5–15.5)
WBC: 10.5 10*3/uL (ref 4.0–10.5)

## 2016-07-26 LAB — TYPE AND SCREEN
ABO/RH(D): O POS
ANTIBODY SCREEN: NEGATIVE

## 2016-07-26 LAB — PROTEIN / CREATININE RATIO, URINE
CREATININE, URINE: 78 mg/dL
PROTEIN CREATININE RATIO: 0.09 mg/mg{creat} (ref 0.00–0.15)
Total Protein, Urine: 7 mg/dL

## 2016-07-26 LAB — ABO/RH: ABO/RH(D): O POS

## 2016-07-26 MED ORDER — MISOPROSTOL 50MCG HALF TABLET
50.0000 ug | ORAL_TABLET | Freq: Once | ORAL | Status: AC
  Administered 2016-07-26: 50 ug via ORAL
  Filled 2016-07-26: qty 1

## 2016-07-26 MED ORDER — LACTATED RINGERS IV SOLN: 500.0000 mL | INTRAVENOUS | Status: DC | PRN

## 2016-07-26 MED ORDER — PENICILLIN G POT IN DEXTROSE 60000 UNIT/ML IV SOLN
3.0000 10*6.[IU] | INTRAVENOUS | Status: DC
  Administered 2016-07-26 – 2016-07-27 (×7): 3 10*6.[IU] via INTRAVENOUS
  Filled 2016-07-26 (×14): qty 50

## 2016-07-26 MED ORDER — SOD CITRATE-CITRIC ACID 500-334 MG/5ML PO SOLN: 30.0000 mL | ORAL | Status: DC | PRN

## 2016-07-26 MED ORDER — MISOPROSTOL 25 MCG QUARTER TABLET
25.0000 ug | ORAL_TABLET | ORAL | Status: DC | PRN
  Administered 2016-07-26: 25 ug via VAGINAL
  Filled 2016-07-26 (×2): qty 1

## 2016-07-26 MED ORDER — OXYTOCIN 40 UNITS IN LACTATED RINGERS INFUSION - SIMPLE MED: 2.5000 [IU]/h | INTRAVENOUS | Status: DC

## 2016-07-26 MED ORDER — TERBUTALINE SULFATE 1 MG/ML IJ SOLN
0.2500 mg | Freq: Once | INTRAMUSCULAR | Status: DC | PRN
  Filled 2016-07-26: qty 1

## 2016-07-26 MED ORDER — LIDOCAINE HCL (PF) 1 % IJ SOLN: 30.0000 mL | INTRAMUSCULAR | Status: DC | PRN

## 2016-07-26 MED ORDER — OXYCODONE-ACETAMINOPHEN 5-325 MG PO TABS: 2.0000 | ORAL_TABLET | ORAL | Status: DC | PRN

## 2016-07-26 MED ORDER — LACTATED RINGERS IV SOLN
INTRAVENOUS | Status: DC
  Administered 2016-07-26 – 2016-07-27 (×3): via INTRAVENOUS

## 2016-07-26 MED ORDER — ACETAMINOPHEN 325 MG PO TABS: 650.0000 mg | ORAL_TABLET | ORAL | Status: DC | PRN

## 2016-07-26 MED ORDER — LACTATED RINGERS IV SOLN: INTRAVENOUS | Status: DC

## 2016-07-26 MED ORDER — OXYTOCIN BOLUS FROM INFUSION
500.0000 mL | Freq: Once | INTRAVENOUS | Status: AC
  Administered 2016-07-27: 999 mL/h via INTRAVENOUS

## 2016-07-26 MED ORDER — FLEET ENEMA 7-19 GM/118ML RE ENEM: 1.0000 | ENEMA | RECTAL | Status: DC | PRN

## 2016-07-26 MED ORDER — PENICILLIN G POTASSIUM 5000000 UNITS IJ SOLR
5.0000 10*6.[IU] | Freq: Once | INTRAMUSCULAR | Status: AC
  Administered 2016-07-26: 5 10*6.[IU] via INTRAVENOUS
  Filled 2016-07-26: qty 5

## 2016-07-26 MED ORDER — OXYCODONE-ACETAMINOPHEN 5-325 MG PO TABS: 1.0000 | ORAL_TABLET | ORAL | Status: DC | PRN

## 2016-07-26 MED ORDER — ZOLPIDEM TARTRATE 5 MG PO TABS
5.0000 mg | ORAL_TABLET | Freq: Every evening | ORAL | Status: DC | PRN
  Administered 2016-07-27: 5 mg via ORAL
  Filled 2016-07-26: qty 1

## 2016-07-26 MED ORDER — FENTANYL CITRATE (PF) 100 MCG/2ML IJ SOLN
100.0000 ug | INTRAMUSCULAR | Status: DC | PRN
  Administered 2016-07-27 (×2): 100 ug via INTRAVENOUS
  Filled 2016-07-26 (×2): qty 2

## 2016-07-26 MED ORDER — ONDANSETRON HCL 4 MG/2ML IJ SOLN: 4.0000 mg | Freq: Four times a day (QID) | INTRAMUSCULAR | Status: DC | PRN

## 2016-07-26 MED ORDER — LIDOCAINE HCL (PF) 1 % IJ SOLN
30.0000 mL | INTRAMUSCULAR | Status: DC | PRN
  Filled 2016-07-26: qty 30

## 2016-07-26 MED ORDER — OXYTOCIN BOLUS FROM INFUSION: 500.0000 mL | Freq: Once | INTRAVENOUS | Status: DC

## 2016-07-26 NOTE — H&P (Signed)
OBSTETRIC ADMISSION HISTORY AND PHYSICAL  Holly Greene is a 21 y.o. female G1P0 with IUP at [redacted]w[redacted]d by LMP presenting for IOL for gHTN. She reports +FMs, No LOF, no VB, no blurry vision, headaches or peripheral edema, and RUQ pain.  She plans on breast feeding. She request ?IUD for birth control.  Dating: By LMP c/w [place] --->  Estimated Date of Delivery: 08/06/16  Prenatal History/Complications:  Past Medical History: Past Medical History:  Diagnosis Date  . Asthma    as a child  . Bipolar 1 disorder (HCC)   . Defiant behavior   . Eczema     Past Surgical History: Past Surgical History:  Procedure Laterality Date  . TONSILLECTOMY      Obstetrical History: OB History    Gravida Para Term Preterm AB Living   1             SAB TAB Ectopic Multiple Live Births                  Social History: Social History   Social History  . Marital status: Single    Spouse name: N/A  . Number of children: N/A  . Years of education: N/A   Social History Main Topics  . Smoking status: Never Smoker  . Smokeless tobacco: Never Used  . Alcohol use No  . Drug use: Yes    Types: Marijuana     Comment: last smoked before pregnacy  . Sexual activity: Yes   Other Topics Concern  . None   Social History Narrative  . None    Family History: History reviewed. No pertinent family history.  Allergies: No Known Allergies  Prescriptions Prior to Admission  Medication Sig Dispense Refill Last Dose  . Prenat-FeAsp-Meth-FA-DHA w/o A (PRENATE PIXIE) 10-0.6-0.4-200 MG CAPS Take 1 tablet by mouth daily. 30 capsule 12 07/25/2016 at Unknown time  . albuterol (PROVENTIL HFA;VENTOLIN HFA) 108 (90 Base) MCG/ACT inhaler Inhale 2 puffs into the lungs every 6 (six) hours as needed for wheezing or shortness of breath. 1 Inhaler 2 emergency     Review of Systems   All systems reviewed and negative except as stated in HPI  Blood pressure (!) 151/97, pulse 82, temperature 98.4 F (36.9 C),  temperature source Oral, resp. rate 17, height  (1.676 m), weight 103 kg (227 lb), last menstrual period 10/31/2015. General appearance: alert, cooperative and no distress Lungs: clear to auscultation bilaterally Heart: regular rate and rhythm Abdomen: soft, non-tender; bowel sounds normal Extremities: Homans sign is negative, no sign of DVT Cervix: fingertip/thick/high Presentation: cephalic Fetal monitoringBaseline: 145 bpm, Accelerations: Reactive and Decelerations: Absent Uterine activityFrequency: none     Prenatal labs: ABO, Rh: O/Positive/-- (10/05 1049) Antibody: Negative (10/05 1049) Rubella: 4.06 RPR: Non Reactive (01/23 1037)  HBsAg: Negative (10/05 1049)  HIV: Non Reactive (01/23 1037)  GBS: Positive (04/17 0000)  2 hr Glucola: nl Genetic screening: quad neg Anatomy US: wnl  Prenatal Transfer Tool  Maternal Diabetes: No Genetic Screening: Normal Maternal Ultrasounds/Referrals: Normal Fetal Ultrasounds or other Referrals:  None Maternal Substance Abuse:  No Significant Maternal Medications:  None Significant Maternal Lab Results: None  Results for orders placed or performed during the hospital encounter of 07/26/16 (from the past 24 hour(s))  CBC   Collection Time: 07/26/16  4:40 PM  Result Value Ref Range   WBC 10.5 4.0 - 10.5 K/uL   RBC 3.66 (L) 3.87 - 5.11 MIL/uL   Hemoglobin 11.6 (L) 12.0 - 15.0 g/dL  HCT 33.5 (L) 36.0 - 46.0 %   MCV 91.5 78.0 - 100.0 fL   MCH 31.7 26.0 - 34.0 pg   MCHC 34.6 30.0 - 36.0 g/dL   RDW 16.1 09.6 - 04.5 %   Platelets 234 150 - 400 K/uL    Patient Active Problem List   Diagnosis Date Noted  . Normal labor 07/26/2016  . GBS (group B Streptococcus carrier), +RV culture, currently pregnant 07/25/2016  . UTI (urinary tract infection) during pregnancy 06/09/2016  . Supervision of normal first pregnancy, antepartum 01/12/2016    Assessment: Holly Greene is a 21 y.o. G1P0 at [redacted]w[redacted]d here for IOL for gHTN.  #Labor:  IOL for gHTN had 2nd elevated pressure today, 1st yesterday in MAU. Cervical ripening with pitocin. #Pain: Epidural upon maternal request and >3cm #FWB: Category 1 tracing #ID:  GBS Positive, tx with pcn #MOF: breast #MOC:?IUD #Circ:  n/a, female #gHTN: Bp 153/102, monitor for s/sx of preE, preE labs negative. Monitor BPs, not in severe range, no antihypertensives indicated at this point.  Durenda Hurt, MD 07/26/2016, 5:15 PM   OB FELLOW HISTORY AND PHYSICAL ATTESTATION  I confirm that I have verified the information documented in the resident's note and that I have also personally reperformed the physical exam and all medical decision making activities.      Ernestina Penna 07/26/2016, 7:54 PM

## 2016-07-26 NOTE — Progress Notes (Signed)
Patient was seen at MAU for contractions-  Her BP was elevated. She was told to come to the office today for recheck. BP discussed with Felisa Bonier, CNM after discussion with Dr Vertis KelchFelisa Bonier ,CNM advised patient go to MAU.

## 2016-07-26 NOTE — MAU Note (Signed)
Pt here for induction for elevated  Blood presuures

## 2016-07-27 ENCOUNTER — Inpatient Hospital Stay (HOSPITAL_COMMUNITY): Payer: Medicaid Other | Admitting: Anesthesiology

## 2016-07-27 LAB — CBC
HCT: 32.8 % — ABNORMAL LOW (ref 36.0–46.0)
Hemoglobin: 11.3 g/dL — ABNORMAL LOW (ref 12.0–15.0)
MCH: 31.5 pg (ref 26.0–34.0)
MCHC: 34.5 g/dL (ref 30.0–36.0)
MCV: 91.4 fL (ref 78.0–100.0)
PLATELETS: 214 10*3/uL (ref 150–400)
RBC: 3.59 MIL/uL — ABNORMAL LOW (ref 3.87–5.11)
RDW: 13.1 % (ref 11.5–15.5)
WBC: 13.4 10*3/uL — AB (ref 4.0–10.5)

## 2016-07-27 LAB — RPR: RPR Ser Ql: NONREACTIVE

## 2016-07-27 MED ORDER — LACTATED RINGERS IV SOLN: 500.0000 mL | Freq: Once | INTRAVENOUS | Status: DC

## 2016-07-27 MED ORDER — LIDOCAINE HCL (PF) 1 % IJ SOLN
INTRAMUSCULAR | Status: DC | PRN
  Administered 2016-07-27: 6 mL via EPIDURAL
  Administered 2016-07-27: 4 mL

## 2016-07-27 MED ORDER — EPHEDRINE 5 MG/ML INJ
10.0000 mg | INTRAVENOUS | Status: DC | PRN
  Filled 2016-07-27: qty 2

## 2016-07-27 MED ORDER — PHENYLEPHRINE 40 MCG/ML (10ML) SYRINGE FOR IV PUSH (FOR BLOOD PRESSURE SUPPORT)
80.0000 ug | PREFILLED_SYRINGE | INTRAVENOUS | Status: DC | PRN
  Filled 2016-07-27: qty 10
  Filled 2016-07-27: qty 5

## 2016-07-27 MED ORDER — TERBUTALINE SULFATE 1 MG/ML IJ SOLN
0.2500 mg | Freq: Once | INTRAMUSCULAR | Status: DC | PRN
  Filled 2016-07-27: qty 1

## 2016-07-27 MED ORDER — OXYTOCIN 40 UNITS IN LACTATED RINGERS INFUSION - SIMPLE MED
1.0000 m[IU]/min | INTRAVENOUS | Status: DC
  Administered 2016-07-27: 2 m[IU]/min via INTRAVENOUS
  Administered 2016-07-27: 12 m[IU]/min via INTRAVENOUS
  Filled 2016-07-27: qty 1000

## 2016-07-27 MED ORDER — FENTANYL 2.5 MCG/ML BUPIVACAINE 1/10 % EPIDURAL INFUSION (WH - ANES)
14.0000 mL/h | INTRAMUSCULAR | Status: DC | PRN
  Administered 2016-07-27 (×2): 14 mL/h via EPIDURAL
  Filled 2016-07-27 (×2): qty 100

## 2016-07-27 MED ORDER — DIPHENHYDRAMINE HCL 50 MG/ML IJ SOLN: 12.5000 mg | INTRAMUSCULAR | Status: DC | PRN

## 2016-07-27 NOTE — Progress Notes (Signed)
Vitals:   07/26/16 2145 07/27/16 0125  BP: (!) 151/103 137/79  Pulse: 84 81  Resp:    Temp:  98.8 F (37.1 C)   FHR Category 1 Contractions rare Patient sleeping.

## 2016-07-27 NOTE — Progress Notes (Signed)
Patient ID: Holly Greene, female   DOB: 12/22/1995, 21 y.o.   MRN: [id number]  Feeling a lot of vag/rectal pressure  FHR 150s, +accels, occ mi variables Ctx q 2-3 mins Cx C/C/+1  IUP@term  End 1st stage  Will begin pushing w/ ctx Anticipate SVD  Cam Hai CNM 07/27/2016 10:42 PM

## 2016-07-27 NOTE — Progress Notes (Signed)
Vitals:   07/27/16 0125 07/27/16 0425  BP: 137/79 (!) 138/92  Pulse: 81 87  Resp:    Temp: 98.8 F (37.1 C) 98.6 F (37 C)   FHR Category I Contractions mild q1-5 minutes Cervix 5/50-60/-2 Foley bulb fell out at 0627. Patient comfortable. Will start pitocin 2x2.

## 2016-07-27 NOTE — Anesthesia Pain Management Evaluation Note (Signed)
  CRNA Pain Management Visit Note  Patient: Holly Greene, 21 y.o., female  "Hello I am a member of the anesthesia team at [facility]. We have an anesthesia team available at all times to provide care throughout the hospital, including epidural management and anesthesia for C-section. I don't know your plan for the delivery whether it a natural birth, water birth, IV sedation, nitrous supplementation, doula or epidural, but we want to meet your pain goals."   1.Was your pain managed to your expectations on prior hospitalizations?   No prior hospitalizations  2.What is your expectation for pain management during this hospitalization?     Epidural  3.How can we help you reach that goal? Epidural when desired. Patient wants to eat first  Record the patient's initial score and the patient's pain goal.   Pain: 4  Pain Goal: 4 [facility] wants you to be able to say your pain was always managed very well.  Quintin Hjort 07/27/2016

## 2016-07-27 NOTE — Progress Notes (Signed)
Patient ID: Holly Greene, female   DOB: [birth year], 21 y.o.   MRN: [id number]  S: Patient seen & examined for progress of labor. Patient comfortable with epidural.    O:  Vitals:   07/27/16 1730 07/27/16 1800 07/27/16 1823 07/27/16 1831  BP:  134/77 (!) 146/95 (!) 137/101  Pulse:  88 88 97  Resp: Temp:      TempSrc:      SpO2:      Weight:      Height:        Dilation: 7 Effacement (%): 80 Cervical Position: Middle Station: -1 Presentation: Vertex Exam by:: Mumaw md  IUPC placed with ease   FHT: 155 bpm, mod var, +accels, no decels IUPC: 190 MVU, q2-52min   A/P: IUPC placed Continue increasing pitocin Continue expectant management Anticipate SVD

## 2016-07-27 NOTE — Progress Notes (Signed)
Patient ID: Taylorann Tkach, female   DOB: [birth year], 21 y.o.   MRN: [id number]  S: Patient seen & examined for progress of labor. Patient comfortable, just finished showering and eating.    O:  Vitals:   07/27/16 0720 07/27/16 0842 07/27/16 0925 07/27/16 0933  BP: 137/81 (!) 140/91 (!) 153/93 (!) 148/92  Pulse: 82 (!) 107 91 (!) 101  Resp: Temp: 97.8 F (36.6 C)     TempSrc: Oral     Weight:      Height:        Dilation: 4.5 Effacement (%): 50 Station: -2 Presentation: Vertex Exam by:: Gifford Shave RN    FHT: 145bpm, mod var, +accels, no decels TOCO: q23min   A/P: Starting pitocin Continue expectant management Anticipate SVD

## 2016-07-27 NOTE — Anesthesia Preprocedure Evaluation (Addendum)
Anesthesia Evaluation  Patient identified by MRN, date of birth, ID band Patient awake    Reviewed: Allergy & Precautions, H&P , Patient's Chart, lab work & pertinent test results, reviewed documented beta blocker date and time   Airway Mallampati: II  TM Distance: >3 FB Neck ROM: full    Dental no notable dental hx.    Pulmonary asthma ,    Pulmonary exam normal breath sounds clear to auscultation       Cardiovascular  Rhythm:regular Rate:Normal     Neuro/Psych    GI/Hepatic   Endo/Other    Renal/GU      Musculoskeletal   Abdominal   Peds  Hematology   Anesthesia Other Findings   Reproductive/Obstetrics                             Anesthesia Physical Anesthesia Plan  ASA: II  Anesthesia Plan: Epidural   Post-op Pain Management:    Induction:   Airway Management Planned: Natural Airway  Additional Equipment:   Intra-op Plan:   Post-operative Plan:   Informed Consent: I have reviewed the patients History and Physical, chart, labs and discussed the procedure including the risks, benefits and alternatives for the proposed anesthesia with the patient or authorized representative who has indicated his/her understanding and acceptance.   Dental Advisory Given  Plan Discussed with: CRNA and Surgeon  Anesthesia Plan Comments:        Anesthesia Quick Evaluation

## 2016-07-27 NOTE — Anesthesia Procedure Notes (Signed)
Epidural Patient location during procedure: OB  Staffing Anesthesiologist: Adamarie Izzo  Preanesthetic Checklist Completed: patient identified, site marked, surgical consent, pre-op evaluation, timeout performed, IV checked, risks and benefits discussed and monitors and equipment checked  Epidural Patient position: sitting Prep: site prepped and draped and DuraPrep Patient monitoring: continuous pulse ox and blood pressure Approach: midline Location: L3-L4 Injection technique: LOR air  Needle:  Needle type: Tuohy  Needle gauge: 17 G Needle length: 9 cm and 9 Needle insertion depth: 6 cm Catheter type: closed end flexible Catheter size: 19 Gauge Catheter at skin depth: 12 cm Test dose: negative  Assessment Events: blood not aspirated, injection not painful, no injection resistance, negative IV test and no paresthesia  Additional Notes Dosing of Epidural:  1st dose, through catheter .............................................  Xylocaine 40 mg  2nd dose, through catheter, after waiting 3 minutes.........Xylocaine 60 mg    As each dose occurred, patient was free of IV sx; and patient exhibited no evidence of SA injection.  Patient is more comfortable after epidural dosed. Please see RN's note for documentation of vital signs,and FHR which are stable.  Patient reminded not to try to ambulate with numb legs, and that an RN must be present when she attempts to get up.        

## 2016-07-27 NOTE — Progress Notes (Signed)
Vitals:   07/26/16 2014 07/26/16 2145  BP: (!) 142/86 (!) 151/103  Pulse: 87 84  Resp:    Temp: 98.4 F (36.9 C)    FHR Category I Contractions rare Foley inserted at 2200 and inflated with 60cc H2O Will start pitocin when foley falls out.

## 2016-07-27 NOTE — Progress Notes (Signed)
Holly Greene is a 21 y.o. G1P0 at [redacted]w[redacted]d by ultrasound admitted for induction of labor due to Hypertension.  Subjective:   Objective: BP 140/81 (BP Location: Left Arm)   Pulse (!) 101   Temp 99.9 F (37.7 C) (Axillary)   Resp 18   Ht  (1.676 m)   Wt 227 lb (103 kg)   LMP 10/31/2015 (Exact Date)   SpO2 99%   BMI 36.64 kg/m  I/O last 3 completed shifts: In: -  Out: 900 [Urine:900] No intake/output data recorded.  FHT:  FHR: 150 bpm, variability: moderate,  accelerations:  Present,  decelerations:  Absent UC:   regular, every 2-4 minutes SVE:   Dilation: 7 Effacement (%): 90 Station: +1 Exam by:: Quincy Carnes CNM Student  Labs: Lab Results  Component Value Date   WBC 13.4 (H) 07/27/2016   HGB 11.3 (L) 07/27/2016   HCT 32.8 (L) 07/27/2016   MCV 91.4 07/27/2016   PLT 214 07/27/2016    Assessment / Plan: IUP at term. Gestational hypertension. Protracted active phase.  Plan: Guardedly optimistic for vaginal delivery due to no cervical change despite adequate MVU's. Will recheck at approximately 2230. Dr. Shawnie Pons updated.     Cleone Slim 07/27/2016, 9:12 PM   I confirm that I have verified the information documented in the student nurse midwife's note and that I have also personally reperformed the physical exam and all medical decision making activities.  Cam Hai 07/27/2016 10:43 PM

## 2016-07-27 NOTE — Progress Notes (Signed)
Patient ID: Holly Greene, female   DOB: 07/13/1995, 21 y.o.   MRN: [id number]  S: Patient seen & examined for progress of labor. Patient comfortable with epidural.    O:  Vitals:   07/27/16 1430 07/27/16 1500 07/27/16 1530 07/27/16 1600  BP: 138/73 133/72 120/78 (!) 144/98  Pulse: 81 79 84   Resp: Temp:    98.8 F (37.1 C)  TempSrc:    Oral  SpO2:      Weight:      Height:        Dilation: 7 Effacement (%): 70 Cervical Position: Middle Station: -2 Presentation: Vertex Exam by:: mumaw rn  AROM performed, light mec fluid returned.   FHT: 145 bpm, mod var, +accels, no decels TOCO: q58min   A/P: AROM performed Continue pitocin Continue expectant management Anticipate SVD

## 2016-07-27 NOTE — Progress Notes (Signed)
Patient ID: Holly Greene, female   DOB: [birth year], 21 y.o.   MRN: [id number]  S: Patient seen & examined for progress of labor. Patient comfortable with epidural.    O:  Vitals:   07/27/16 1301 07/27/16 1331 07/27/16 1400 07/27/16 1430  BP: (!) 146/88 (!) 149/89 (!) 153/103 138/73  Pulse: 83 83 96 81  Resp: Temp:  98 F (36.7 C)    TempSrc:  Oral    SpO2:      Weight:      Height:        Dilation: 6 Effacement (%): 70, 60 Station: -2 Presentation: Vertex Exam by:: Gifford Shave RN    FHT: 145bpm, mod var, +accels, no decels TOCO: q2-10min   A/P: Pitocin at 10 milliunits, continue increasing Continue expectant management Anticipate SVD

## 2016-07-28 ENCOUNTER — Encounter (HOSPITAL_COMMUNITY): Payer: Self-pay

## 2016-07-28 DIAGNOSIS — Z3A38 38 weeks gestation of pregnancy: Secondary | ICD-10-CM

## 2016-07-28 DIAGNOSIS — O134 Gestational [pregnancy-induced] hypertension without significant proteinuria, complicating childbirth: Secondary | ICD-10-CM

## 2016-07-28 DIAGNOSIS — O99824 Streptococcus B carrier state complicating childbirth: Secondary | ICD-10-CM

## 2016-07-28 LAB — CBC
HCT: 33.7 % — ABNORMAL LOW (ref 36.0–46.0)
HEMOGLOBIN: 11.7 g/dL — AB (ref 12.0–15.0)
MCH: 31.6 pg (ref 26.0–34.0)
MCHC: 34.7 g/dL (ref 30.0–36.0)
MCV: 91.1 fL (ref 78.0–100.0)
Platelets: 224 10*3/uL (ref 150–400)
RBC: 3.7 MIL/uL — ABNORMAL LOW (ref 3.87–5.11)
RDW: 13.2 % (ref 11.5–15.5)
WBC: 17.7 10*3/uL — ABNORMAL HIGH (ref 4.0–10.5)

## 2016-07-28 LAB — URINE CULTURE

## 2016-07-28 MED ORDER — SENNOSIDES-DOCUSATE SODIUM 8.6-50 MG PO TABS
2.0000 | ORAL_TABLET | ORAL | Status: DC
  Filled 2016-07-28: qty 2

## 2016-07-28 MED ORDER — PRENATAL MULTIVITAMIN CH
1.0000 | ORAL_TABLET | Freq: Every day | ORAL | Status: DC
  Administered 2016-07-28: 1 via ORAL
  Filled 2016-07-28: qty 1

## 2016-07-28 MED ORDER — ONDANSETRON HCL 4 MG/2ML IJ SOLN
4.0000 mg | INTRAMUSCULAR | Status: DC | PRN
Start: 2016-07-28 — End: 2016-07-29

## 2016-07-28 MED ORDER — IBUPROFEN 600 MG PO TABS: 600.0000 mg | ORAL_TABLET | Freq: Four times a day (QID) | ORAL | 0 refills | Status: DC

## 2016-07-28 MED ORDER — IBUPROFEN 600 MG PO TABS
600.0000 mg | ORAL_TABLET | Freq: Four times a day (QID) | ORAL | Status: DC
  Administered 2016-07-28 – 2016-07-29 (×6): 600 mg via ORAL
  Filled 2016-07-28 (×6): qty 1

## 2016-07-28 MED ORDER — ACETAMINOPHEN 325 MG PO TABS: 650.0000 mg | ORAL_TABLET | ORAL | Status: DC | PRN

## 2016-07-28 MED ORDER — SIMETHICONE 80 MG PO CHEW: 80.0000 mg | CHEWABLE_TABLET | ORAL | Status: DC | PRN

## 2016-07-28 MED ORDER — ZOLPIDEM TARTRATE 5 MG PO TABS: 5.0000 mg | ORAL_TABLET | Freq: Every evening | ORAL | Status: DC | PRN

## 2016-07-28 MED ORDER — COCONUT OIL OIL: 1.0000 "application " | TOPICAL_OIL | Status: DC | PRN

## 2016-07-28 MED ORDER — ONDANSETRON HCL 4 MG PO TABS: 4.0000 mg | ORAL_TABLET | ORAL | Status: DC | PRN

## 2016-07-28 MED ORDER — WITCH HAZEL-GLYCERIN EX PADS: 1.0000 "application " | MEDICATED_PAD | CUTANEOUS | Status: DC | PRN

## 2016-07-28 MED ORDER — DIPHENHYDRAMINE HCL 25 MG PO CAPS: 25.0000 mg | ORAL_CAPSULE | Freq: Four times a day (QID) | ORAL | Status: DC | PRN

## 2016-07-28 MED ORDER — OXYCODONE HCL 5 MG PO TABS
5.0000 mg | ORAL_TABLET | ORAL | Status: DC | PRN
Start: 2016-07-28 — End: 2016-07-29

## 2016-07-28 MED ORDER — MEASLES, MUMPS & RUBELLA VAC ~~LOC~~ INJ
0.5000 mL | INJECTION | Freq: Once | SUBCUTANEOUS | Status: DC
  Filled 2016-07-28: qty 0.5

## 2016-07-28 MED ORDER — AMLODIPINE BESYLATE 5 MG PO TABS: 5.0000 mg | ORAL_TABLET | Freq: Every day | ORAL | 0 refills | Status: DC

## 2016-07-28 MED ORDER — DIBUCAINE 1 % RE OINT: 1.0000 "application " | TOPICAL_OINTMENT | RECTAL | Status: DC | PRN

## 2016-07-28 MED ORDER — TRIAMTERENE-HCTZ 37.5-25 MG PO TABS
1.0000 | ORAL_TABLET | Freq: Every day | ORAL | Status: DC
  Filled 2016-07-28: qty 1

## 2016-07-28 MED ORDER — AMLODIPINE BESYLATE 5 MG PO TABS
5.0000 mg | ORAL_TABLET | Freq: Every day | ORAL | Status: DC
  Administered 2016-07-28 – 2016-07-29 (×2): 5 mg via ORAL
  Filled 2016-07-28 (×2): qty 1

## 2016-07-28 MED ORDER — TETANUS-DIPHTH-ACELL PERTUSSIS 5-2.5-18.5 LF-MCG/0.5 IM SUSP
0.5000 mL | Freq: Once | INTRAMUSCULAR | Status: AC
  Administered 2016-07-29: 0.5 mL via INTRAMUSCULAR
  Filled 2016-07-28: qty 0.5

## 2016-07-28 MED ORDER — DOCUSATE SODIUM 100 MG PO CAPS: 100.0000 mg | ORAL_CAPSULE | Freq: Two times a day (BID) | ORAL | 0 refills | Status: DC

## 2016-07-28 MED ORDER — METHYLERGONOVINE MALEATE 0.2 MG PO TABS: 0.2000 mg | ORAL_TABLET | ORAL | Status: DC | PRN

## 2016-07-28 MED ORDER — OXYCODONE HCL 5 MG PO TABS: 10.0000 mg | ORAL_TABLET | ORAL | Status: DC | PRN

## 2016-07-28 MED ORDER — BENZOCAINE-MENTHOL 20-0.5 % EX AERO
1.0000 "application " | INHALATION_SPRAY | CUTANEOUS | Status: DC | PRN
  Administered 2016-07-28: 1 via TOPICAL
  Filled 2016-07-28: qty 56

## 2016-07-28 MED ORDER — METHYLERGONOVINE MALEATE 0.2 MG/ML IJ SOLN: 0.2000 mg | INTRAMUSCULAR | Status: DC | PRN

## 2016-07-28 NOTE — Discharge Instructions (Signed)

## 2016-07-28 NOTE — Lactation Note (Signed)
This note was copied from a baby's chart. Lactation Consultation Note  Patient Name: Girl Yina Riviere [id number] Date: 07/28/2016 Reason for consult: Initial assessment Breastfeeding consultation services and support information given and reviewed.  This is mom's first baby and newborn is 19 hours old.  Baby has had two feedings a breast and a few attempts.  Baby has been sleepy this AM.  RN started a nipple shield.  Instructed to feed with feeding cues and to call for assist/concerns prn.  Maternal Data Has patient been taught Hand Expression?: Yes Does the patient have breastfeeding experience prior to this delivery?: No  Feeding Feeding Type: Breast Fed Length of feed: 0 min (attempt per mom)  LATCH Score/Interventions                      Lactation Tools Discussed/Used Tools: Nipple Shields Nipple shield size: 24   Consult Status Consult Status: Follow-up Date: 07/29/16 Follow-up type: In-patient    [name] 07/28/2016, 1:33 PM

## 2016-07-28 NOTE — Anesthesia Postprocedure Evaluation (Signed)
Anesthesia Post Note  Patient: Holly Greene  Procedure(s) Performed: * No procedures listed *  Patient location during evaluation: Mother Baby Anesthesia Type: Epidural Level of consciousness: awake and alert Pain management: pain level controlled Vital Signs Assessment: post-procedure vital signs reviewed and stable Respiratory status: spontaneous breathing, nonlabored ventilation and respiratory function stable Cardiovascular status: stable Postop Assessment: no headache, no backache and epidural receding Anesthetic complications: no        Last Vitals:  Vitals:   07/28/16 0342 07/28/16 0750  BP: (!) 145/79 (!) 148/86  Pulse: 87 69  Resp: 20 18  Temp: 36.9 C 36.6 C    Last Pain:  Vitals:   07/28/16 0750  TempSrc: Oral  PainSc: 0-No pain   Pain Goal: Patients Stated Pain Goal: 4 (07/27/16 2000)               Mauricia Area

## 2016-07-28 NOTE — Clinical Social Work Maternal (Signed)
  CLINICAL SOCIAL WORK MATERNAL/CHILD NOTE  Patient Details  Name: Holly Greene MRN: [id number] Date of Birth: 20-Jan-1996  Date:  07/28/2016  Clinical Social Worker Initiating Note:  Laurey Arrow Date/ Time Initiated:  07/28/16/0900     Child's Name:  Holly Greene   Legal Guardian:  Mother ([name])   Need for Interpreter:  None   Date of Referral:  07/28/16     Reason for Referral:  Behavioral Health Issues, including SI  (hx of bipolar disorder)   Referral Source:  [employer]   Address:  [place] [address]  Phone number:  [phone]   Household Members:  Self, Significant Other   Natural Supports (not living in the home):  (S) Immediate Family, Extended Family, Parent (FOB family will also provide support for [name] and family. )   Professional Supports: Case Metallurgist (CSW has an OB Tourist information centre manager.)   Employment: Part-time   Type of Work: Scientist, water quality at [employer].   Education:  9 to 11 years   Financial Resources:  Medicaid   Other Resources:  [employer], Physicist, medical    Cultural/Religious Considerations Which May Impact Care:  None Reported  Strengths:  Ability to meet basic needs , Engineer, materials , Home prepared for child , Understanding of illness   Risk Factors/Current Problems:  Mental Health Concerns    Cognitive State:  Alert , Able to Concentrate , Linear Thinking , Insightful    Mood/Affect:  Bright , Comfortable , Relaxed , Happy    CSW Assessment: CSW met with [name] to complete an assessment for hx of bipolar disorder. When CSW arrived, [name] was in bed resting, infant was asleep in bassinet, and FOB was asleep on couch.  [name] gave CSW permission to complete assessment while FOB was in the room. CSW inquired about [name]'s MH hx and [name] acknowledged a dx of bipolar disorder.  [name] reported [name] was dx in 5th grade and was a medication regiment until 10th grade.  [name] communicated [name]'s  symptoms subsided and [name] discontinued medications. [name] denies having in signs in symptoms in over 5 years.  CSW educated [name] about PPD.  CSW also encouraged [name] to seek medical attention if needed for increased signs and symptoms for PPD. CSW offered [name] resources for outpatient counseling, and [name] declined. [name] expressed that [name] feels comfortable asking for help and will reach out to [name]'s OBGYN if help if warranted. CSW provided [name] with a PMD flyer and encouraged [name] to review it weekly; [name] agreed. CSW provided [name] with CSW contact information and encouraged [name] to reach out to CSW if [name] had any questions or concerns.  CSW Plan/Description:  Information/Referral to [employer] , [employer] , No Further Intervention Required/No Barriers to Discharge   Laurey Arrow, MSW, LCSW Clinical Social Work [phone]  Dimple Nanas, LCSW 07/28/2016, 9:08 AM

## 2016-07-28 NOTE — Progress Notes (Signed)
Post Partum Day #1 Subjective: no complaints, up ad lib and tolerating PO; breastfeeding going well; considering IUD for PP contraception  Objective: BPs: 155/95, 149/91 Blood pressure (!) 145/79, pulse 87, temperature 98.4 F (36.9 C), temperature source Axillary, resp. rate 20, height  (1.676 m), weight 103 kg (227 lb), last menstrual period 10/31/2015, SpO2 98 %, unknown if currently breastfeeding.  Physical Exam:  General: alert, cooperative and no distress Lochia: appropriate Uterine Fundus: firm DVT Evaluation: No evidence of DVT seen on physical exam.   Recent Labs  07/27/16 0735 07/28/16 0203  HGB 11.3* 11.7*  HCT 32.8* 33.7*    Assessment/Plan: Plan for discharge tomorrow  Started on Norvasc  today.   LOS: 2 days   Cam Hai CNM 07/28/2016, 8:17 AM

## 2016-07-28 NOTE — Progress Notes (Signed)
Notified Dr. Omer Jack of pts BPs. Dr. Omer Jack to change parameter orders.

## 2016-07-29 NOTE — Lactation Note (Signed)
This note was copied from a baby's chart. Lactation Consultation Note  Patient Name: Holly Greene [id number] Date: 07/29/2016  Mom states baby is latching without nipple shield.  Mom started giving formula this AM as recommended by pediatrician.  She wasn't sure why he recommended formula.  Encouraged to continue breastfeeding first and when breasts feel full wean down formula.  Manual pump given with instructions.  Mom denies questions/concerns.  Outpatient lactation services and support information reviewed and encouraged prn.   Maternal Data    Feeding Feeding Type: Breast Fed Length of feed: 15 min  LATCH Score/Interventions                      Lactation Tools Discussed/Used     Consult Status      [name] 07/29/2016, 9:44 AM

## 2016-07-29 NOTE — Discharge Summary (Signed)
OB Discharge Summary     Patient Name: Dellamae Rosamilia DOB: 1995-09-14 MRN: [id number]  Date of admission: 07/26/2016 Delivering MD: Cam Hai D   Date of discharge: 07/29/2016  Admitting diagnosis: 38w hbp Intrauterine pregnancy: [redacted]w[redacted]d     Secondary diagnosis:  Principal Problem:   NSVD (normal spontaneous vaginal delivery) Active Problems:   Normal labor   Gestational hypertension  Additional problems: None     Discharge diagnosis: Term Pregnancy Delivered and Gestational Hypertension                                                                                                Post partum procedures:None  Augmentation: AROM, Pitocin, Cytotec and Foley Balloon  Complications: None  Hospital course:  Induction of Labor With Vaginal Delivery   21 y.o. yo G1P1001 at [redacted]w[redacted]d was admitted to the hospital 07/26/2016 for induction of labor.  Indication for induction: Gestational hypertension.  Patient had an uncomplicated labor course as follows: Membrane Rupture Time/Date: 4:06 PM ,07/27/2016   Intrapartum Procedures: Episiotomy: None [1]                                         Lacerations:  None [1]  Patient had delivery of a Viable infant.  Information for the patient's newborn:  Sachiko, Methot Girl Hyla [[id number]]  Delivery Method: Vag-Spont   07/27/2016  Details of delivery can be found in separate delivery note.  Patient had a routine postpartum course. Patient is discharged home 07/29/16.  Physical exam  Vitals:   07/28/16 1705 07/28/16 1841 07/28/16 2300 07/29/16 0500  BP: (!) 142/83 (!) 146/89 137/74 136/83  Pulse: 85 89 83 70  Resp: 20   (!) 1  Temp: 98.2 F (36.8 C)   97.9 F (36.6 C)  TempSrc: Oral     SpO2:      Weight:      Height:       General: alert, cooperative and no distress Lochia: appropriate Uterine Fundus: firm Incision: N/A DVT Evaluation: No evidence of DVT seen on physical exam. Negative Homan's sign. No cords or calf  tenderness. Labs: Lab Results  Component Value Date   WBC 17.7 (H) 07/28/2016   HGB 11.7 (L) 07/28/2016   HCT 33.7 (L) 07/28/2016   MCV 91.1 07/28/2016   PLT 224 07/28/2016   CMP Latest Ref Rng & Units 07/26/2016  Glucose 65 - 99 mg/dL 94  BUN 6 - 20 mg/dL 5(L)  Creatinine 7.82 - 1.00 mg/dL 9.56  Sodium 213 - 086 mmol/L 136  Potassium 3.5 - 5.1 mmol/L 3.3(L)  Chloride 101 - 111 mmol/L 105  CO2 22 - 32 mmol/L 23  Calcium 8.9 - 10.3 mg/dL 9.0  Total Protein 6.5 - 8.1 g/dL 6.0(L)  Total Bilirubin 0.3 - 1.2 mg/dL 0.6  Alkaline Phos 38 - 126 U/L 113  AST 15 - 41 U/L 47(H)  ALT 14 - 54 U/L 13(L)    Discharge instruction: per After Visit Summary and "Baby and Me Booklet".  After visit meds:  Allergies as of 07/29/2016   No Known Allergies     Medication List    TAKE these medications   albuterol 108 (90 Base) MCG/ACT inhaler Commonly known as:  PROVENTIL HFA;VENTOLIN HFA Inhale 2 puffs into the lungs every 6 (six) hours as needed for wheezing or shortness of breath.   amLODipine 5 MG tablet Commonly known as:  NORVASC Take 1 tablet (5 mg total) by mouth daily.   docusate sodium 100 MG capsule Commonly known as:  COLACE Take 1 capsule (100 mg total) by mouth 2 (two) times daily.   ibuprofen 600 MG tablet Commonly known as:  ADVIL,MOTRIN Take 1 tablet (600 mg total) by mouth every 6 (six) hours.   PRENATE PIXIE 10-0.6-0.4-200 MG Caps Take 1 tablet by mouth daily.       Diet: routine diet  Activity: Advance as tolerated. Pelvic rest for 6 weeks.   Outpatient follow up:1 week for BP check; 6 weeks for postpartum care Follow up Appt:Future Appointments Date Time Provider Department Center  08/27/2016 10:00 AM Roe Coombs, CNM [facility] None   Follow up Visit:No Follow-up on file.  Postpartum contraception: IUD Mirena  Newborn Data: Live born female  Birth Weight: 7 lb 9.4 oz (3442 g) APGAR: 5, 8  Baby Feeding: Breast Disposition:home with  mother   07/29/2016 Jen Mow, DO OB Fellow

## 2016-07-30 ENCOUNTER — Other Ambulatory Visit: Payer: Self-pay | Admitting: Certified Nurse Midwife

## 2016-08-01 ENCOUNTER — Encounter: Payer: Self-pay | Admitting: Certified Nurse Midwife

## 2016-08-01 ENCOUNTER — Other Ambulatory Visit: Payer: Self-pay | Admitting: Certified Nurse Midwife

## 2016-08-18 ENCOUNTER — Encounter (HOSPITAL_COMMUNITY): Payer: Self-pay | Admitting: *Deleted

## 2016-08-18 ENCOUNTER — Emergency Department (HOSPITAL_COMMUNITY)
Admission: EM | Admit: 2016-08-18 | Discharge: 2016-08-18 | Disposition: A | Discharge status: Home / Self Care | Payer: Medicaid Other | Attending: Emergency Medicine | Admitting: Emergency Medicine

## 2016-08-18 ENCOUNTER — Emergency Department (HOSPITAL_COMMUNITY): Payer: Medicaid Other

## 2016-08-18 DIAGNOSIS — M79605 Pain in left leg: Secondary | ICD-10-CM | POA: Diagnosis not present

## 2016-08-18 DIAGNOSIS — Z79899 Other long term (current) drug therapy: Secondary | ICD-10-CM | POA: Diagnosis not present

## 2016-08-18 DIAGNOSIS — M791 Myalgia: Secondary | ICD-10-CM | POA: Insufficient documentation

## 2016-08-18 DIAGNOSIS — F1290 Cannabis use, unspecified, uncomplicated: Secondary | ICD-10-CM | POA: Diagnosis not present

## 2016-08-18 DIAGNOSIS — J45909 Unspecified asthma, uncomplicated: Secondary | ICD-10-CM | POA: Insufficient documentation

## 2016-08-18 DIAGNOSIS — M7918 Myalgia, other site: Secondary | ICD-10-CM

## 2016-08-18 MED ORDER — IBUPROFEN 600 MG PO TABS: 600.0000 mg | ORAL_TABLET | Freq: Four times a day (QID) | ORAL | 0 refills | Status: DC | PRN

## 2016-08-18 MED ORDER — IBUPROFEN 200 MG PO TABS
600.0000 mg | ORAL_TABLET | Freq: Once | ORAL | Status: AC
  Administered 2016-08-18: 600 mg via ORAL
  Filled 2016-08-18: qty 3

## 2016-08-18 NOTE — ED Provider Notes (Signed)
[facility]-EMERGENCY DEPT Provider Note   CSN: [id number] Arrival date & time: 08/18/16  1226     History   Chief Complaint Chief Complaint  Patient presents with  . Leg Pain    HPI Holly Greene is a 21 y.o. female.  HPI  21 y.o. female with a hx of Asthma, Bipolar Disorder, presents to the Emergency Department today due to mechanical fall in drive way after running. Pt states that she felt her knee pop while ambulating and proceeded to fall. No head trauma or LOC. Notes pain in left ankle as well as knee. Worse with ambulation. Worse with ROM. Pain noted at rest above knee. Notes numbness to left knee. No obvious deformities noted per PTAR. No meds PTA. No other symptoms noted.   Past Medical History:  Diagnosis Date  . Asthma    as a child  . Bipolar 1 disorder (HCC)   . Defiant behavior   . Eczema     Patient Active Problem List   Diagnosis Date Noted  . NSVD (normal spontaneous vaginal delivery) 07/28/2016  . Normal labor 07/26/2016  . Gestational hypertension 07/26/2016  . GBS (group B Streptococcus carrier), +RV culture, currently pregnant 07/25/2016  . UTI (urinary tract infection) during pregnancy 06/09/2016  . Supervision of normal first pregnancy, antepartum 01/12/2016    Past Surgical History:  Procedure Laterality Date  . TONSILLECTOMY      OB History    Gravida Para Term Preterm AB Living   1 1 1     1    SAB TAB Ectopic Multiple Live Births         0 1       Home Medications    Prior to Admission medications   Medication Sig Start Date End Date Taking? Authorizing Provider  albuterol (PROVENTIL HFA;VENTOLIN HFA) 108 (90 Base) MCG/ACT inhaler Inhale 2 puffs into the lungs every 6 (six) hours as needed for wheezing or shortness of breath. 12/29/15   Aviva Signs, CNM  amLODipine (NORVASC) 5 MG tablet Take 1 tablet (5 mg total) by mouth daily. 07/29/16   Mumaw, Hiram Comber, DO  docusate sodium (COLACE) 100 MG capsule Take 1 capsule (100 mg  total) by mouth 2 (two) times daily. 07/28/16   Mumaw, Hiram Comber, DO  ibuprofen (ADVIL,MOTRIN) 600 MG tablet Take 1 tablet (600 mg total) by mouth every 6 (six) hours. 07/29/16   Mumaw, Hiram Comber, DO  Prenat-FeAsp-Meth-FA-DHA w/o A (PRENATE PIXIE) 10-0.6-0.4-200 MG CAPS Take 1 tablet by mouth daily. 06/26/16   Roe Coombs, CNM    Family History No family history on file.  Social History Social History  Substance Use Topics  . Smoking status: Never Smoker  . Smokeless tobacco: Never Used  . Alcohol use No     Allergies   Patient has no known allergies.   Review of Systems Review of Systems  Cardiovascular: Negative for leg swelling.  Gastrointestinal: Negative for nausea.  Musculoskeletal: Positive for arthralgias.  Skin: Negative for wound.  Neurological: Negative for dizziness and numbness.   Physical Exam Updated Vital Signs BP 132/76 (BP Location: Left Arm)   Pulse 90   Temp 98.5 F (36.9 C) (Oral)   Resp 16   SpO2 99%   Physical Exam  Constitutional: She is oriented to person, place, and time. Vital signs are normal. She appears well-developed and well-nourished.  HENT:  Head: Normocephalic and atraumatic.  Right Ear: Hearing normal.  Left Ear: Hearing normal.  Eyes: Conjunctivae and EOM  are normal. Pupils are equal, round, and reactive to light.  Neck: Normal range of motion. Neck supple.  Cardiovascular: Normal rate and regular rhythm.   Pulmonary/Chest: Effort normal.  Musculoskeletal:  Left Knee: Negative anterior/poster drawer bilaterally. Negative ballottement test. No varus or valgus laxity. No crepitus. Noted pain with flexion and extension of knee. Ankle ROM intact. TTP below lateral malleolus. No obvious swelling or deformities. NVI. Distal pulses appreciated.   Neurological: She is alert and oriented to person, place, and time.  Skin: Skin is warm and dry.  Psychiatric: She has a normal mood and affect. Her speech is normal and  behavior is normal. Thought content normal.  Nursing note and vitals reviewed.  ED Treatments / Results  Labs (all labs ordered are listed, but only abnormal results are displayed) Labs Reviewed - No data to display  EKG  EKG Interpretation None       Radiology Dg Tibia/fibula Left  Result Date: 08/18/2016 CLINICAL DATA:  21 year old female with pain in the left knee and ankle after she fell in the driveway EXAM: LEFT TIBIA AND FIBULA - 2 VIEW COMPARISON:  Concurrently obtained radiographs of the ankle and knee FINDINGS: There is no evidence of fracture or other focal bone lesions. Soft tissues are unremarkable. IMPRESSION: Negative. Electronically Signed   By: Malachy MoanHeath  McCullough M.D.   On: 08/18/2016 13:44   Dg Ankle Complete Left  Result Date: 08/18/2016 CLINICAL DATA:  21 year old female with pain in the left knee and ankle after she fell in the driveway EXAM: LEFT ANKLE COMPLETE - 3+ VIEW COMPARISON:  Concurrently obtained radiographs of the left knee and lower leg FINDINGS: There is no evidence of fracture, dislocation, or joint effusion. There is no evidence of arthropathy or other focal bone abnormality. Soft tissues are unremarkable. IMPRESSION: Negative. Electronically Signed   By: Malachy MoanHeath  McCullough M.D.   On: 08/18/2016 13:44   Dg Knee Complete 4 Views Left  Result Date: 08/18/2016 CLINICAL DATA:  21 year old female with pain in the left knee and ankle after she fell in the driveway EXAM: LEFT KNEE - COMPLETE 4+ VIEW COMPARISON:  Concurrently obtained radiographs of the left lower leg and ankle FINDINGS: No evidence of fracture, dislocation, or joint effusion. No evidence of arthropathy or other focal bone abnormality. Soft tissues are unremarkable. IMPRESSION: Negative. Electronically Signed   By: Malachy MoanHeath  McCullough M.D.   On: 08/18/2016 13:45    Procedures Procedures (including critical care time)  Medications Ordered in ED Medications - No data to display   Initial  Impression / Assessment and Plan / ED Course  I have reviewed the triage vital signs and the nursing notes.  Pertinent labs & imaging results that were available during my care of the patient were reviewed by me and considered in my medical decision making (see chart for details).  Final Clinical Impressions(s) / ED Diagnoses   {I have reviewed and evaluated the relevant imaging studies.  {I have reviewed the relevant previous healthcare records.  {I obtained HPI from historian.   ED Course:  Assessment: Patient X-Ray negative for obvious fracture or dislocation. Likely muscle strain. Pt advised to follow up with PCP. Patient given brace and crutches while in ED, conservative therapy recommended and discussed. Patient will be discharged home & is agreeable with above plan. Returns precautions discussed. Pt appears safe for discharge.  Disposition/Plan:  DC Home Additional Verbal discharge instructions given and discussed with patient.  Pt Instructed to f/u with PCP in the next week  for evaluation and treatment of symptoms. Return precautions given Pt acknowledges and agrees with plan  Supervising Physician Jerelyn Scott, MD  Final diagnoses:  Left leg pain  Musculoskeletal pain    New Prescriptions New Prescriptions   No medications on file     Audry Pili, Cordelia Poche 08/18/16 1349    Jerelyn Scott, MD 08/18/16 1400

## 2016-08-18 NOTE — ED Notes (Signed)
Ortho Tech paged.

## 2016-08-18 NOTE — Discharge Instructions (Signed)
Please read and follow all provided instructions.  Your diagnoses today include:  1. Left leg pain   2. Musculoskeletal pain    Tests performed today include: Vital signs. See below for your results today.   Medications prescribed:  Take as prescribed   Home care instructions:  Follow any educational materials contained in this packet.  Follow-up instructions: Please follow-up with your primary care provider for further evaluation of symptoms and treatment   Return instructions:  Please return to the Emergency Department if you do not get better, if you get worse, or new symptoms OR  - Fever (temperature greater than 101.55F)  - Bleeding that does not stop with holding pressure to the area    -Severe pain (please note that you may be more sore the day after your accident)  - Chest Pain  - Difficulty breathing  - Severe nausea or vomiting  - Inability to tolerate food and liquids  - Passing out  - Skin becoming red around your wounds  - Change in mental status (confusion or lethargy)  - New numbness or weakness    Please return if you have any other emergent concerns.  Additional Information:  Your vital signs today were: BP 132/76 (BP Location: Left Arm)    Pulse 90    Temp 98.5 F (36.9 C) (Oral)    Resp 16    SpO2 99%  If your blood pressure (BP) was elevated above 135/85 this visit, please have this repeated by your doctor within one month. ---------------

## 2016-08-18 NOTE — ED Triage Notes (Signed)
Pt bib PTAR and presents after pt fell in the drive way and felt her left knee "pop".  Pt reports more pain in her ankle but pain is from knee down to ankle.  Pt denies LOC or hitting her head.  Pt recently gave birth. Pt has good pulses.

## 2016-08-27 ENCOUNTER — Ambulatory Visit: Payer: Self-pay | Admitting: Certified Nurse Midwife

## 2016-09-17 ENCOUNTER — Encounter: Payer: Self-pay | Admitting: Certified Nurse Midwife

## 2016-09-17 ENCOUNTER — Ambulatory Visit (INDEPENDENT_AMBULATORY_CARE_PROVIDER_SITE_OTHER): Payer: Medicaid Other | Admitting: Certified Nurse Midwife

## 2016-09-17 VITALS — BP 128/77 | HR 87 | Ht 67.0 in | Wt 190.4 lb

## 2016-09-17 DIAGNOSIS — Z3202 Encounter for pregnancy test, result negative: Secondary | ICD-10-CM

## 2016-09-17 DIAGNOSIS — Z7251 High risk heterosexual behavior: Secondary | ICD-10-CM

## 2016-09-17 LAB — POCT URINE PREGNANCY: PREG TEST UR: NEGATIVE

## 2016-09-17 NOTE — Progress Notes (Signed)
Post Partum Exam  Holly CotaJalasia Greene is a 21 y.o. 471P1001 female who presents for a postpartum visit. She is 7 weeks postpartum following a spontaneous vaginal delivery. I have fully reviewed the prenatal and intrapartum course. The delivery was at 38 gestational weeks.  Anesthesia: epidural. Postpartum course has been normal. Baby's course has been normal; does not have custody of infant. Baby is feeding by bottle - Similac Advance. Bleeding no bleeding. Bowel function is normal. Bladder function is normal. Patient is sexually active. Contraception method is none. Postpartum depression screening:neg  The following portions of the patient's history were reviewed and updated as appropriate: allergies, current medications, past family history, past medical history, past social history, past surgical history and problem list.  Review of Systems Pertinent items noted in HPI and remainder of comprehensive ROS otherwise negative.    Objective:  unknown if currently breastfeeding.  General:  alert, cooperative and no distress   Breasts:  inspection negative, no nipple discharge or bleeding, no masses or nodularity palpable  Lungs: clear to auscultation bilaterally  Heart:  regular rate and rhythm, S1, S2 normal, no murmur, click, rub or gallop  Abdomen: soft, non-tender; bowel sounds normal; no masses,  no organomegaly   Vulva:  normal  Pelvic Exam: Not performed.        Assessment:    Normal 7 week postpartum exam. Pap smear not done at today's visit.   UPT today, high risk sexual behavior  Plan:   1. Contraception: abstinence and condoms encouraged 2.  F/U 2 weeks with UPT and IUD.  3. Follow up in: 2 weeks or as needed.

## 2016-09-18 LAB — HCG, SERUM, QUALITATIVE: hCG,Beta Subunit,Qual,Serum: NEGATIVE m[IU]/mL (ref <6)

## 2016-10-01 ENCOUNTER — Ambulatory Visit: Payer: Self-pay | Admitting: Certified Nurse Midwife

## 2017-02-20 ENCOUNTER — Encounter: Payer: Self-pay | Admitting: Obstetrics

## 2017-02-20 ENCOUNTER — Ambulatory Visit (INDEPENDENT_AMBULATORY_CARE_PROVIDER_SITE_OTHER): Payer: Medicaid Other | Admitting: Obstetrics

## 2017-02-20 ENCOUNTER — Other Ambulatory Visit (HOSPITAL_COMMUNITY)
Admission: RE | Admit: 2017-02-20 | Discharge: 2017-02-20 | Disposition: A | Discharge status: Home / Self Care | Payer: Medicaid Other | Source: Ambulatory Visit | Attending: Obstetrics | Admitting: Obstetrics

## 2017-02-20 VITALS — BP 130/76 | HR 91 | Wt 194.6 lb

## 2017-02-20 DIAGNOSIS — Z3482 Encounter for supervision of other normal pregnancy, second trimester: Secondary | ICD-10-CM | POA: Diagnosis not present

## 2017-02-20 DIAGNOSIS — Z3490 Encounter for supervision of normal pregnancy, unspecified, unspecified trimester: Secondary | ICD-10-CM | POA: Diagnosis not present

## 2017-02-21 ENCOUNTER — Other Ambulatory Visit: Payer: Self-pay | Admitting: Obstetrics

## 2017-02-21 ENCOUNTER — Encounter: Payer: Self-pay | Admitting: Obstetrics

## 2017-02-21 DIAGNOSIS — E559 Vitamin D deficiency, unspecified: Secondary | ICD-10-CM

## 2017-02-21 DIAGNOSIS — N760 Acute vaginitis: Secondary | ICD-10-CM

## 2017-02-21 DIAGNOSIS — B3731 Acute candidiasis of vulva and vagina: Secondary | ICD-10-CM

## 2017-02-21 DIAGNOSIS — B373 Candidiasis of vulva and vagina: Secondary | ICD-10-CM

## 2017-02-21 DIAGNOSIS — B9689 Other specified bacterial agents as the cause of diseases classified elsewhere: Secondary | ICD-10-CM

## 2017-02-21 LAB — OBSTETRIC PANEL, INCLUDING HIV
Antibody Screen: NEGATIVE
Basophils Absolute: 0 10*3/uL (ref 0.0–0.2)
Basos: 0 %
EOS (ABSOLUTE): 0.2 10*3/uL (ref 0.0–0.4)
Eos: 2 %
HIV Screen 4th Generation wRfx: NONREACTIVE
Hematocrit: 33.3 % — ABNORMAL LOW (ref 34.0–46.6)
Hemoglobin: 11.4 g/dL (ref 11.1–15.9)
Hepatitis B Surface Ag: NEGATIVE
IMMATURE GRANS (ABS): 0 10*3/uL (ref 0.0–0.1)
IMMATURE GRANULOCYTES: 0 %
LYMPHS: 22 %
Lymphocytes Absolute: 2 10*3/uL (ref 0.7–3.1)
MCH: 30.9 pg (ref 26.6–33.0)
MCHC: 34.2 g/dL (ref 31.5–35.7)
MCV: 90 fL (ref 79–97)
MONOS ABS: 0.5 10*3/uL (ref 0.1–0.9)
Monocytes: 6 %
NEUTROS PCT: 70 %
Neutrophils Absolute: 6.3 10*3/uL (ref 1.4–7.0)
PLATELETS: 239 10*3/uL (ref 150–379)
RBC: 3.69 x10E6/uL — AB (ref 3.77–5.28)
RDW: 15.1 % (ref 12.3–15.4)
RPR Ser Ql: NONREACTIVE
Rh Factor: POSITIVE
Rubella Antibodies, IGG: 4.69 index (ref >0.99)
WBC: 9 10*3/uL (ref 3.4–10.8)

## 2017-02-21 LAB — CYTOLOGY - PAP: Diagnosis: NEGATIVE

## 2017-02-21 LAB — CERVICOVAGINAL ANCILLARY ONLY
BACTERIAL VAGINITIS: POSITIVE — AB
CANDIDA VAGINITIS: POSITIVE — AB
CHLAMYDIA, DNA PROBE: NEGATIVE
NEISSERIA GONORRHEA: NEGATIVE
Trichomonas: NEGATIVE

## 2017-02-21 LAB — VARICELLA ZOSTER ANTIBODY, IGG: VARICELLA: 795 {index} (ref >165)

## 2017-02-21 LAB — VITAMIN D 25 HYDROXY (VIT D DEFICIENCY, FRACTURES): Vit D, 25-Hydroxy: 11.2 ng/mL — ABNORMAL LOW (ref 30.0–100.0)

## 2017-02-21 MED ORDER — VITAMIN D 50 MCG (2000 UT) PO CAPS: 1.0000 | ORAL_CAPSULE | Freq: Every day | ORAL | 5 refills | Status: DC

## 2017-02-21 MED ORDER — METRONIDAZOLE 500 MG PO TABS: 500.0000 mg | ORAL_TABLET | Freq: Two times a day (BID) | ORAL | 2 refills | Status: DC

## 2017-02-21 MED ORDER — TERCONAZOLE 0.8 % VA CREA: 1.0000 | TOPICAL_CREAM | Freq: Every day | VAGINAL | 0 refills | Status: DC

## 2017-02-21 NOTE — Progress Notes (Signed)
Subjective:    Holly Greene is being seen today for her first obstetrical visit.  This is not a planned pregnancy. She is at 2953w6d gestation. Her obstetrical history is significant for none. Relationship with FOB: significant other, not living together. Patient does intend to breast feed. Pregnancy history fully reviewed.  The information documented in the HPI was reviewed and verified.  Menstrual History: OB History    Gravida Para Term Preterm AB Living   2 1 1     1    SAB TAB Ectopic Multiple Live Births         0 1      Patient's last menstrual period was 10/26/2016 (approximate).    Past Medical History:  Diagnosis Date  . Asthma    as a child  . Bipolar 1 disorder (HCC)   . Defiant behavior   . Eczema     Past Surgical History:  Procedure Laterality Date  . TONSILLECTOMY       (Not in a hospital admission) No Known Allergies  Social History   Tobacco Use  . Smoking status: Never Smoker  . Smokeless tobacco: Never Used  Substance Use Topics  . Alcohol use: No    Family History  Problem Relation Age of Onset  . Hypertension Mother      Review of Systems Constitutional: negative for weight loss Gastrointestinal: negative for vomiting Genitourinary:negative for genital lesions and vaginal discharge and dysuria Musculoskeletal:negative for back pain Behavioral/Psych: negative for abusive relationship, depression, illegal drug usage and tobacco use    Objective:    BP 130/76   Pulse 91   Wt 194 lb 9.6 oz (88.3 kg)   LMP 10/26/2016 (Approximate)   BMI 30.48 kg/m  General Appearance:    Alert, cooperative, no distress, appears stated age  Head:    Normocephalic, without obvious abnormality, atraumatic  Eyes:    PERRL, conjunctiva/corneas clear, EOM's intact, fundi    benign, both eyes  Ears:    Normal TM's and external ear canals, both ears  Nose:   Nares normal, septum midline, mucosa normal, no drainage    or sinus tenderness  Throat:   Lips,  mucosa, and tongue normal; teeth and gums normal  Neck:   Supple, symmetrical, trachea midline, no adenopathy;    thyroid:  no enlargement/tenderness/nodules; no carotid   bruit or JVD  Back:     Symmetric, no curvature, ROM normal, no CVA tenderness  Lungs:     Clear to auscultation bilaterally, respirations unlabored  Chest Wall:    No tenderness or deformity   Heart:    Regular rate and rhythm, S1 and S2 normal, no murmur, rub   or gallop  Breast Exam:    No tenderness, masses, or nipple abnormality  Abdomen:     Soft, non-tender, bowel sounds active all four quadrants,    no masses, no organomegaly  Genitalia:    Normal female without lesion, discharge or tenderness  Extremities:   Extremities normal, atraumatic, no cyanosis or edema  Pulses:   2+ and symmetric all extremities  Skin:   Skin color, texture, turgor normal, no rashes or lesions  Lymph nodes:   Cervical, supraclavicular, and axillary nodes normal  Neurologic:   CNII-XII intact, normal strength, sensation and reflexes    throughout      Lab Review Urine pregnancy test Labs reviewed yes Radiologic studies reviewed no Assessment:     1. Encounter for supervision of normal pregnancy, antepartum, unspecified gravidity Rx: - Cytology -  PAP - Cervicovaginal ancillary only - Culture, OB Urine - Obstetric Panel, Including HIV - Varicella zoster antibody, IgG - VITAMIN D 25 Hydroxy (Vit-D Deficiency, Fractures) - US MFM OB COMP + 14 WK; Future   Plan:     Prenatal vitamins.  Counseling provided regarding continued use of seat belts, cessation of alcohol consumption, smoking or use of illicit drugs; infection precautions i.e., influenza/TDAP immunizations, toxoplasmosis,CMV, parvovirus, listeria and varicella; workplace safety, exercise during pregnancy; routine dental care, safe medications, sexual activity, hot tubs, saunas, pools, travel, caffeine use, fish and methlymercury, potential toxins, hair treatments,  varicose veins Weight gain recommendations per IOM guidelines reviewed: underweight/BMI< 18.5--> gain 28 - 40 lbs; normal weight/BMI 18.5 - 24.9--> gain 25 - 35 lbs; overweight/BMI 25 - 29.9--> gain 15 - 25 lbs; obese/BMI >30->gain  11 - 20 lbs Problem list reviewed and updated. FIRST/CF mutation testing/NIPT/QUAD SCREEN/fragile X/Ashkenazi Jewish population testing/Spinal muscular atrophy discussed: requested. Role of ultrasound in pregnancy discussed; fetal survey: requested. Amniocentesis discussed: not indicated.  No orders of the defined types were placed in this encounter.  Orders Placed This Encounter  Procedures  . Culture, OB Urine  . US MFM OB COMP + 14 WK    Standing Status:   Future    Standing Expiration Date:   04/22/2018    Order Specific Question:   Reason for Exam (SYMPTOM  OR DIAGNOSIS REQUIRED)    Answer:   Unsure LMP    Order Specific Question:   Preferred Imaging Location?    Answer:   [facility]  . Obstetric Panel, Including HIV  . Varicella zoster antibody, IgG  . VITAMIN D 25 Hydroxy (Vit-D Deficiency, Fractures)    Follow up in 1 weeks. 50% of 20 min visit spent on counseling and coordination of care.    Brock BadHARLES A.  MD

## 2017-02-24 ENCOUNTER — Other Ambulatory Visit: Payer: Self-pay | Admitting: Obstetrics

## 2017-02-24 DIAGNOSIS — N3000 Acute cystitis without hematuria: Secondary | ICD-10-CM

## 2017-02-24 LAB — URINE CULTURE, OB REFLEX

## 2017-02-24 LAB — CULTURE, OB URINE

## 2017-02-24 MED ORDER — NITROFURANTOIN MONOHYD MACRO 100 MG PO CAPS: 100.0000 mg | ORAL_CAPSULE | Freq: Two times a day (BID) | ORAL | 0 refills | Status: DC

## 2017-02-26 ENCOUNTER — Other Ambulatory Visit: Payer: Self-pay | Admitting: Obstetrics

## 2017-02-26 ENCOUNTER — Ambulatory Visit (HOSPITAL_COMMUNITY)
Admission: RE | Admit: 2017-02-26 | Discharge: 2017-02-26 | Disposition: A | Discharge status: Home / Self Care | Payer: Medicaid Other | Source: Ambulatory Visit | Attending: Obstetrics | Admitting: Obstetrics

## 2017-02-26 DIAGNOSIS — E669 Obesity, unspecified: Secondary | ICD-10-CM | POA: Diagnosis not present

## 2017-02-26 DIAGNOSIS — Z3687 Encounter for antenatal screening for uncertain dates: Secondary | ICD-10-CM

## 2017-02-26 DIAGNOSIS — Z3A21 21 weeks gestation of pregnancy: Secondary | ICD-10-CM | POA: Diagnosis not present

## 2017-02-26 DIAGNOSIS — O09892 Supervision of other high risk pregnancies, second trimester: Secondary | ICD-10-CM

## 2017-02-26 DIAGNOSIS — Z3490 Encounter for supervision of normal pregnancy, unspecified, unspecified trimester: Secondary | ICD-10-CM

## 2017-02-26 DIAGNOSIS — O99212 Obesity complicating pregnancy, second trimester: Secondary | ICD-10-CM | POA: Insufficient documentation

## 2017-02-26 DIAGNOSIS — O99210 Obesity complicating pregnancy, unspecified trimester: Secondary | ICD-10-CM

## 2017-02-27 ENCOUNTER — Encounter: Payer: Self-pay | Admitting: Obstetrics

## 2017-03-06 ENCOUNTER — Encounter: Payer: Self-pay | Admitting: Obstetrics and Gynecology

## 2017-03-06 DIAGNOSIS — Z8759 Personal history of other complications of pregnancy, childbirth and the puerperium: Secondary | ICD-10-CM | POA: Insufficient documentation

## 2017-03-06 DIAGNOSIS — J45909 Unspecified asthma, uncomplicated: Secondary | ICD-10-CM | POA: Insufficient documentation

## 2017-03-06 DIAGNOSIS — O2340 Unspecified infection of urinary tract in pregnancy, unspecified trimester: Secondary | ICD-10-CM | POA: Insufficient documentation

## 2017-03-06 DIAGNOSIS — F319 Bipolar disorder, unspecified: Secondary | ICD-10-CM | POA: Insufficient documentation

## 2017-03-06 DIAGNOSIS — O09299 Supervision of pregnancy with other poor reproductive or obstetric history, unspecified trimester: Secondary | ICD-10-CM | POA: Insufficient documentation

## 2017-03-07 ENCOUNTER — Encounter: Payer: Self-pay | Admitting: Obstetrics

## 2017-03-20 ENCOUNTER — Encounter: Payer: Self-pay | Admitting: Obstetrics

## 2017-03-26 ENCOUNTER — Telehealth: Payer: Self-pay | Admitting: Pediatrics

## 2017-03-26 NOTE — Telephone Encounter (Signed)
Pt spoke with Kenney Housemananya at [facility] and advised her [facility] and never [facility] them.  Kenney Housemananya called me and asked that I f/u with patient.  I called [facility] and apparently patient had 2 profiles there and they were filled under the wrong one.  Rx's are being filled now.  I called and advised patient of the situation. She voiced understanding and gratitude for call back.

## 2017-04-04 ENCOUNTER — Encounter: Payer: Self-pay | Admitting: Obstetrics

## 2017-04-14 ENCOUNTER — Inpatient Hospital Stay (HOSPITAL_COMMUNITY)
Admission: AD | Admit: 2017-04-14 | Discharge: 2017-04-14 | Disposition: A | Discharge status: Home / Self Care | Payer: Medicaid Other | Source: Ambulatory Visit | Attending: Obstetrics & Gynecology | Admitting: Obstetrics & Gynecology

## 2017-04-14 ENCOUNTER — Encounter (HOSPITAL_COMMUNITY): Payer: Self-pay | Admitting: Obstetrics and Gynecology

## 2017-04-14 DIAGNOSIS — F319 Bipolar disorder, unspecified: Secondary | ICD-10-CM | POA: Insufficient documentation

## 2017-04-14 DIAGNOSIS — R51 Headache: Secondary | ICD-10-CM | POA: Insufficient documentation

## 2017-04-14 DIAGNOSIS — O26892 Other specified pregnancy related conditions, second trimester: Secondary | ICD-10-CM | POA: Insufficient documentation

## 2017-04-14 DIAGNOSIS — O99342 Other mental disorders complicating pregnancy, second trimester: Secondary | ICD-10-CM | POA: Diagnosis not present

## 2017-04-14 DIAGNOSIS — O99612 Diseases of the digestive system complicating pregnancy, second trimester: Secondary | ICD-10-CM | POA: Insufficient documentation

## 2017-04-14 DIAGNOSIS — O0932 Supervision of pregnancy with insufficient antenatal care, second trimester: Secondary | ICD-10-CM | POA: Insufficient documentation

## 2017-04-14 DIAGNOSIS — J45909 Unspecified asthma, uncomplicated: Secondary | ICD-10-CM | POA: Insufficient documentation

## 2017-04-14 DIAGNOSIS — K2100 Gastro-esophageal reflux disease with esophagitis, without bleeding: Secondary | ICD-10-CM

## 2017-04-14 DIAGNOSIS — O99512 Diseases of the respiratory system complicating pregnancy, second trimester: Secondary | ICD-10-CM | POA: Diagnosis not present

## 2017-04-14 DIAGNOSIS — R112 Nausea with vomiting, unspecified: Secondary | ICD-10-CM | POA: Insufficient documentation

## 2017-04-14 DIAGNOSIS — Z3A24 24 weeks gestation of pregnancy: Secondary | ICD-10-CM | POA: Insufficient documentation

## 2017-04-14 DIAGNOSIS — Z79899 Other long term (current) drug therapy: Secondary | ICD-10-CM | POA: Insufficient documentation

## 2017-04-14 DIAGNOSIS — O219 Vomiting of pregnancy, unspecified: Secondary | ICD-10-CM

## 2017-04-14 LAB — COMPREHENSIVE METABOLIC PANEL
ALK PHOS: 75 U/L (ref 38–126)
ALT: 7 U/L — AB (ref 14–54)
AST: 31 U/L (ref 15–41)
Albumin: 2.9 g/dL — ABNORMAL LOW (ref 3.5–5.0)
Anion gap: 9 (ref 5–15)
BILIRUBIN TOTAL: 0.2 mg/dL — AB (ref 0.3–1.2)
BUN: 5 mg/dL — AB (ref 6–20)
CALCIUM: 8.4 mg/dL — AB (ref 8.9–10.3)
CO2: 19 mmol/L — ABNORMAL LOW (ref 22–32)
CREATININE: 0.35 mg/dL — AB (ref 0.44–1.00)
Chloride: 107 mmol/L (ref 101–111)
Glucose, Bld: 82 mg/dL (ref 65–99)
Potassium: 3.5 mmol/L (ref 3.5–5.1)
Sodium: 135 mmol/L (ref 135–145)
TOTAL PROTEIN: 5.9 g/dL — AB (ref 6.5–8.1)

## 2017-04-14 LAB — RAPID URINE DRUG SCREEN, HOSP PERFORMED
Amphetamines: NOT DETECTED
BARBITURATES: NOT DETECTED
Benzodiazepines: NOT DETECTED
COCAINE: NOT DETECTED
Opiates: NOT DETECTED
TETRAHYDROCANNABINOL: POSITIVE — AB

## 2017-04-14 LAB — URINALYSIS, ROUTINE W REFLEX MICROSCOPIC
BILIRUBIN URINE: NEGATIVE
Glucose, UA: NEGATIVE mg/dL
HGB URINE DIPSTICK: NEGATIVE
Ketones, ur: NEGATIVE mg/dL
NITRITE: NEGATIVE
PH: 9 — AB (ref 5.0–8.0)
Protein, ur: NEGATIVE mg/dL
SPECIFIC GRAVITY, URINE: 1.015 (ref 1.005–1.030)

## 2017-04-14 LAB — CBC
HEMATOCRIT: 29.3 % — AB (ref 36.0–46.0)
Hemoglobin: 10 g/dL — ABNORMAL LOW (ref 12.0–15.0)
MCH: 31.1 pg (ref 26.0–34.0)
MCHC: 34.1 g/dL (ref 30.0–36.0)
MCV: 91 fL (ref 78.0–100.0)
Platelets: 219 10*3/uL (ref 150–400)
RBC: 3.22 MIL/uL — AB (ref 3.87–5.11)
RDW: 12.5 % (ref 11.5–15.5)
WBC: 10.4 10*3/uL (ref 4.0–10.5)

## 2017-04-14 LAB — PROTEIN / CREATININE RATIO, URINE
CREATININE, URINE: 137 mg/dL
Protein Creatinine Ratio: 0.12 mg/mg{Cre} (ref 0.00–0.15)
TOTAL PROTEIN, URINE: 16 mg/dL

## 2017-04-14 MED ORDER — RANITIDINE HCL 150 MG PO TABS: 150.0000 mg | ORAL_TABLET | Freq: Two times a day (BID) | ORAL | 0 refills | Status: DC

## 2017-04-14 MED ORDER — RANITIDINE HCL 150 MG PO TABS: 150.0000 mg | ORAL_TABLET | Freq: Two times a day (BID) | ORAL | 2 refills | Status: DC

## 2017-04-14 MED ORDER — FAMOTIDINE IN NACL 20-0.9 MG/50ML-% IV SOLN
20.0000 mg | Freq: Once | INTRAVENOUS | Status: AC
  Administered 2017-04-14: 20 mg via INTRAVENOUS
  Filled 2017-04-14: qty 50

## 2017-04-14 MED ORDER — DEXAMETHASONE SODIUM PHOSPHATE 10 MG/ML IJ SOLN
10.0000 mg | Freq: Once | INTRAMUSCULAR | Status: DC
  Filled 2017-04-14: qty 1

## 2017-04-14 MED ORDER — METOCLOPRAMIDE HCL 5 MG/ML IJ SOLN
10.0000 mg | Freq: Once | INTRAMUSCULAR | Status: AC
  Administered 2017-04-14: 10 mg via INTRAVENOUS
  Filled 2017-04-14: qty 2

## 2017-04-14 MED ORDER — METOCLOPRAMIDE HCL 10 MG PO TABS: 10.0000 mg | ORAL_TABLET | Freq: Three times a day (TID) | ORAL | 2 refills | Status: DC

## 2017-04-14 MED ORDER — LACTATED RINGERS IV SOLN
INTRAVENOUS | Status: DC
  Administered 2017-04-14: 14:00:00 via INTRAVENOUS

## 2017-04-14 MED ORDER — FAMOTIDINE IN NACL 20-0.9 MG/50ML-% IV SOLN: 20.0000 mg | Freq: Once | INTRAVENOUS | Status: DC

## 2017-04-14 MED ORDER — METOCLOPRAMIDE HCL 10 MG PO TABS: 10.0000 mg | ORAL_TABLET | Freq: Four times a day (QID) | ORAL | 2 refills | Status: DC

## 2017-04-14 MED ORDER — DIPHENHYDRAMINE HCL 50 MG/ML IJ SOLN
25.0000 mg | Freq: Once | INTRAMUSCULAR | Status: DC
  Filled 2017-04-14: qty 1

## 2017-04-14 NOTE — MAU Provider Note (Signed)
Chief Complaint:  Headache and Emesis   None     HPI: Holly Greene is a 22 y.o. G2P1001 at [redacted]w[redacted]d who presents to maternity admissions reporting onset of nausea with vomiting this morning, with some pink tinged emesis by this afternoon. The n/v is associated with h/a but her headache is resolved without treatment before arrival in MAU. She reports n/v in early pregnancy that resolved then sudden onset of vomiting today. She denies sick contacts. She denies fever/chills or diarrhea. She does report daily heartburn.  She started prenatal care at [facility] in November but has been working 2 jobs and has not made it to her other appointments.  She did not pick up her medications prescribed for BV, UTI, and low Vitamin D.  There are no other associated symptoms today. She has not tried any other treatments.  She has history of GHTN with previous pregnancy. She reports good fetal movement, denies LOF, vaginal bleeding, vaginal itching/burning, urinary symptoms, dizziness, or fever/chills.    HPI  Past Medical History: Past Medical History:  Diagnosis Date  . Asthma    as a child  . Bipolar 1 disorder (HCC)   . Defiant behavior   . Eczema     Past obstetric history: OB History  Gravida Para Term Preterm AB Living  2 1 1     1   SAB TAB Ectopic Multiple Live Births        0 1    # Outcome Date GA Lbr Len/2nd Weight Sex Delivery Anes PTL Lv  2 Current           1 Term 07/27/16 [redacted]w[redacted]d 08:36 / 01:13 7 lb 9.4 oz (3.442 kg) F Vag-Spont EPI  LIV      Past Surgical History: Past Surgical History:  Procedure Laterality Date  . TONSILLECTOMY      Family History: Family History  Problem Relation Age of Onset  . Hypertension Mother     Social History: Social History   Tobacco Use  . Smoking status: Never Smoker  . Smokeless tobacco: Never Used  Substance Use Topics  . Alcohol use: No  . Drug use: Yes    Types: Marijuana    Comment: smoked a week ago    Allergies: No Known  Allergies  Meds:  Medications Prior to Admission  Medication Sig Dispense Refill Last Dose  . Prenat-FeAsp-Meth-FA-DHA w/o A (PRENATE PIXIE) 10-0.6-0.4-200 MG CAPS Take 1 tablet by mouth daily. 30 capsule 12 Past Week at Unknown time  . albuterol (PROVENTIL HFA;VENTOLIN HFA) 108 (90 Base) MCG/ACT inhaler Inhale 2 puffs into the lungs every 6 (six) hours as needed for wheezing or shortness of breath. 1 Inhaler 2 Rescue  . Cholecalciferol (VITAMIN D) 2000 units CAPS Take 1 capsule (2,000 Units total) daily before breakfast by mouth. (Patient not taking: Reported on 04/14/2017) 30 capsule 5 Not Taking at Unknown time  . metroNIDAZOLE (FLAGYL) 500 MG tablet Take 1 tablet (500 mg total) 2 (two) times daily by mouth. 14 tablet 2   . nitrofurantoin, macrocrystal-monohydrate, (MACROBID) 100 MG capsule Take 1 capsule (100 mg total) 2 (two) times daily by mouth. (Patient not taking: Reported on 04/14/2017) 1 capsule 0 Not Taking at Unknown time  . terconazole (TERAZOL 3) 0.8 % vaginal cream Place 1 applicator at bedtime vaginally. (Patient not taking: Reported on 04/14/2017) 20 g 0 Not Taking at Unknown time    ROS:  Review of Systems  Constitutional: Negative for chills, fatigue and fever.  Eyes: Negative for  visual disturbance.  Respiratory: Negative for shortness of breath.   Cardiovascular: Negative for chest pain.  Gastrointestinal: Negative for abdominal pain, nausea and vomiting.  Genitourinary: Negative for difficulty urinating, dysuria, flank pain, pelvic pain, vaginal bleeding, vaginal discharge and vaginal pain.  Neurological: Negative for dizziness and headaches.  Psychiatric/Behavioral: Negative.      I have reviewed patient's Past Medical Hx, Surgical Hx, Family Hx, Social Hx, medications and allergies.   Physical Exam   Patient Vitals for the past 24 hrs:  BP Temp Temp src Pulse Resp SpO2 Weight  04/14/17 1416 130/79 - - 92 - - -  04/14/17 1401 124/79 - - 89 - - -  04/14/17 1346  128/79 - - 88 - - -  04/14/17 1336 135/90 - - 97 - - -  04/14/17 1322 (!) 144/94 98.1 F (36.7 C) Oral (!) 106 17 100 % 205 lb 8 oz (93.2 kg)   Constitutional: Well-developed, well-nourished female in no acute distress.  Cardiovascular: normal rate Respiratory: normal effort GI: Abd soft, non-tender, gravid appropriate for gestational age.  MS: Extremities nontender, no edema, normal ROM Neurologic: Alert and oriented x 4.  GU: Neg CVAT.  PELVIC EXAM: Cervix pink, visually closed, without lesion, scant white creamy discharge, vaginal walls and external genitalia normal Bimanual exam: Cervix 0/long/high, firm, anterior, neg CMT, uterus nontender, nonenlarged, adnexa without tenderness, enlargement, or mass     FHT:  Baseline 145 , moderate variability, accelerations present, occasional variable decelerations c/w gestational age Contractions: None on toco or to palpation   Labs: Results for orders placed or performed during the hospital encounter of 04/14/17 (from the past 24 hour(s))  Urinalysis, Routine w reflex microscopic     Status: Abnormal   Collection Time: 04/14/17  1:24 PM  Result Value Ref Range   Color, Urine YELLOW YELLOW   APPearance HAZY (A) CLEAR   Specific Gravity, Urine 1.015 1.005 - 1.030   pH 9.0 (H) 5.0 - 8.0   Glucose, UA NEGATIVE NEGATIVE mg/dL   Hgb urine dipstick NEGATIVE NEGATIVE   Bilirubin Urine NEGATIVE NEGATIVE   Ketones, ur NEGATIVE NEGATIVE mg/dL   Protein, ur NEGATIVE NEGATIVE mg/dL   Nitrite NEGATIVE NEGATIVE   Leukocytes, UA SMALL (A) NEGATIVE   RBC / HPF 0-5 0 - 5 RBC/hpf   WBC, UA 6-30 0 - 5 WBC/hpf   Bacteria, UA RARE (A) NONE SEEN   Squamous Epithelial / LPF 6-30 (A) NONE SEEN   Mucus PRESENT   Protein / creatinine ratio, urine     Status: None   Collection Time: 04/14/17  1:24 PM  Result Value Ref Range   Creatinine, Urine 137.00 mg/dL   Total Protein, Urine 16 mg/dL   Protein Creatinine Ratio 0.12 0.00 - 0.15 mg/mg[Cre]  Urine  rapid drug screen (hosp performed)     Status: Abnormal   Collection Time: 04/14/17  1:24 PM  Result Value Ref Range   Opiates NONE DETECTED NONE DETECTED   Cocaine NONE DETECTED NONE DETECTED   Benzodiazepines NONE DETECTED NONE DETECTED   Amphetamines NONE DETECTED NONE DETECTED   Tetrahydrocannabinol POSITIVE (A) NONE DETECTED   Barbiturates NONE DETECTED NONE DETECTED  CBC     Status: Abnormal   Collection Time: 04/14/17  1:50 PM  Result Value Ref Range   WBC 10.4 4.0 - 10.5 K/uL   RBC 3.22 (L) 3.87 - 5.11 MIL/uL   Hemoglobin 10.0 (L) 12.0 - 15.0 g/dL   HCT 16.129.3 (L) 09.636.0 - 04.546.0 %  MCV 91.0 78.0 - 100.0 fL   MCH 31.1 26.0 - 34.0 pg   MCHC 34.1 30.0 - 36.0 g/dL   RDW 16.1 09.6 - 04.5 %   Platelets 219 150 - 400 K/uL  Comprehensive metabolic panel     Status: Abnormal   Collection Time: 04/14/17  1:50 PM  Result Value Ref Range   Sodium 135 135 - 145 mmol/L   Potassium 3.5 3.5 - 5.1 mmol/L   Chloride 107 101 - 111 mmol/L   CO2 19 (L) 22 - 32 mmol/L   Glucose, Bld 82 65 - 99 mg/dL   BUN 5 (L) 6 - 20 mg/dL   Creatinine, Ser 4.09 (L) 0.44 - 1.00 mg/dL   Calcium 8.4 (L) 8.9 - 10.3 mg/dL   Total Protein 5.9 (L) 6.5 - 8.1 g/dL   Albumin 2.9 (L) 3.5 - 5.0 g/dL   AST 31 15 - 41 U/L   ALT 7 (L) 14 - 54 U/L   Alkaline Phosphatase 75 38 - 126 U/L   Total Bilirubin 0.2 (L) 0.3 - 1.2 mg/dL   GFR calc non Af Amer >60 >60 mL/min   GFR calc Af Amer >60 >60 mL/min   Anion gap 9 5 - 15   O/Positive/-- (11/14 1426)  Imaging:  No results found.  MAU Course/MDM: I have ordered labs and reviewed results.  NST reviewed and reactive with occasional variables, tracing reviewed with Dr Despina Hidden Preeclampsia labs (CBC, CMP, P/C ratio) ordered and wnl, with CBC showing anemia with Hgb 10.0. P/C ratio 0.12. Will treat today for n/v and for heartburn with Reglan and Zantac.  U/A wnl today so will send urine for culture but not treat today. Pt to f[facility]u in office at [facility].  Return to MAU as needed  for emergencies. Rx for baby ASA daily, Zantac 150 mg BID PRN, and Reglan 10 mg TID PRN.   Pt discharge with strict precautions.   Assessment: 1. Nausea and vomiting during pregnancy   2. Limited prenatal care in second trimester   3. Gastroesophageal reflux disease with esophagitis     Plan: Discharge home Labor precautions and fetal kick counts  Allergies as of 04/14/2017   No Known Allergies     Medication List    STOP taking these medications   metroNIDAZOLE 500 MG tablet Commonly known as:  FLAGYL   nitrofurantoin (macrocrystal-monohydrate) 100 MG capsule Commonly known as:  MACROBID   terconazole 0.8 % vaginal cream Commonly known as:  TERAZOL 3     TAKE these medications   albuterol 108 (90 Base) MCG/ACT inhaler Commonly known as:  PROVENTIL HFA;VENTOLIN HFA Inhale 2 puffs into the lungs every 6 (six) hours as needed for wheezing or shortness of breath.   PRENATE PIXIE 10-0.6-0.4-200 MG Caps Take 1 tablet by mouth daily.   Vitamin D 2000 units Caps Take 1 capsule (2,000 Units total) daily before breakfast by mouth.       Sharen Counter Certified Nurse-Midwife 04/14/2017 4:10 PM

## 2017-04-14 NOTE — MAU Note (Signed)
Woke up this morning, with a headache and vomiting.  Emesis started at clear, turned yellow, now is seeing a little blood in it. (approx 150cc in emesis bag)  Has not been to dr since November, when had first appointment.  Was dx with UTI and BV- rx was never called in, so never got treated.  Has appointment on Tues.

## 2017-04-15 ENCOUNTER — Encounter: Payer: Self-pay | Admitting: Obstetrics

## 2017-04-16 LAB — CULTURE, OB URINE

## 2017-04-26 ENCOUNTER — Inpatient Hospital Stay (HOSPITAL_COMMUNITY)
Admission: AD | Admit: 2017-04-26 | Discharge: 2017-04-26 | Disposition: A | Discharge status: Home / Self Care | Payer: Medicaid Other | Source: Ambulatory Visit | Attending: Obstetrics and Gynecology | Admitting: Obstetrics and Gynecology

## 2017-04-26 ENCOUNTER — Other Ambulatory Visit: Payer: Self-pay

## 2017-04-26 ENCOUNTER — Encounter (HOSPITAL_COMMUNITY): Payer: Self-pay

## 2017-04-26 DIAGNOSIS — O99891 Other specified diseases and conditions complicating pregnancy: Secondary | ICD-10-CM

## 2017-04-26 DIAGNOSIS — O26893 Other specified pregnancy related conditions, third trimester: Secondary | ICD-10-CM | POA: Insufficient documentation

## 2017-04-26 DIAGNOSIS — Z3A26 26 weeks gestation of pregnancy: Secondary | ICD-10-CM | POA: Insufficient documentation

## 2017-04-26 DIAGNOSIS — Z8249 Family history of ischemic heart disease and other diseases of the circulatory system: Secondary | ICD-10-CM | POA: Diagnosis not present

## 2017-04-26 DIAGNOSIS — O9989 Other specified diseases and conditions complicating pregnancy, childbirth and the puerperium: Secondary | ICD-10-CM | POA: Diagnosis not present

## 2017-04-26 DIAGNOSIS — M545 Low back pain: Secondary | ICD-10-CM | POA: Insufficient documentation

## 2017-04-26 DIAGNOSIS — M549 Dorsalgia, unspecified: Secondary | ICD-10-CM | POA: Diagnosis not present

## 2017-04-26 DIAGNOSIS — Z3490 Encounter for supervision of normal pregnancy, unspecified, unspecified trimester: Secondary | ICD-10-CM

## 2017-04-26 HISTORY — DX: Essential (primary) hypertension: I10

## 2017-04-26 MED ORDER — CYCLOBENZAPRINE HCL 10 MG PO TABS
10.0000 mg | ORAL_TABLET | Freq: Once | ORAL | Status: AC
  Administered 2017-04-26: 10 mg via ORAL
  Filled 2017-04-26: qty 1

## 2017-04-26 MED ORDER — CYCLOBENZAPRINE HCL 10 MG PO TABS: 10.0000 mg | ORAL_TABLET | Freq: Two times a day (BID) | ORAL | 0 refills | Status: DC | PRN

## 2017-04-26 NOTE — MAU Provider Note (Signed)
History     CSN: [id number]  Arrival date and time: 04/26/17 1022   First Provider Initiated Contact with Patient 04/26/17 1206      Chief Complaint  Patient presents with  . Back Pain  . Dizziness   HPI  Ms.  Holly Greene is a 22 y.o. year old 672P1001 female at 7381w0d weeks gestation who presents to MAU reporting LT lower "back pain with breathing and burping". She states that she was lying in bed all day yesterday and started to have the LT lower back pain. Denies contractions, VB or LOF. She reports good (+) FM.  Past Medical History:  Diagnosis Date  . Asthma    as a child  . Bipolar 1 disorder (HCC)   . Defiant behavior   . Eczema   . Hypertension     Past Surgical History:  Procedure Laterality Date  . TONSILLECTOMY      Family History  Problem Relation Age of Onset  . Hypertension Mother     Social History   Tobacco Use  . Smoking status: Never Smoker  . Smokeless tobacco: Never Used  Substance Use Topics  . Alcohol use: No  . Drug use: Yes    Types: Marijuana    Comment: smoked a week ago    Allergies: No Known Allergies  Medications Prior to Admission  Medication Sig Dispense Refill Last Dose  . Prenat-FeAsp-Meth-FA-DHA w/o A (PRENATE PIXIE) 10-0.6-0.4-200 MG CAPS Take 1 tablet by mouth daily. 30 capsule 12 04/26/2017 at Unknown time  . albuterol (PROVENTIL HFA;VENTOLIN HFA) 108 (90 Base) MCG/ACT inhaler Inhale 2 puffs into the lungs every 6 (six) hours as needed for wheezing or shortness of breath. 1 Inhaler 2 Rescue  . Cholecalciferol (VITAMIN D) 2000 units CAPS Take 1 capsule (2,000 Units total) daily before breakfast by mouth. (Patient not taking: Reported on 04/14/2017) 30 capsule 5 Not Taking at Unknown time  . metoCLOPramide (REGLAN) 10 MG tablet Take 1 tablet (10 mg total) by mouth 3 (three) times daily before meals. (Patient not taking: Reported on 04/26/2017) 90 tablet 2 Not Taking at Unknown time  . ranitidine (ZANTAC) 150 MG tablet Take 1  tablet (150 mg total) by mouth 2 (two) times daily. 60 tablet 2     Review of Systems  Constitutional: Negative.   HENT: Negative.   Eyes: Negative.   Respiratory: Negative.   Cardiovascular: Negative.   Gastrointestinal: Negative.   Endocrine: Negative.   Genitourinary: Negative.   Musculoskeletal: Positive for back pain (lower LT side).  Skin: Negative.   Allergic/Immunologic: Negative.   Neurological: Negative.   Hematological: Negative.   Psychiatric/Behavioral: Negative.    Physical Exam   Blood pressure 130/76, pulse (!) 116, temperature 99.2 F (37.3 C), temperature source Oral, resp. rate 18, weight 201 lb 4 oz (91.3 kg), last menstrual period 10/26/2016, SpO2 100 %, unknown if currently breastfeeding.  Physical Exam  Nursing note and vitals reviewed. Constitutional: She is oriented to person, place, and time. She appears well-developed and well-nourished.  HENT:  Head: Normocephalic.  Eyes: Pupils are equal, round, and reactive to light.  Neck: Normal range of motion.  Respiratory: Effort normal.  Genitourinary:  Genitourinary Comments: Pelvic deferred  Musculoskeletal: Normal range of motion. She exhibits tenderness (LT lower back over hip bone).  Neurological: She is alert and oriented to person, place, and time.  Skin: Skin is warm and dry.  Psychiatric: She has a normal mood and affect. Her behavior is normal. Judgment and thought  content normal.    MAU Course  Procedures  MDM Flexeril 10 mg po now -- minimal relief per pt, but pt found to be asleep upon reassessment NST - FHR: 155 bpm / moderate variability / accels present / decels absent / TOCO: rare UI  Assessment and Plan  Back pain affecting pregnancy in third trimester - Rx Flexeril 10 mg po BID  - Alternate with Tylenol 1000 mg po every 6 hrs prn - Information provided on back pain in pregnancy, back exercises and Flexeril - Advised to apply heating pad or soak in warm tub of water x 15 mins at  a time.  Discharge home Patient verbalized an understanding of the plan of care and agrees.    Raelyn Mora, MSN, CNM 04/26/2017, 1:12 PM

## 2017-04-26 NOTE — MAU Note (Signed)
Last night she was having back pain every time she breathed or burped; even now when she is talking she is having pain in lower back on the left side.  Feeling dizzy

## 2017-04-26 NOTE — Discharge Instructions (Signed)
Apply heating pad to your lower back or soak in a warm tub of water for 15 minutes at a time. You may alternate taking Tylenol 1000 mg every 6 hrs as needed with the Flexeril you have been prescribed for the back pain as well

## 2017-05-01 ENCOUNTER — Inpatient Hospital Stay (HOSPITAL_COMMUNITY)
Admission: AD | Admit: 2017-05-01 | Discharge: 2017-05-04 | DRG: 832 | Disposition: A | Discharge status: Home / Self Care | Payer: Medicaid Other | Source: Ambulatory Visit | Attending: Obstetrics & Gynecology | Admitting: Obstetrics & Gynecology

## 2017-05-01 ENCOUNTER — Other Ambulatory Visit: Payer: Self-pay

## 2017-05-01 ENCOUNTER — Encounter (HOSPITAL_COMMUNITY): Payer: Self-pay

## 2017-05-01 DIAGNOSIS — O99012 Anemia complicating pregnancy, second trimester: Secondary | ICD-10-CM | POA: Diagnosis present

## 2017-05-01 DIAGNOSIS — O98312 Other infections with a predominantly sexual mode of transmission complicating pregnancy, second trimester: Secondary | ICD-10-CM | POA: Diagnosis present

## 2017-05-01 DIAGNOSIS — A5901 Trichomonal vulvovaginitis: Secondary | ICD-10-CM | POA: Diagnosis present

## 2017-05-01 DIAGNOSIS — M549 Dorsalgia, unspecified: Secondary | ICD-10-CM

## 2017-05-01 DIAGNOSIS — N179 Acute kidney failure, unspecified: Secondary | ICD-10-CM | POA: Diagnosis not present

## 2017-05-01 DIAGNOSIS — Z3A26 26 weeks gestation of pregnancy: Secondary | ICD-10-CM | POA: Diagnosis not present

## 2017-05-01 DIAGNOSIS — O2302 Infections of kidney in pregnancy, second trimester: Secondary | ICD-10-CM | POA: Diagnosis not present

## 2017-05-01 DIAGNOSIS — D649 Anemia, unspecified: Secondary | ICD-10-CM | POA: Diagnosis present

## 2017-05-01 DIAGNOSIS — O26832 Pregnancy related renal disease, second trimester: Secondary | ICD-10-CM | POA: Diagnosis not present

## 2017-05-01 DIAGNOSIS — O9989 Other specified diseases and conditions complicating pregnancy, childbirth and the puerperium: Secondary | ICD-10-CM

## 2017-05-01 DIAGNOSIS — A599 Trichomoniasis, unspecified: Secondary | ICD-10-CM | POA: Diagnosis present

## 2017-05-01 DIAGNOSIS — A419 Sepsis, unspecified organism: Secondary | ICD-10-CM

## 2017-05-01 DIAGNOSIS — N12 Tubulo-interstitial nephritis, not specified as acute or chronic: Secondary | ICD-10-CM | POA: Diagnosis present

## 2017-05-01 DIAGNOSIS — I517 Cardiomegaly: Secondary | ICD-10-CM | POA: Diagnosis not present

## 2017-05-01 DIAGNOSIS — O99891 Other specified diseases and conditions complicating pregnancy: Secondary | ICD-10-CM

## 2017-05-01 LAB — COMPREHENSIVE METABOLIC PANEL
ALT: 14 U/L (ref 14–54)
ANION GAP: 12 (ref 5–15)
AST: 55 U/L — AB (ref 15–41)
Albumin: 2.3 g/dL — ABNORMAL LOW (ref 3.5–5.0)
Alkaline Phosphatase: 148 U/L — ABNORMAL HIGH (ref 38–126)
BUN: 10 mg/dL (ref 6–20)
CHLORIDE: 101 mmol/L (ref 101–111)
CO2: 17 mmol/L — AB (ref 22–32)
Calcium: 8 mg/dL — ABNORMAL LOW (ref 8.9–10.3)
Creatinine, Ser: 1.52 mg/dL — ABNORMAL HIGH (ref 0.44–1.00)
GFR calc Af Amer: 55 mL/min — ABNORMAL LOW (ref >60)
GFR calc non Af Amer: 48 mL/min — ABNORMAL LOW (ref >60)
GLUCOSE: 99 mg/dL (ref 65–99)
POTASSIUM: 2.8 mmol/L — AB (ref 3.5–5.1)
SODIUM: 130 mmol/L — AB (ref 135–145)
TOTAL PROTEIN: 6 g/dL — AB (ref 6.5–8.1)
Total Bilirubin: 1.7 mg/dL — ABNORMAL HIGH (ref 0.3–1.2)

## 2017-05-01 LAB — CBC WITH DIFFERENTIAL/PLATELET
BASOS PCT: 0 %
Band Neutrophils: 1 %
Basophils Absolute: 0 10*3/uL (ref 0.0–0.1)
Blasts: 0 %
EOS PCT: 0 %
Eosinophils Absolute: 0 10*3/uL (ref 0.0–0.7)
HCT: 27.4 % — ABNORMAL LOW (ref 36.0–46.0)
HEMOGLOBIN: 9.5 g/dL — AB (ref 12.0–15.0)
LYMPHS ABS: 2.3 10*3/uL (ref 0.7–4.0)
LYMPHS PCT: 16 %
MCH: 30.2 pg (ref 26.0–34.0)
MCHC: 34.7 g/dL (ref 30.0–36.0)
MCV: 87 fL (ref 78.0–100.0)
MONOS PCT: 4 %
MYELOCYTES: 0 %
Metamyelocytes Relative: 0 %
Monocytes Absolute: 0.6 10*3/uL (ref 0.1–1.0)
NEUTROS PCT: 79 %
NRBC: 0 /100{WBCs}
Neutro Abs: 11.4 10*3/uL — ABNORMAL HIGH (ref 1.7–7.7)
OTHER: 0 %
Platelets: 243 10*3/uL (ref 150–400)
Promyelocytes Absolute: 0 %
RBC: 3.15 MIL/uL — AB (ref 3.87–5.11)
RDW: 12.7 % (ref 11.5–15.5)
WBC: 14.3 10*3/uL — AB (ref 4.0–10.5)

## 2017-05-01 LAB — LACTIC ACID, PLASMA
Lactic Acid, Venous: 2 mmol/L (ref 0.5–1.9)
Lactic Acid, Venous: 1.2 mmol/L (ref 0.5–1.9)

## 2017-05-01 LAB — URINALYSIS, ROUTINE W REFLEX MICROSCOPIC
Glucose, UA: NEGATIVE mg/dL
HGB URINE DIPSTICK: NEGATIVE
Ketones, ur: 5 mg/dL — AB
Nitrite: NEGATIVE
PROTEIN: 100 mg/dL — AB
Specific Gravity, Urine: 1.014 (ref 1.005–1.030)
TRANS EPITHEL UA: 1
pH: 5 (ref 5.0–8.0)

## 2017-05-01 LAB — TYPE AND SCREEN
ABO/RH(D): O POS
ANTIBODY SCREEN: NEGATIVE

## 2017-05-01 LAB — RAPID URINE DRUG SCREEN, HOSP PERFORMED
AMPHETAMINES: NOT DETECTED
Barbiturates: NOT DETECTED
Benzodiazepines: NOT DETECTED
Cocaine: NOT DETECTED
Opiates: NOT DETECTED
TETRAHYDROCANNABINOL: POSITIVE — AB

## 2017-05-01 LAB — WET PREP, GENITAL
CLUE CELLS WET PREP: NONE SEEN
SPERM: NONE SEEN
YEAST WET PREP: NONE SEEN

## 2017-05-01 MED ORDER — PIPERACILLIN-TAZOBACTAM 3.375 G IVPB 30 MIN
3.3750 g | Freq: Once | INTRAVENOUS | Status: AC
  Administered 2017-05-01: 3.375 g via INTRAVENOUS
  Filled 2017-05-01: qty 50

## 2017-05-01 MED ORDER — VANCOMYCIN HCL IN DEXTROSE 1-5 GM/200ML-% IV SOLN
1000.0000 mg | Freq: Two times a day (BID) | INTRAVENOUS | Status: DC
  Administered 2017-05-02: 1000 mg via INTRAVENOUS
  Filled 2017-05-01: qty 200

## 2017-05-01 MED ORDER — LACTATED RINGERS IV BOLUS (SEPSIS)
1000.0000 mL | Freq: Once | INTRAVENOUS | Status: AC
  Administered 2017-05-01: 1000 mL via INTRAVENOUS

## 2017-05-01 MED ORDER — METRONIDAZOLE 500 MG PO TABS
2000.0000 mg | ORAL_TABLET | Freq: Once | ORAL | Status: AC
  Administered 2017-05-01: 2000 mg via ORAL
  Filled 2017-05-01: qty 4

## 2017-05-01 MED ORDER — POTASSIUM CHLORIDE IN NACL 20-0.9 MEQ/L-% IV SOLN
INTRAVENOUS | Status: DC
  Filled 2017-05-01: qty 1000

## 2017-05-01 MED ORDER — PIPERACILLIN-TAZOBACTAM 3.375 G IVPB
3.3750 g | Freq: Three times a day (TID) | INTRAVENOUS | Status: DC
  Administered 2017-05-02: 3.375 g via INTRAVENOUS
  Filled 2017-05-01 (×2): qty 50

## 2017-05-01 MED ORDER — SODIUM CHLORIDE 0.9 % IV BOLUS (SEPSIS)
1000.0000 mL | Freq: Once | INTRAVENOUS | Status: AC
  Administered 2017-05-01: 1000 mL via INTRAVENOUS

## 2017-05-01 MED ORDER — DOCUSATE SODIUM 100 MG PO CAPS
100.0000 mg | ORAL_CAPSULE | Freq: Every day | ORAL | Status: DC
  Administered 2017-05-01: 100 mg via ORAL
  Filled 2017-05-01: qty 1

## 2017-05-01 MED ORDER — SODIUM CHLORIDE 0.9 % IV SOLN
INTRAVENOUS | Status: DC
  Administered 2017-05-01 – 2017-05-03 (×4): via INTRAVENOUS
  Filled 2017-05-01 (×8): qty 1000

## 2017-05-01 MED ORDER — POTASSIUM CHLORIDE 2 MEQ/ML IV SOLN: INTRAVENOUS | Status: DC

## 2017-05-01 MED ORDER — VANCOMYCIN HCL IN DEXTROSE 1-5 GM/200ML-% IV SOLN
1000.0000 mg | Freq: Once | INTRAVENOUS | Status: AC
  Administered 2017-05-01: 1000 mg via INTRAVENOUS
  Filled 2017-05-01: qty 200

## 2017-05-01 MED ORDER — PRENATAL MULTIVITAMIN CH
1.0000 | ORAL_TABLET | Freq: Every day | ORAL | Status: DC
  Administered 2017-05-03: 1 via ORAL
  Filled 2017-05-01: qty 1

## 2017-05-01 MED ORDER — CALCIUM CARBONATE ANTACID 500 MG PO CHEW: 2.0000 | CHEWABLE_TABLET | ORAL | Status: DC | PRN

## 2017-05-01 MED ORDER — ZOLPIDEM TARTRATE 5 MG PO TABS: 5.0000 mg | ORAL_TABLET | Freq: Every evening | ORAL | Status: DC | PRN

## 2017-05-01 MED ORDER — SODIUM CHLORIDE 0.9 % IV SOLN
Freq: Once | INTRAVENOUS | Status: AC
  Administered 2017-05-01: 23:00:00 via INTRAVENOUS
  Filled 2017-05-01: qty 1000

## 2017-05-01 MED ORDER — ACETAMINOPHEN 325 MG PO TABS
650.0000 mg | ORAL_TABLET | ORAL | Status: DC | PRN
Start: 2017-05-01 — End: 2017-05-04

## 2017-05-01 NOTE — MAU Provider Note (Signed)
Chief Complaint:  Fever   None     HPI: Holly Greene is a 22 y.o. G2P1001 at 6126w5d who presents to maternity admissions via EMS reporting back pain and urine with foul odor.  On arrival EMS noted a fever of 102 so the pt was given Tylenol 1000 ml PO prior to arrival in MAU.  She reports back pain started 1 week ago and has worsened. She has nausea but no vomiting but reports she has not eaten much in the last 5-6 days because she does not feel well.  She had UTI in November 2018 but never picked up her abx.  On 04/14/17 she was seen in MAU and had normal UA and urine culture showed multiple species/recollection recommended.  On 04/26/17 she presented to MAU with back pain and received Flexeril which helped but she slept most of 24 hours then woke up still in pain.  The pain was constant but mild then worsened in the last couple of days. She was not aware of her fever until EMS arrived today. She reports good fetal movement, denies abdominal pain, LOF, vaginal bleeding, vaginal itching/burning, urinary symptoms, h/a,, or dizziness.  HPI  Past Medical History: Past Medical History:  Diagnosis Date  . Asthma    as a child  . Bipolar 1 disorder (HCC)   . Defiant behavior   . Eczema   . Hypertension     Past obstetric history: OB History  Gravida Para Term Preterm AB Living  2 1 1     1   SAB TAB Ectopic Multiple Live Births        0 1    # Outcome Date GA Lbr Len/2nd Weight Sex Delivery Anes PTL Lv  2 Current           1 Term 07/27/16 5376w4d 08:36 / 01:13 7 lb 9.4 oz (3.442 kg) F Vag-Spont EPI  LIV      Past Surgical History: Past Surgical History:  Procedure Laterality Date  . TONSILLECTOMY      Family History: Family History  Problem Relation Age of Onset  . Hypertension Mother     Social History: Social History   Tobacco Use  . Smoking status: Never Smoker  . Smokeless tobacco: Never Used  Substance Use Topics  . Alcohol use: No  . Drug use: Yes    Types: Marijuana     Comment: smoked a week ago    Allergies: No Known Allergies  Meds:  Medications Prior to Admission  Medication Sig Dispense Refill Last Dose  . albuterol (PROVENTIL HFA;VENTOLIN HFA) 108 (90 Base) MCG/ACT inhaler Inhale 2 puffs into the lungs every 6 (six) hours as needed for wheezing or shortness of breath. 1 Inhaler 2 Rescue  . Cholecalciferol (VITAMIN D) 2000 units CAPS Take 1 capsule (2,000 Units total) daily before breakfast by mouth. (Patient not taking: Reported on 04/14/2017) 30 capsule 5 Not Taking at Unknown time  . cyclobenzaprine (FLEXERIL) 10 MG tablet Take 1 tablet (10 mg total) by mouth 2 (two) times daily as needed for muscle spasms. 20 tablet 0   . metoCLOPramide (REGLAN) 10 MG tablet Take 1 tablet (10 mg total) by mouth 3 (three) times daily before meals. (Patient not taking: Reported on 04/26/2017) 90 tablet 2 Not Taking at Unknown time  . Prenat-FeAsp-Meth-FA-DHA w/o A (PRENATE PIXIE) 10-0.6-0.4-200 MG CAPS Take 1 tablet by mouth daily. 30 capsule 12 04/26/2017 at Unknown time  . ranitidine (ZANTAC) 150 MG tablet Take 1 tablet (150 mg  total) by mouth 2 (two) times daily. 60 tablet 2     ROS:  Review of Systems  Constitutional: Positive for diaphoresis and fever. Negative for chills and fatigue.  HENT: Negative for congestion and rhinorrhea.   Eyes: Negative for visual disturbance.  Respiratory: Negative for cough and shortness of breath.   Cardiovascular: Negative for chest pain.  Gastrointestinal: Negative for abdominal pain and vomiting.  Genitourinary: Negative for difficulty urinating, dysuria, flank pain, pelvic pain, vaginal bleeding, vaginal discharge and vaginal pain.  Neurological: Negative for dizziness and headaches.  Psychiatric/Behavioral: Negative.      I have reviewed patient's Past Medical Hx, Surgical Hx, Family Hx, Social Hx, medications and allergies.   Physical Exam   Patient Vitals for the past 24 hrs:  BP Temp Temp src Pulse Resp SpO2   05/01/17 2003 - - - - (!) 33 -  05/01/17 1951 - 98.8 F (37.1 C) Oral (!) 107 - -  05/01/17 1900 114/64 (!) 102.8 F (39.3 C) Oral (!) 132 18 99 %   Constitutional: Well-developed, well-nourished female in no acute distress.  Cardiovascular: normal rate Respiratory: normal effort GI: Abd soft, non-tender, gravid appropriate for gestational age.  MS: Extremities nontender, no edema, normal ROM Neurologic: Alert and oriented x 4.  GU: Neg CVAT.  PELVIC EXAM: Cervix pink, visually closed, without lesion, scant white creamy discharge, vaginal walls and external genitalia normal Bimanual exam: Cervix 0/long/high, firm, anterior, neg CMT, uterus nontender, nonenlarged, adnexa without tenderness, enlargement, or mass     FHT:  On arrival FHR baseline 175 with late decel x 1. After 1000 ml IV fluids, baseline 150 , moderate variability, accelerations present, no decelerations Contractions: Q 30 minutes, mild to palpation   Labs: Results for orders placed or performed during the hospital encounter of 05/01/17 (from the past 24 hour(s))  Comprehensive metabolic panel     Status: Abnormal   Collection Time: 05/01/17  7:09 PM  Result Value Ref Range   Sodium 130 (L) 135 - 145 mmol/L   Potassium 2.8 (L) 3.5 - 5.1 mmol/L   Chloride 101 101 - 111 mmol/L   CO2 17 (L) 22 - 32 mmol/L   Glucose, Bld 99 65 - 99 mg/dL   BUN 10 6 - 20 mg/dL   Creatinine, Ser 4.09 (H) 0.44 - 1.00 mg/dL   Calcium 8.0 (L) 8.9 - 10.3 mg/dL   Total Protein 6.0 (L) 6.5 - 8.1 g/dL   Albumin 2.3 (L) 3.5 - 5.0 g/dL   AST 55 (H) 15 - 41 U/L   ALT 14 14 - 54 U/L   Alkaline Phosphatase 148 (H) 38 - 126 U/L   Total Bilirubin 1.7 (H) 0.3 - 1.2 mg/dL   GFR calc non Af Amer 48 (L) >60 mL/min   GFR calc Af Amer 55 (L) >60 mL/min   Anion gap 12 5 - 15  CBC with Differential/Platelet     Status: Abnormal   Collection Time: 05/01/17  7:09 PM  Result Value Ref Range   WBC 14.3 (H) 4.0 - 10.5 K/uL   RBC 3.15 (L) 3.87 - 5.11  MIL/uL   Hemoglobin 9.5 (L) 12.0 - 15.0 g/dL   HCT 81.1 (L) 91.4 - 78.2 %   MCV 87.0 78.0 - 100.0 fL   MCH 30.2 26.0 - 34.0 pg   MCHC 34.7 30.0 - 36.0 g/dL   RDW 95.6 21.3 - 08.6 %   Platelets 243 150 - 400 K/uL   Neutrophils Relative % 79 %  Lymphocytes Relative 16 %   Monocytes Relative 4 %   Eosinophils Relative 0 %   Basophils Relative 0 %   Band Neutrophils 1 %   Metamyelocytes Relative 0 %   Myelocytes 0 %   Promyelocytes Absolute 0 %   Blasts 0 %   nRBC 0 0 /100 WBC   Other 0 %   Neutro Abs 11.4 (H) 1.7 - 7.7 K/uL   Lymphs Abs 2.3 0.7 - 4.0 K/uL   Monocytes Absolute 0.6 0.1 - 1.0 K/uL   Eosinophils Absolute 0.0 0.0 - 0.7 K/uL   Basophils Absolute 0.0 0.0 - 0.1 K/uL  Lactic acid, plasma     Status: Abnormal   Collection Time: 05/01/17  7:53 PM  Result Value Ref Range   Lactic Acid, Venous 2.0 (HH) 0.5 - 1.9 mmol/L   O/Positive/-- (11/14 1426)  Imaging:  No results found.  MAU Course/MDM: Sepsis labs ordered on arrival with CBC with diff, CMP, lactic acid, and blood cultures x 2 NST reviewed and reactive after fluid bolus I&O cath for urine, pt unable to urinate Pt with recent history of UTI so likely pyelonephritis, no other known source of fever Hypokalemia with K of 2.8 Consult Dr Despina Hidden with presentation, exam findings and test results.  Initiate sepsis protocol/focused orders NS bolus, replace K in IV fluids Admit to HROB Unit  Assessment: 1. Pyelonephritis affecting pregnancy in second trimester   2. Back pain affecting pregnancy in third trimester     Plan: Admit to HROB Unit Lactic acid 2.0 so repeat in 6 hours Fluid bolus continues, 3,000 ml based on protocol and pt weight Continuous EFM/toco  Sharen Counter Certified Nurse-Midwife 05/01/2017 8:56 PM

## 2017-05-01 NOTE — MAU Note (Signed)
Pt. States that she was having pain at home on the left side.   Reports good fetal movement

## 2017-05-01 NOTE — Progress Notes (Signed)
Pharmacy Antibiotic Note  Holly Greene is a 22 y.o. female at about 26 weeks 5 days gestation admitted on 05/01/2017 with sepsis (pyelonephritis).   Pharmacy has been consulted for vancomycin and zosyn dosing.  Vancomycin 1g IV x1 and Zosyn 3.375 g IV x 1 have been ordered.  SCr noted to be elevated at 1.52 >>CrCl ~ 66 ml/min.  Plan: - Zosyn 3.375G IV q8h to be infused over 4 hours - Vancomycin 1g IV q12h, goal trough ~ 15  - Follow up SCr, UOP, cultures, clinical course and adjust as clinically indicated       Temp (24hrs), Avg:99.8 F (37.7 C), Min:97.7 F (36.5 C), Max:102.8 F (39.3 C)  Recent Labs  Lab 05/01/17 1909 05/01/17 1953  WBC 14.3*  --   CREATININE 1.52*  --   LATICACIDVEN  --  2.0*    CrCl cannot be calculated (Unknown ideal weight.).    No Known Allergies  Antimicrobials this admission: Vancomycin 1/23 >>  Zosyn  1/23 >>   Dose adjustments this admission:   Microbiology results:  1/23 BCx x 2: pending   UCx:    Sputum:    MRSA PCR:   Thank you for allowing pharmacy to be a part of this patient's care.  Holly Greene, Holly Greene 05/01/2017 9:07 PM

## 2017-05-02 ENCOUNTER — Inpatient Hospital Stay (HOSPITAL_COMMUNITY): Payer: Medicaid Other

## 2017-05-02 DIAGNOSIS — N12 Tubulo-interstitial nephritis, not specified as acute or chronic: Secondary | ICD-10-CM | POA: Diagnosis present

## 2017-05-02 DIAGNOSIS — O2302 Infections of kidney in pregnancy, second trimester: Principal | ICD-10-CM

## 2017-05-02 DIAGNOSIS — N179 Acute kidney failure, unspecified: Secondary | ICD-10-CM | POA: Diagnosis present

## 2017-05-02 DIAGNOSIS — A599 Trichomoniasis, unspecified: Secondary | ICD-10-CM | POA: Diagnosis present

## 2017-05-02 DIAGNOSIS — I517 Cardiomegaly: Secondary | ICD-10-CM

## 2017-05-02 DIAGNOSIS — Z3A26 26 weeks gestation of pregnancy: Secondary | ICD-10-CM

## 2017-05-02 LAB — COMPREHENSIVE METABOLIC PANEL
ALBUMIN: 2 g/dL — AB (ref 3.5–5.0)
ALK PHOS: 123 U/L (ref 38–126)
ALT: 12 U/L — AB (ref 14–54)
ANION GAP: 9 (ref 5–15)
AST: 47 U/L — ABNORMAL HIGH (ref 15–41)
BILIRUBIN TOTAL: 1.1 mg/dL (ref 0.3–1.2)
BUN: 9 mg/dL (ref 6–20)
CALCIUM: 7.1 mg/dL — AB (ref 8.9–10.3)
CO2: 17 mmol/L — AB (ref 22–32)
Chloride: 109 mmol/L (ref 101–111)
Creatinine, Ser: 1.12 mg/dL — ABNORMAL HIGH (ref 0.44–1.00)
GFR calc Af Amer: >60 mL/min (ref >60)
GFR calc non Af Amer: >60 mL/min (ref >60)
GLUCOSE: 96 mg/dL (ref 65–99)
Potassium: 3.6 mmol/L (ref 3.5–5.1)
SODIUM: 135 mmol/L (ref 135–145)
TOTAL PROTEIN: 5.1 g/dL — AB (ref 6.5–8.1)

## 2017-05-02 LAB — CBC WITH DIFFERENTIAL/PLATELET
Basophils Absolute: 0 10*3/uL (ref 0.0–0.1)
Basophils Relative: 0 %
EOS ABS: 0 10*3/uL (ref 0.0–0.7)
EOS PCT: 0 %
HCT: 24.3 % — ABNORMAL LOW (ref 36.0–46.0)
HEMOGLOBIN: 8.5 g/dL — AB (ref 12.0–15.0)
LYMPHS ABS: 2.1 10*3/uL (ref 0.7–4.0)
LYMPHS PCT: 17 %
MCH: 30.4 pg (ref 26.0–34.0)
MCHC: 35 g/dL (ref 30.0–36.0)
MCV: 86.8 fL (ref 78.0–100.0)
MONOS PCT: 4 %
Monocytes Absolute: 0.5 10*3/uL (ref 0.1–1.0)
NEUTROS PCT: 79 %
Neutro Abs: 10 10*3/uL — ABNORMAL HIGH (ref 1.7–7.7)
Platelets: 210 10*3/uL (ref 150–400)
RBC: 2.8 MIL/uL — ABNORMAL LOW (ref 3.87–5.11)
RDW: 12.7 % (ref 11.5–15.5)
WBC: 12.7 10*3/uL — ABNORMAL HIGH (ref 4.0–10.5)

## 2017-05-02 LAB — ECHOCARDIOGRAM COMPLETE
Height: 67 in
WEIGHTICAEL: 3120 [oz_av]

## 2017-05-02 LAB — VITAMIN B12: VITAMIN B 12: 576 pg/mL (ref 180–914)

## 2017-05-02 LAB — FOLATE: FOLATE: 16.5 ng/mL (ref >5.9)

## 2017-05-02 LAB — RETICULOCYTES
RBC.: 2.79 MIL/uL — ABNORMAL LOW (ref 3.87–5.11)
RETIC COUNT ABSOLUTE: 27.9 10*3/uL (ref 19.0–186.0)
Retic Ct Pct: 1 % (ref 0.4–3.1)

## 2017-05-02 LAB — GC/CHLAMYDIA PROBE AMP (~~LOC~~) NOT AT ARMC
Chlamydia: NEGATIVE
Neisseria Gonorrhea: NEGATIVE

## 2017-05-02 LAB — IRON AND TIBC
IRON: 13 ug/dL — AB (ref 28–170)
Saturation Ratios: 5 % — ABNORMAL LOW (ref 10.4–31.8)
TIBC: 283 ug/dL (ref 250–450)
UIBC: 270 ug/dL

## 2017-05-02 LAB — FERRITIN: Ferritin: 121 ng/mL (ref 11–307)

## 2017-05-02 MED ORDER — POTASSIUM CHLORIDE IN NACL 20-0.9 MEQ/L-% IV SOLN
INTRAVENOUS | Status: DC
  Filled 2017-05-02: qty 1000

## 2017-05-02 MED ORDER — SODIUM CHLORIDE 0.9 % IV SOLN
510.0000 mg | Freq: Once | INTRAVENOUS | Status: DC
  Filled 2017-05-02: qty 17

## 2017-05-02 MED ORDER — DOCUSATE SODIUM 100 MG PO CAPS: 100.0000 mg | ORAL_CAPSULE | Freq: Two times a day (BID) | ORAL | Status: DC | PRN

## 2017-05-02 MED ORDER — ENOXAPARIN SODIUM 40 MG/0.4ML ~~LOC~~ SOLN
40.0000 mg | Freq: Every day | SUBCUTANEOUS | Status: DC
  Administered 2017-05-02 – 2017-05-03 (×2): 40 mg via SUBCUTANEOUS
  Filled 2017-05-02 (×3): qty 0.4

## 2017-05-02 MED ORDER — PANTOPRAZOLE SODIUM 40 MG PO TBEC
40.0000 mg | DELAYED_RELEASE_TABLET | Freq: Every day | ORAL | Status: DC
  Administered 2017-05-02 – 2017-05-03 (×2): 40 mg via ORAL
  Filled 2017-05-02 (×2): qty 1

## 2017-05-02 MED ORDER — FAMOTIDINE 20 MG PO TABS: 20.0000 mg | ORAL_TABLET | Freq: Two times a day (BID) | ORAL | Status: DC

## 2017-05-02 MED ORDER — SODIUM CHLORIDE 0.9 % IV SOLN: INTRAVENOUS | Status: DC

## 2017-05-02 MED ORDER — SODIUM CHLORIDE 0.9 % IV SOLN
8.0000 mg | Freq: Three times a day (TID) | INTRAVENOUS | Status: DC | PRN
  Administered 2017-05-02: 8 mg via INTRAVENOUS
  Filled 2017-05-02: qty 4

## 2017-05-02 MED ORDER — ACETAMINOPHEN 500 MG PO TABS
1000.0000 mg | ORAL_TABLET | Freq: Once | ORAL | Status: AC
  Administered 2017-05-02: 1000 mg via ORAL
  Filled 2017-05-02: qty 2

## 2017-05-02 MED ORDER — DEXTROSE 5 % IV SOLN
2.0000 g | INTRAVENOUS | Status: DC
  Administered 2017-05-02: 2 g via INTRAVENOUS
  Filled 2017-05-02 (×2): qty 2

## 2017-05-02 MED ORDER — POTASSIUM CHLORIDE IN NACL 20-0.9 MEQ/L-% IV SOLN
INTRAVENOUS | Status: AC
  Administered 2017-05-02: 06:00:00 via INTRAVENOUS
  Filled 2017-05-02 (×2): qty 1000

## 2017-05-02 NOTE — Progress Notes (Signed)
Patient ID: Holly Greene, female   DOB: Jan 22, 1996, 22 y.o.   MRN: [id number] FACULTY PRACTICE ANTEPARTUM(COMPREHENSIVE) NOTE  Holly Greene is a 22 y.o. G2P1001 with Estimated Date of Delivery: 08/02/17  6512w6d  who is admitted for probable pyelonephritis and empiric support for sepsis.    Fetal presentation is unsure. Length of Stay:  1  Days  Date of admission:05/01/2017  Subjective: Pt is feeling better this am, back pain is much better Patient reports the fetal movement as active. Patient reports uterine contraction  activity as none. Patient reports  vaginal bleeding as none. Patient describes fluid per vagina as None.  Vitals:  Blood pressure (!) 98/45, pulse (!) 103, temperature 98.7 F (37.1 C), temperature source Oral, resp. rate 18, height 5\' 7"  (1.702 m), weight 195 lb (88.5 kg), last menstrual period 10/26/2016, SpO2 96 %, unknown if currently breastfeeding. Vitals:   05/02/17 0641 05/02/17 0701 05/02/17 0711 05/02/17 0718  BP: (!) 100/49 (!) 97/47 (!) 98/45   Pulse: (!) 106 (!) 109 (!) 103   Resp: 18     Temp: 98.2 F (36.8 C)   98.7 F (37.1 C)  TempSrc: Oral   Oral  SpO2: 98%   96%  Weight:      Height:       Physical Examination:  General appearance - alert, well appearing, and in no distress Back exam - +right CVAT Lungs clear Fundal Height:  size equals dates Pelvic Exam:   Cervical Exam:  Extremities: extremities normal, atraumatic, no cyanosis or edema with DTRs 2+ bilaterally Membranes:intact  Fetal Monitoring:  Reassuring for EGA    Labs:  Results for orders placed or performed during the hospital encounter of 05/01/17 (from the past 24 hour(s))  Comprehensive metabolic panel   Collection Time: 05/01/17  7:09 PM  Result Value Ref Range   Sodium 130 (L) 135 - 145 mmol/L   Potassium 2.8 (L) 3.5 - 5.1 mmol/L   Chloride 101 101 - 111 mmol/L   CO2 17 (L) 22 - 32 mmol/L   Glucose, Bld 99 65 - 99 mg/dL   BUN 10 6 - 20 mg/dL   Creatinine, Ser 4.091.52  (H) 0.44 - 1.00 mg/dL   Calcium 8.0 (L) 8.9 - 10.3 mg/dL   Total Protein 6.0 (L) 6.5 - 8.1 g/dL   Albumin 2.3 (L) 3.5 - 5.0 g/dL   AST 55 (H) 15 - 41 U/L   ALT 14 14 - 54 U/L   Alkaline Phosphatase 148 (H) 38 - 126 U/L   Total Bilirubin 1.7 (H) 0.3 - 1.2 mg/dL   GFR calc non Af Amer 48 (L) >60 mL/min   GFR calc Af Amer 55 (L) >60 mL/min   Anion gap 12 5 - 15  CBC with Differential/Platelet   Collection Time: 05/01/17  7:09 PM  Result Value Ref Range   WBC 14.3 (H) 4.0 - 10.5 K/uL   RBC 3.15 (L) 3.87 - 5.11 MIL/uL   Hemoglobin 9.5 (L) 12.0 - 15.0 g/dL   HCT 81.127.4 (L) 91.436.0 - 78.246.0 %   MCV 87.0 78.0 - 100.0 fL   MCH 30.2 26.0 - 34.0 pg   MCHC 34.7 30.0 - 36.0 g/dL   RDW 95.612.7 21.311.5 - 08.615.5 %   Platelets 243 150 - 400 K/uL   Neutrophils Relative % 79 %   Lymphocytes Relative 16 %   Monocytes Relative 4 %   Eosinophils Relative 0 %   Basophils Relative 0 %   Band Neutrophils 1 %  Metamyelocytes Relative 0 %   Myelocytes 0 %   Promyelocytes Absolute 0 %   Blasts 0 %   nRBC 0 0 /100 WBC   Other 0 %   Neutro Abs 11.4 (H) 1.7 - 7.7 K/uL   Lymphs Abs 2.3 0.7 - 4.0 K/uL   Monocytes Absolute 0.6 0.1 - 1.0 K/uL   Eosinophils Absolute 0.0 0.0 - 0.7 K/uL   Basophils Absolute 0.0 0.0 - 0.1 K/uL  Lactic acid, plasma   Collection Time: 05/01/17  7:53 PM  Result Value Ref Range   Lactic Acid, Venous 2.0 (HH) 0.5 - 1.9 mmol/L  Urinalysis, Routine w reflex microscopic   Collection Time: 05/01/17  8:24 PM  Result Value Ref Range   Color, Urine AMBER (A) YELLOW   APPearance CLOUDY (A) CLEAR   Specific Gravity, Urine 1.014 1.005 - 1.030   pH 5.0 5.0 - 8.0   Glucose, UA NEGATIVE NEGATIVE mg/dL   Hgb urine dipstick NEGATIVE NEGATIVE   Bilirubin Urine SMALL (A) NEGATIVE   Ketones, ur 5 (A) NEGATIVE mg/dL   Protein, ur 161 (A) NEGATIVE mg/dL   Nitrite NEGATIVE NEGATIVE   Leukocytes, UA MODERATE (A) NEGATIVE   RBC / HPF 6-30 0 - 5 RBC/hpf   WBC, UA TOO NUMEROUS TO COUNT 0 - 5 WBC/hpf    Bacteria, UA MANY (A) NONE SEEN   Squamous Epithelial / LPF 0-5 (A) NONE SEEN   Trans Epithel, UA 1    WBC Clumps PRESENT    Mucus PRESENT    Hyaline Casts, UA PRESENT   Urine rapid drug screen (hosp performed)   Collection Time: 05/01/17  8:24 PM  Result Value Ref Range   Opiates NONE DETECTED NONE DETECTED   Cocaine NONE DETECTED NONE DETECTED   Benzodiazepines NONE DETECTED NONE DETECTED   Amphetamines NONE DETECTED NONE DETECTED   Tetrahydrocannabinol POSITIVE (A) NONE DETECTED   Barbiturates NONE DETECTED NONE DETECTED  Type and screen [facility]   Collection Time: 05/01/17  8:46 PM  Result Value Ref Range   ABO/RH(D) O POS    Antibody Screen NEG    Sample Expiration 05/04/2017   Wet prep, genital   Collection Time: 05/01/17  9:00 PM  Result Value Ref Range   Yeast Wet Prep HPF POC NONE SEEN NONE SEEN   Trich, Wet Prep PRESENT (A) NONE SEEN   Clue Cells Wet Prep HPF POC NONE SEEN NONE SEEN   WBC, Wet Prep HPF POC MANY (A) NONE SEEN   Sperm NONE SEEN   Lactic acid, plasma   Collection Time: 05/01/17 10:14 PM  Result Value Ref Range   Lactic Acid, Venous 1.2 0.5 - 1.9 mmol/L    Imaging Studies:      Medications:  Scheduled . docusate sodium  100 mg Oral Daily  . pantoprazole  40 mg Oral Daily  . prenatal multivitamin  1 tablet Oral Q1200   I have reviewed the patient's current medications.  ASSESSMENT: G2P1001 [redacted]w[redacted]d Estimated Date of Delivery: 08/02/17  Probable pyelonephritis Possible sepsis, most likely uropathogen  Patient Active Problem List   Diagnosis Date Noted  . Back pain affecting pregnancy in third trimester 04/26/2017  . Bipolar disorder (HCC) 03/06/2017  . Asthma 03/06/2017  . UTI in pregnancy 03/06/2017  . History of gestational hypertension 03/06/2017  . Encounter for supervision of normal pregnancy, unspecified, unspecified trimester 02/20/2017    PLAN: Continue vancomycin and zosyn(the E coli she grew out in November  (is sensitive to  zosyn) until blood and urine cultures are back  Recheck CBC  CXR  Holly Greene 05/02/2017,7:32 AM

## 2017-05-02 NOTE — Progress Notes (Signed)
Pharmacy Antibiotic Note / Lovenox  Holly Greene is a 22 y.o. female G2P1001 @ 3255w6d admitted on 05/01/2017 with suspected pyelonephritis.  Pharmacy has been consulted for Ceftriaxone and lovenox for prophylaxis  Plan: Rocephin 2 grams IV Q 24hr Lovenox 40 mg SQ Q 24hr  Height: 5\' 7"  (170.2 cm) Weight: 195 lb (88.5 kg) IBW/kg (Calculated) : 61.6  Temp (24hrs), Avg:99.1 F (37.3 C), Min:97.5 F (36.4 C), Max:102.8 F (39.3 C)  Recent Labs  Lab 05/01/17 1909 05/01/17 1953 05/01/17 2214 05/02/17 0812  WBC 14.3*  --   --  12.7*  CREATININE 1.52*  --   --  1.12*  LATICACIDVEN  --  2.0* 1.2  --     Estimated Creatinine Clearance: 90.1 mL/min (A) (by C-G formula based on SCr of 1.12 mg/dL (H)).    No Known Allergies  Antimicrobials this admission: Vancomycin 1/23 >> 1/24 Zosyn  1/23 >> 1/24 Ceftriaxone 1/24 >>   Microbiology results: 1/23 UJW:JXBJYNWBCx:pending 1/24 GNF:AOZHYQMCx:pending  Thank you for allowing pharmacy to be a part of this patient's care.  Natasha BenceCline, Yandiel Bergum 05/02/2017 11:43 AM

## 2017-05-02 NOTE — Progress Notes (Signed)
Call to Dr. Despina HiddenEure regarding pt's sustained tachycardia and FHR baseline change to tachycardia.  Verbal order for IVF and 1000 tylenol Pharmacy aware.  Will warm fluid in the blanket warmer prior to admin.  Will continue to monitor pt. closely

## 2017-05-02 NOTE — Progress Notes (Signed)
  Echocardiogram 2D Echocardiogram has been performed.  Holly Greene [name] 05/02/2017, 10:43 AM

## 2017-05-02 NOTE — Progress Notes (Signed)
Dr. Despina HiddenEure notified.

## 2017-05-02 NOTE — Progress Notes (Addendum)
Daily Antepartum Note  Admission Date: 05/01/2017 Current Date: 05/02/2017 11:28 AM  Holly Greene is a 22 y.o. G2P1001 @ [redacted]w[redacted]d HD#2, admitted for right sided pyelo, sepsis. Pt also diagnosed with trich and anemia.   Pregnancy complicated by: Patient Active Problem List   Diagnosis Date Noted  . Pyelonephritis 05/02/2017  . Trichimoniasis 05/02/2017  . Back pain affecting pregnancy in third trimester 04/26/2017  . Bipolar disorder (HCC) 03/06/2017  . Asthma 03/06/2017  . UTI in pregnancy 03/06/2017  . History of gestational hypertension 03/06/2017  . Encounter for supervision of normal pregnancy, unspecified, unspecified trimester 02/20/2017    Overnight/24hr events:  See prior notes  Subjective:  Feeling better. No PTL s/s or decreased FM. No fevers, chills, back pain. Endorses heartburn but no cp, sob, cough  Objective:    Current Vital Signs 24h Vital Sign Ranges  T 97.6 F (36.4 C) Temp  Avg: 99.1 F (37.3 C)  Min: 97.5 F (36.4 C)  Max: 102.8 F (39.3 C)  BP (!) 94/51 BP  Min: 93/47  Max: 117/69  HR (!) 104 Pulse  Avg: 112  Min: 86  Max: 132  RR 19 Resp  Avg: 21.4  Min: 17  Max: 36  SaO2 99 % RA SpO2  Avg: 98.9 %  Min: 96 %  Max: 100 %       24 Hour I/O Current Shift I/O  Time Ins Outs 01/23 0701 - 01/24 0700 In: 2437.5 [I.V.:2387.5] Out: 400 [Urine:400] 01/24 0701 - 01/24 1900 In: -  Out: 201 [Urine:200]   Patient Vitals for the past 24 hrs:  BP Temp Temp src Pulse Resp SpO2 Height Weight  05/02/17 0913 - - - - 19 99 % - -  05/02/17 0815 - - - - - 100 % - -  05/02/17 0811 (!) 94/51 97.6 F (36.4 C) Oral (!) 104 17 - - -  05/02/17 0745 - - - - - 97 % - -  05/02/17 0731 (!) 101/51 - - (!) 101 - - - -  05/02/17 0722 (!) 114/55 - - (!) 106 - - - -  05/02/17 0718 - 98.7 F (37.1 C) Oral - - 96 % - -  05/02/17 0711 (!) 98/45 - - (!) 103 - - - -  05/02/17 0701 (!) 97/47 - - (!) 109 - - - -  05/02/17 0641 (!) 100/49 98.2 F (36.8 C) Oral (!) 106 18 98 % - -   05/02/17 0621 (!) 100/53 - - (!) 107 18 96 % - -  05/02/17 0612 (!) 93/47 - - (!) 101 - - - -  05/02/17 0601 (!) 93/38 - - (!) 115 - 99 % - -  05/02/17 0521 (!) 111/48 99.4 F (37.4 C) Oral (!) 130 18 100 % - -  05/02/17 0510 (!) 103/47 99.4 F (37.4 C) Oral (!) 128 - 99 % - -  05/02/17 0504 (!) 98/38 - - (!) 128 - 100 % - -  05/02/17 0501 (!) 93/41 - - (!) 123 - 100 % - -  05/02/17 0440 - - - - - 100 % - -  05/02/17 0439 (!) 110/51 - - (!) 129 - - - -  05/02/17 0437 - 99.9 F (37.7 C) Oral - - - - -  05/02/17 0425 - (!) 102.1 F (38.9 C) Oral - - - - -  05/02/17 0420 110/60 98.9 F (37.2 C) Oral (!) 125 20 100 % - -  05/02/17 0408 110/60 - - Marland Kitchen)  127 - - - -  05/02/17 0259 117/69 98.6 F (37 C) Oral (!) 111 20 100 % - -  05/02/17 0045 (!) 100/56 (!) 97.5 F (36.4 C) Oral 86 20 99 % 5\' 7"  (1.702 m) 195 lb (88.5 kg)  05/01/17 2231 - - - - - 99 % - -  05/01/17 2216 - 97.9 F (36.6 C) Oral - - - - -  05/01/17 2200 113/66 - - 91 20 100 % - -  05/01/17 2101 110/62 97.7 F (36.5 C) Oral 95 (!) 36 99 % - -  05/01/17 2003 - - - - (!) 33 - - -  05/01/17 1951 - 98.8 F (37.1 C) Oral (!) 107 - - - -  05/01/17 1900 114/64 (!) 102.8 F (39.3 C) Oral (!) 132 18 99 % - -    Physical exam: General: Well nourished, well developed female in no acute distress. Abdomen: gravid, nttp Back: No CVAT Cardiovascular: S1, S2 normal, no murmur, rub or gallop, regular rate and rhythm Respiratory: CTAB Extremities: no clubbing, cyanosis or edema Skin: Warm and dry.   Medications: Current Facility-Administered Medications  Medication Dose Route Frequency Provider Last Rate Last Dose  . acetaminophen (TYLENOL) tablet 650 mg  650 mg Oral Q4H PRN Caryl AdaPhelps, Jazma Y, DO      . calcium carbonate (TUMS - dosed in mg elemental calcium) chewable tablet 400 mg of elemental calcium  2 tablet Oral Q4H PRN Caryl AdaPhelps, Jazma Y, DO      . docusate sodium (COLACE) capsule 100 mg  100 mg Oral BID PRN [place] BingPickens, Alyra Patty,  MD      . famotidine (PEPCID) tablet 20 mg  20 mg Oral BID [place] BingPickens, Edvardo Honse, MD      . ferumoxytol [facility](FERAHEME) 510 mg in sodium chloride 0.9 % 100 mL IVPB  510 mg Intravenous Once Lazaro ArmsEure, Luther H, MD      . pantoprazole (PROTONIX) EC tablet 40 mg  40 mg Oral Daily Lazaro ArmsEure, Luther H, MD   40 mg at 05/02/17 1048  . piperacillin-tazobactam (ZOSYN) IVPB 3.375 g  3.375 g Intravenous Q8H Charlaine DaltonGrimsley, Alison L, RPH   Stopped at 05/02/17 [id number]  . prenatal multivitamin tablet 1 tablet  1 tablet Oral Q1200 Caryl AdaPhelps, Jazma Y, DO      . sodium chloride 0.9 % 1,000 mL with potassium chloride 20 mEq infusion   Intravenous Continuous Lazaro ArmsEure, Luther H, MD 125 mL/hr at 05/02/17 0945    . vancomycin (VANCOCIN) IVPB 1000 mg/200 mL premix  1,000 mg Intravenous Q12H Charlaine DaltonGrimsley, Alison L, RPH 200 mL/hr at 05/02/17 1047 1,000 mg at 05/02/17 1047    Labs:  Recent Labs  Lab 05/01/17 1909 05/02/17 0812  WBC 14.3* 12.7*  HGB 9.5* 8.5*  HCT 27.4* 24.3*  PLT 243 210    Recent Labs  Lab 05/01/17 1909  NA 130*  K 2.8*  CL 101  CO2 17*  BUN 10  CREATININE 1.52*  CALCIUM 8.0*  PROT 6.0*  BILITOT 1.7*  ALKPHOS 148*  ALT 14  AST 55*  GLUCOSE 99    BCx x 2: NGTD  Radiology:  EXAM: CHEST  2 VIEW  COMPARISON:  06/03/2015  FINDINGS: Mild cardiomegaly. Lungs are clear. No effusions or acute bony abnormality.  IMPRESSION: Mild cardiomegaly.  No active disease.   Electronically Signed   By: Charlett NoseKevin  Dover M.D.   On: 05/02/2017 08:33  Assessment & Plan:  Pt doing well *Pregnancy: routine care. Category I with accels, okay to come off  monitor. qday NSTs.  *Pyelo: d/c vanc and zosyn and will just do rocephin, which she was susceptible to back in November; she did have a negative test of cure after that asymptomatic bacteruria. Renal u/s if patient worsens but she is doing well. I didn't see a UCx done, so I added this on but unlikely this will grow out anything. F/u BCx *?cardiomegaly: echo done to eval cxr.  Pt asymptomatic.  *AKI: CMP added on to AM labs *Trich: pt given flagyl already. Test of cure one month *Anemia: f/u anemia panel.  *Preterm: no issues *PPx: lovenox ordered, oob ad lib. Zantac ordered *FEN/GI: MIVF, regular diet. Pt is about 2 liters positive. Continue MIVF likely for another 24hrs. She's received about 4-5L of fluid thus far.  *Dispo: pending improving status. Likely HD3-4  Cornelia Copa MD Attending Center for [facility])

## 2017-05-03 DIAGNOSIS — N12 Tubulo-interstitial nephritis, not specified as acute or chronic: Secondary | ICD-10-CM

## 2017-05-03 LAB — C DIFFICILE QUICK SCREEN W PCR REFLEX
C DIFFICILE (CDIFF) INTERP: NOT DETECTED
C DIFFICILE (CDIFF) TOXIN: NEGATIVE
C Diff antigen: NEGATIVE

## 2017-05-03 LAB — CBC WITH DIFFERENTIAL/PLATELET
BASOS ABS: 0 10*3/uL (ref 0.0–0.1)
BASOS PCT: 0 %
Eosinophils Absolute: 0.1 10*3/uL (ref 0.0–0.7)
Eosinophils Relative: 1 %
HEMATOCRIT: 22.4 % — AB (ref 36.0–46.0)
Hemoglobin: 7.8 g/dL — ABNORMAL LOW (ref 12.0–15.0)
Lymphocytes Relative: 17 %
Lymphs Abs: 2.1 10*3/uL (ref 0.7–4.0)
MCH: 30.5 pg (ref 26.0–34.0)
MCHC: 34.8 g/dL (ref 30.0–36.0)
MCV: 87.5 fL (ref 78.0–100.0)
MONO ABS: 1.2 10*3/uL — AB (ref 0.1–1.0)
Monocytes Relative: 10 %
NEUTROS ABS: 8.8 10*3/uL — AB (ref 1.7–7.7)
NEUTROS PCT: 72 %
Platelets: 208 10*3/uL (ref 150–400)
RBC: 2.56 MIL/uL — AB (ref 3.87–5.11)
RDW: 13.1 % (ref 11.5–15.5)
WBC: 12.3 10*3/uL — AB (ref 4.0–10.5)

## 2017-05-03 LAB — BASIC METABOLIC PANEL
ANION GAP: 7 (ref 5–15)
BUN: <5 mg/dL — ABNORMAL LOW (ref 6–20)
CALCIUM: 7.5 mg/dL — AB (ref 8.9–10.3)
CO2: 18 mmol/L — ABNORMAL LOW (ref 22–32)
Chloride: 111 mmol/L (ref 101–111)
Creatinine, Ser: 0.8 mg/dL (ref 0.44–1.00)
GLUCOSE: 89 mg/dL (ref 65–99)
POTASSIUM: 3.3 mmol/L — AB (ref 3.5–5.1)
SODIUM: 136 mmol/L (ref 135–145)

## 2017-05-03 MED ORDER — POTASSIUM CHLORIDE CRYS ER 20 MEQ PO TBCR
30.0000 meq | EXTENDED_RELEASE_TABLET | Freq: Two times a day (BID) | ORAL | Status: DC
  Administered 2017-05-03: 30 meq via ORAL
  Filled 2017-05-03 (×2): qty 1

## 2017-05-03 MED ORDER — CEPHALEXIN 500 MG PO CAPS
500.0000 mg | ORAL_CAPSULE | Freq: Four times a day (QID) | ORAL | Status: DC
  Administered 2017-05-03 – 2017-05-04 (×4): 500 mg via ORAL
  Filled 2017-05-03 (×4): qty 1

## 2017-05-03 MED ORDER — SODIUM CHLORIDE 0.9 % IV SOLN
510.0000 mg | Freq: Once | INTRAVENOUS | Status: AC
  Administered 2017-05-03: 510 mg via INTRAVENOUS
  Filled 2017-05-03: qty 17

## 2017-05-03 NOTE — Progress Notes (Addendum)
Daily Antepartum Note  Admission Date: 05/01/2017 Current Date: 05/03/2017 11:51 AM  Holly Greene is a 22 y.o. G2P1001 @ [redacted]w[redacted]d HD#3, admitted for right sided pyelo, sepsis. Pt also diagnosed with trich and anemia.   Pregnancy complicated by: Patient Active Problem List   Diagnosis Date Noted  . Pyelonephritis 05/02/2017  . Trichimoniasis 05/02/2017  . AKI (acute kidney injury) (HCC) 05/02/2017  . Back pain affecting pregnancy in third trimester 04/26/2017  . Bipolar disorder (HCC) 03/06/2017  . Asthma 03/06/2017  . UTI in pregnancy 03/06/2017  . History of gestational hypertension 03/06/2017  . Encounter for supervision of normal pregnancy, unspecified, unspecified trimester 02/20/2017    Overnight/24hr events:  none  Subjective:  Feeling much better. No more diarrhea, neg ROS. No OB s/s  Objective:    Current Vital Signs 24h Vital Sign Ranges  T 97.6 F (36.4 C) Temp  Avg: 98.8 F (37.1 C)  Min: 97.6 F (36.4 C)  Max: 100.3 F (37.9 C)  BP 110/64 BP  Min: 95/43  Max: 127/71  HR (!) 109 Pulse  Avg: 112.5  Min: 101  Max: 132  RR 20 Resp  Avg: 20.1  Min: 18  Max: 23  SaO2 100 % RA SpO2  Avg: 97.9 %  Min: 96 %  Max: 100 %       24 Hour I/O Current Shift I/O  Time Ins Outs 01/24 0701 - 01/25 0700 In: 3952.5 [P.O.:1440; I.V.:2512.5] Out: 1621 [Urine:1620] 01/25 0701 - 01/25 1900 In: 1182 [P.O.:1182] Out: 850 [Urine:850]   EFM: 135 baseline, +accels, no decel, mod variabilty Toco: quiet  Physical exam: General: Well nourished, well developed female in no acute distress. Abdomen: gravid, nttp Back: No CVAT Cardiovascular: S1, S2 normal, no murmur, rub or gallop, regular rate and rhythm Respiratory: CTAB Extremities: no clubbing, cyanosis or edema Skin: Warm and dry.   Medications: Current Facility-Administered Medications  Medication Dose Route Frequency Provider Last Rate Last Dose  . acetaminophen (TYLENOL) tablet 650 mg  650 mg Oral Q4H PRN Caryl Ada Y,  DO      . calcium carbonate (TUMS - dosed in mg elemental calcium) chewable tablet 400 mg of elemental calcium  2 tablet Oral Q4H PRN Caryl Ada Y, DO      . cefTRIAXone (ROCEPHIN) 2 g in dextrose 5 % 50 mL IVPB  2 g Intravenous Q24H [place] Bing, MD   Stopped at 05/02/17 1444  . docusate sodium (COLACE) capsule 100 mg  100 mg Oral BID PRN Reardan Bing, MD      . enoxaparin (LOVENOX) injection 40 mg  40 mg Subcutaneous Daily [place] Bing, MD   40 mg at 05/03/17 1120  . ferumoxytol (FERAHEME) 510 mg in sodium chloride 0.9 % 100 mL IVPB  510 mg Intravenous Once Bonifay Bing, MD      . ondansetron (ZOFRAN) 8 mg in sodium chloride 0.9 % 50 mL IVPB  8 mg Intravenous Q8H PRN Peters Bing, MD 216 mL/hr at 05/02/17 1322 8 mg at 05/02/17 1322  . pantoprazole (PROTONIX) EC tablet 40 mg  40 mg Oral Daily Lazaro Arms, MD   40 mg at 05/03/17 1120  . potassium chloride (K-DUR,KLOR-CON) CR tablet 30 mEq  30 mEq Oral BID AC Kathrina Crosley, Billey Gosling, MD      . prenatal multivitamin tablet 1 tablet  1 tablet Oral Q1200 Pincus Large, DO   1 tablet at 05/03/17 1120  . sodium chloride 0.9 % 1,000 mL with potassium chloride 20 mEq  infusion   Intravenous Continuous Lazaro ArmsEure, Luther H, MD 125 mL/hr at 05/03/17 0411      Labs:  Recent Labs  Lab 05/01/17 1909 05/02/17 0812 05/03/17 0555  WBC 14.3* 12.7* 12.3*  HGB 9.5* 8.5* 7.8*  HCT 27.4* 24.3* 22.4*  PLT 243 210 208    Recent Labs  Lab 05/01/17 1909 05/02/17 0812 05/03/17 0555  NA 130* 135 136  K 2.8* 3.6 3.3*  CL 101 109 111  CO2 17* 17* 18*  BUN 10 9 <5*  CREATININE 1.52* 1.12* 0.80  CALCIUM 8.0* 7.1* 7.5*  PROT 6.0* 5.1*  --   BILITOT 1.7* 1.1  --   ALKPHOS 148* 123  --   ALT 14 12*  --   AST 55* 47*  --   GLUCOSE 99 96 89   Iron studies c/w iron def anemia  BCx x 2: NGTD  UCx: pending  Radiology:  Maternal echo: negative  Assessment & Plan:  Pt doing well *Pregnancy: routine care. qday NSTs *Pyelo: transition to  keflex qid today. UCx was obtained after she had started the IV abx, so I don't feel that we need to wait on the results to come back prior to d/c if she is clinically improving.  -s/p vanc and zosyn x 1 and rocephin x 1d *?cardiomegaly: neg echo *AKI: resolved *Trich: s/p MAU flagyl. Test of cure one month *Anemia: IV iron x 1 today *Preterm: no issues *PPx: lovenox, oob ad lib. PPI *FEN/GI: reg diet. Supplement electrolytes. Saline lock IV *Dispo: likely tomorrow. Will need to go home on suppression tx after regular treatment course.   Cornelia Copaharlie Nollie Shiflett, Jr. MD Attending Center [facility](Faculty Practice)

## 2017-05-04 MED ORDER — CEPHALEXIN 500 MG PO CAPS: 500.0000 mg | ORAL_CAPSULE | Freq: Four times a day (QID) | ORAL | 0 refills | Status: DC

## 2017-05-04 MED ORDER — NITROFURANTOIN MONOHYD MACRO 100 MG PO CAPS: 100.0000 mg | ORAL_CAPSULE | Freq: Every day | ORAL | 1 refills | Status: DC

## 2017-05-04 NOTE — Discharge Summary (Signed)
OB Discharge Summary     Patient Name: Holly Greene DOB: 10-20-1995 MRN: [id number]  Date of admission: 05/01/2017  Date of discharge: 05/04/2017  Admitting diagnosis: 28WKS UTI Intrauterine pregnancy: [redacted]w[redacted]d     Secondary diagnosis:  Active Problems:   Pyelonephritis   Trichimoniasis   AKI (acute kidney injury) [facility])     Discharge diagnosis: pyelonephritis in third trimester                                   Hospital course:  22 yo G2P1 at [redacted]w[redacted]d admitted with right sided pyelonephritis. Patient started on IV antibiotics. She remained afebrile for over 48 hours and was transitioned to oral antibiotics and remained afebrile. Patient was found stable for discharge. Plan to continue prenatal care outpatient. Patient will receive suppressive therapy following the completion of treatment dosage  FHT: baseline 135, mod variability, +accels, no decels  Physical exam  Vitals:   05/03/17 1600 05/03/17 2019 05/03/17 2320 05/04/17 0557  BP: 97/74 113/62 118/63 (!) 108/51  Pulse: (!) 102 (!) 106 (!) 104 97  Resp: 18 18 18 17   Temp: 98.4 F (36.9 C) (!) 97.4 F (36.3 C) 98.3 F (36.8 C) 97.8 F (36.6 C)  TempSrc: Oral Oral Oral Oral  SpO2: 97% 95% 97% 99%  Weight:      Height:       General: alert, cooperative and no distress Abdomen: soft, gravid, NT DVT Evaluation: No evidence of DVT seen on physical exam. Negative Homan's sign. Labs: Lab Results  Component Value Date   WBC 12.3 (H) 05/03/2017   HGB 7.8 (L) 05/03/2017   HCT 22.4 (L) 05/03/2017   MCV 87.5 05/03/2017   PLT 208 05/03/2017   CMP Latest Ref Rng & Units 05/03/2017  Glucose 65 - 99 mg/dL 89  BUN 6 - 20 mg/dL <5(H)  Creatinine 8.46 - 1.00 mg/dL 9.62  Sodium 952 - 841 mmol/L 136  Potassium 3.5 - 5.1 mmol/L 3.3(L)  Chloride 101 - 111 mmol/L 111  CO2 22 - 32 mmol/L 18(L)  Calcium 8.9 - 10.3 mg/dL 7.5(L)  Total Protein 6.5 - 8.1 g/dL -  Total Bilirubin 0.3 - 1.2 mg/dL -  Alkaline Phos 38 - 324 U/L -  AST  15 - 41 U/L -  ALT 14 - 54 U/L -    Discharge instruction: per After Visit Summary and "Baby and Me Booklet".  After visit meds:  Allergies as of 05/04/2017   No Known Allergies     Medication List    TAKE these medications   acetaminophen 500 MG tablet Commonly known as:  TYLENOL Take 500 mg by mouth every 6 (six) hours as needed for mild pain or headache.   albuterol 108 (90 Base) MCG/ACT inhaler Commonly known as:  PROVENTIL HFA;VENTOLIN HFA Inhale 2 puffs into the lungs every 6 (six) hours as needed for wheezing or shortness of breath.   cephALEXin 500 MG capsule Commonly known as:  KEFLEX Take 1 capsule (500 mg total) by mouth every 6 (six) hours.   cyclobenzaprine 10 MG tablet Commonly known as:  FLEXERIL Take 1 tablet (10 mg total) by mouth 2 (two) times daily as needed for muscle spasms.   metoCLOPramide 10 MG tablet Commonly known as:  REGLAN Take 1 tablet (10 mg total) by mouth 3 (three) times daily before meals.   nitrofurantoin (macrocrystal-monohydrate) 100 MG capsule Commonly known as:  MACROBID Take  1 capsule (100 mg total) by mouth at bedtime.   PRENATE PIXIE 10-0.6-0.4-200 MG Caps Take 1 tablet by mouth daily.   ranitidine 150 MG tablet Commonly known as:  ZANTAC Take 1 tablet (150 mg total) by mouth 2 (two) times daily.   Vitamin D 2000 units Caps Take 1 capsule (2,000 Units total) daily before breakfast by mouth.       Diet: routine diet  Activity: Advance as tolerated. Pelvic rest for 6 weeks.   Outpatient follow up: has been scheduled Follow up Appt:No future appointments. Follow up Visit:No Follow-up on file.    05/04/2017 Holly AntiguaPeggy Zenab Gronewold, MD

## 2017-05-05 LAB — CULTURE, OB URINE: Culture: >=100000 — AB

## 2017-05-06 LAB — CULTURE, BLOOD (ROUTINE X 2)
CULTURE: NO GROWTH
Culture: NO GROWTH
SPECIAL REQUESTS: ADEQUATE
SPECIAL REQUESTS: ADEQUATE

## 2017-05-06 NOTE — H&P (Signed)
HPI: Holly Greene is a 22 y.o. G2P1001 at [redacted]w[redacted]d who presents to maternity admissions via EMS reporting back pain and urine with foul odor. On arrival EMS noted a fever of 102 so the pt was given Tylenol 1000 ml PO prior to arrival in MAU. She reports back pain started 1 week ago and has worsened. She has nausea but no vomiting but reports she has not eaten much in the last 5-6 days because she does not feel well. She had UTI in November 2018 but never picked up her abx. On 04/14/17 she was seen in MAU and had normal UA and urine culture showed multiple species/recollection recommended. On 04/26/17 she presented to MAU with back pain and received Flexeril which helped but she slept most of 24 hours then woke up still in pain. The pain was constant but mild then worsened in the last couple of days. She was not aware of her fever until EMS arrived today. She reports good fetal movement, denies abdominal pain, LOF, vaginal bleeding, vaginal itching/burning, urinary symptoms, h/a,, or dizziness.  HPI  Past Medical History:      Past Medical History:  Diagnosis Date  . Asthma    as a child  . Bipolar 1 disorder (HCC)   . Defiant behavior   . Eczema   . Hypertension    Past obstetric history:                  OB History  Gravida Para Term Preterm AB Living  2 1 1   1   SAB TAB Ectopic Multiple Live Births     0 1    # Outcome Date GA Lbr Len/2nd Weight Sex Delivery Anes PTL Lv  2 Current           1 Term 07/27/16 [redacted]w[redacted]d 08:36 / 01:13 7 lb 9.4 oz (3.442 kg) F Vag-Spont EPI  LIV     Past Surgical History:       Past Surgical History:  Procedure Laterality Date  . TONSILLECTOMY     Family History:       Family History  Problem Relation Age of Onset  . Hypertension Mother    Social History:  Social History        Tobacco Use  . Smoking status: Never Smoker  . Smokeless tobacco: Never Used  Substance Use Topics  . Alcohol use: No  . Drug use: Yes    Types: Marijuana    Comment:  smoked a week ago   Allergies: No Known Allergies  Meds:         Medications Prior to Admission  Medication Sig Dispense Refill Last Dose  . albuterol (PROVENTIL HFA;VENTOLIN HFA) 108 (90 Base) MCG/ACT inhaler Inhale 2 puffs into the lungs every 6 (six) hours as needed for wheezing or shortness of breath. 1 Inhaler 2 Rescue  . Cholecalciferol (VITAMIN D) 2000 units CAPS Take 1 capsule (2,000 Units total) daily before breakfast by mouth. (Patient not taking: Reported on 04/14/2017) 30 capsule 5 Not Taking at Unknown time  . cyclobenzaprine (FLEXERIL) 10 MG tablet Take 1 tablet (10 mg total) by mouth 2 (two) times daily as needed for muscle spasms. 20 tablet 0   . metoCLOPramide (REGLAN) 10 MG tablet Take 1 tablet (10 mg total) by mouth 3 (three) times daily before meals. (Patient not taking: Reported on 04/26/2017) 90 tablet 2 Not Taking at Unknown time  . Prenat-FeAsp-Meth-FA-DHA w/o A (PRENATE PIXIE) 10-0.6-0.4-200 MG CAPS Take 1 tablet by mouth  daily. 30 capsule 12 04/26/2017 at Unknown time  . ranitidine (ZANTAC) 150 MG tablet Take 1 tablet (150 mg total) by mouth 2 (two) times daily. 60 tablet 2    ROS:  Review of Systems  Constitutional: Positive for diaphoresis and fever. Negative for chills and fatigue.  HENT: Negative for congestion and rhinorrhea.  Eyes: Negative for visual disturbance.  Respiratory: Negative for cough and shortness of breath.  Cardiovascular: Negative for chest pain.  Gastrointestinal: Negative for abdominal pain and vomiting.  Genitourinary: Negative for difficulty urinating, dysuria, flank pain, pelvic pain, vaginal bleeding, vaginal discharge and vaginal pain.  Neurological: Negative for dizziness and headaches.  Psychiatric/Behavioral: Negative.   I have reviewed patient's Past Medical Hx, Surgical Hx, Family Hx, Social Hx, medications and allergies.  Physical Exam  Patient Vitals for the past 24 hrs:   BP Temp Temp src Pulse Resp SpO2  05/01/17 2003 - - - -  (!) 33 -  05/01/17 1951 - 98.8 F (37.1 C) Oral (!) 107 - -  05/01/17 1900 114/64 (!) 102.8 F (39.3 C) Oral (!) 132 18 99 %   Constitutional: Well-developed, well-nourished female in no acute distress.  Cardiovascular: normal rate  Respiratory: normal effort  GI: Abd soft, non-tender, gravid appropriate for gestational age.  MS: Extremities nontender, no edema, normal ROM  Neurologic: Alert and oriented x 4.  GU: Neg CVAT.  PELVIC EXAM: Cervix pink, visually closed, without lesion, scant white creamy discharge, vaginal walls and external genitalia normal  Bimanual exam: Cervix 0/long/high, firm, anterior, neg CMT, uterus nontender, nonenlarged, adnexa without tenderness, enlargement, or mass   FHT: On arrival FHR baseline 175 with late decel x 1. After 1000 ml IV fluids, baseline 150 , moderate variability, accelerations present, no decelerations  Contractions: Q 30 minutes, mild to palpation  Labs:  Lab Results Last 24 Hours  O/Positive/-- (11/14 1426)  Imaging:  Imaging Results     MAU Course/MDM:  Sepsis labs ordered on arrival with CBC with diff, CMP, lactic acid, and blood cultures x 2  NST reviewed and reactive after fluid bolus  I&O cath for urine, pt unable to urinate  Pt with recent history of UTI so likely pyelonephritis, no other known source of fever  Hypokalemia with K of 2.8  Consult Dr Despina HiddenEure with presentation, exam findings and test results.  Initiate sepsis protocol/focused orders  NS bolus, replace K in IV fluids  Admit to HROB Unit  Assessment:  1. Pyelonephritis affecting pregnancy in second trimester    2. Back pain affecting pregnancy in third trimester    Plan:  Admit to HROB Unit  Lactic acid 2.0 so repeat in 6 hours  Fluid bolus continues, 3,000 ml based on protocol and pt weight  Continuous EFM/toco  Sharen CounterLisa Leftwich-Kirby  Certified Nurse-Midwife  05/01/2017  8:56 PM  Lazaro ArmsLuther H Vallerie Hentz, MD 05/06/2017 7:18 AM Attestation of Attending Supervision of Advanced Practitioner (CNM/NP/PA): Evaluation and management procedures were performed by the Advanced Practitioner under my supervision and collaboration. I have reviewed the Advanced Practitioner's note and chart, and I agree with the management and plan.  Rockne CoonsLuther H Darek Eifler Jr MD Attending Physician for the [facility] 05/06/2017 7:18 AM

## 2017-05-09 ENCOUNTER — Encounter: Payer: Self-pay | Admitting: Obstetrics

## 2017-05-09 ENCOUNTER — Other Ambulatory Visit: Payer: Medicaid Other

## 2017-05-20 ENCOUNTER — Inpatient Hospital Stay (HOSPITAL_COMMUNITY)
Admission: AD | Admit: 2017-05-20 | Discharge: 2017-05-20 | Disposition: A | Discharge status: Home / Self Care | Payer: Medicaid Other | Source: Ambulatory Visit | Attending: Obstetrics and Gynecology | Admitting: Obstetrics and Gynecology

## 2017-05-20 ENCOUNTER — Encounter (HOSPITAL_COMMUNITY): Payer: Self-pay | Admitting: *Deleted

## 2017-05-20 ENCOUNTER — Other Ambulatory Visit: Payer: Self-pay

## 2017-05-20 DIAGNOSIS — O99891 Other specified diseases and conditions complicating pregnancy: Secondary | ICD-10-CM

## 2017-05-20 DIAGNOSIS — O26893 Other specified pregnancy related conditions, third trimester: Secondary | ICD-10-CM | POA: Insufficient documentation

## 2017-05-20 DIAGNOSIS — O2343 Unspecified infection of urinary tract in pregnancy, third trimester: Secondary | ICD-10-CM | POA: Diagnosis not present

## 2017-05-20 DIAGNOSIS — O26853 Spotting complicating pregnancy, third trimester: Secondary | ICD-10-CM

## 2017-05-20 DIAGNOSIS — O9989 Other specified diseases and conditions complicating pregnancy, childbirth and the puerperium: Secondary | ICD-10-CM

## 2017-05-20 DIAGNOSIS — O26823 Pregnancy related peripheral neuritis, third trimester: Secondary | ICD-10-CM | POA: Diagnosis not present

## 2017-05-20 DIAGNOSIS — Z3A29 29 weeks gestation of pregnancy: Secondary | ICD-10-CM

## 2017-05-20 DIAGNOSIS — M549 Dorsalgia, unspecified: Secondary | ICD-10-CM | POA: Diagnosis not present

## 2017-05-20 DIAGNOSIS — O4693 Antepartum hemorrhage, unspecified, third trimester: Secondary | ICD-10-CM | POA: Diagnosis present

## 2017-05-20 LAB — WET PREP, GENITAL
Clue Cells Wet Prep HPF POC: NONE SEEN
Sperm: NONE SEEN
Trich, Wet Prep: NONE SEEN
Yeast Wet Prep HPF POC: NONE SEEN

## 2017-05-20 LAB — URINALYSIS, ROUTINE W REFLEX MICROSCOPIC
Bilirubin Urine: NEGATIVE
Glucose, UA: NEGATIVE mg/dL
Hgb urine dipstick: NEGATIVE
Ketones, ur: 5 mg/dL — AB
Nitrite: NEGATIVE
Protein, ur: 30 mg/dL — AB
SPECIFIC GRAVITY, URINE: 1.008 (ref 1.005–1.030)
pH: 7 (ref 5.0–8.0)

## 2017-05-20 MED ORDER — NITROFURANTOIN MONOHYD MACRO 100 MG PO CAPS: 100.0000 mg | ORAL_CAPSULE | Freq: Every day | ORAL | 2 refills | Status: DC

## 2017-05-20 NOTE — MAU Provider Note (Signed)
Chief Complaint:  Vaginal Bleeding and Abdominal Pain   None     HPI: Holly Greene is a 22 y.o. G2P1001 at 7972w3d with recent hx significant for pyelonephritis with admission 05/01/17, who presents to maternity admissions reporting some mild cramping and a small blood clot when wiping around 11 AM this morning.  The bleeding resolved after this episode. She denies any pain with urination, copious or purulent vaginal discharge, or any contractions at this time.  She reports vaginal intercourse last Friday.  She reports good fetal movement, denies LOF, vaginal itching/burning, urinary symptoms, h/a, dizziness, n/v, or fever/chills.    Of note, she let us know that she finished the majority of her keflex prescription from her previous admission but only took the macrobid for 3 or 4 days because it made her nauseous at night.  She currently is not on any maintenance therapy for her recurrent UTIs.     Past Medical History: Past Medical History:  Diagnosis Date  . Asthma    as a child  . Bipolar 1 disorder (HCC)   . Defiant behavior   . Eczema   . Hypertension     Past obstetric history: OB History  Gravida Para Term Preterm AB Living  2 1 1     1   SAB TAB Ectopic Multiple Live Births        0 1    # Outcome Date GA Lbr Len/2nd Weight Sex Delivery Anes PTL Lv  2 Current           1 Term 07/27/16 5174w4d 08:36 / 01:13 7 lb 9.4 oz (3.442 kg) F Vag-Spont EPI  LIV      Past Surgical History: Past Surgical History:  Procedure Laterality Date  . TONSILLECTOMY      Family History: Family History  Problem Relation Age of Onset  . Hypertension Mother     Social History: Social History   Tobacco Use  . Smoking status: Never Smoker  . Smokeless tobacco: Never Used  Substance Use Topics  . Alcohol use: No  . Drug use: No    Comment: smoked a week ago    Allergies: No Known Allergies  Meds:  No medications prior to admission.    ROS:  Review of Systems  Constitutional:  Negative for chills, fatigue and fever.  Eyes: Negative for visual disturbance.  Respiratory: Negative for shortness of breath.   Cardiovascular: Negative for chest pain.  Gastrointestinal: Positive for abdominal pain. Negative for nausea and vomiting.  Genitourinary: Positive for vaginal bleeding. Negative for difficulty urinating, dysuria, flank pain, pelvic pain, vaginal discharge and vaginal pain.  Neurological: Negative for dizziness and headaches.  Psychiatric/Behavioral: Negative.      I have reviewed patient's Past Medical Hx, Surgical Hx, Family Hx, Social Hx, medications and allergies.   Physical Exam   Patient Vitals for the past 24 hrs:  BP Temp Temp src Pulse Resp Height  05/20/17 1349 120/66 98.1 F (36.7 C) Oral 74 - -  05/20/17 1153 134/79 98.5 F (36.9 C) Oral 92 17 5\' 7"  (1.702 m)   Constitutional: Well-developed, well-nourished female in no acute distress.  Cardiovascular: normal rate Respiratory: normal effort GI: Abd soft, non-tender, gravid appropriate for gestational age.  MS: Extremities nontender, no edema, normal ROM Neurologic: Alert and oriented x 4.  GU: Neg CVAT.  PELVIC EXAM: Cervix pink, visually closed, without lesion, scant white creamy discharge, no blood vaginal walls and external genitalia normal Bimanual exam: Cervix 0/long/high, firm, anterior, neg  CMT, uterus nontender, nonenlarged, adnexa without tenderness, enlargement, or mass  Dilation: Fingertip Effacement (%): Thick Cervical Position: Posterior Presentation: Vertex Exam by:: Sharen Counter, CNM  FHT:  Baseline 145 , moderate variability,  Accelerations present, no decelerations Contractions: none at this time    Labs: Results for orders placed or performed during the hospital encounter of 05/20/17 (from the past 24 hour(s))  Urinalysis, Routine w reflex microscopic     Status: Abnormal   Collection Time: 05/20/17 12:40 PM  Result Value Ref Range   Color, Urine YELLOW  YELLOW   APPearance HAZY (A) CLEAR   Specific Gravity, Urine 1.008 1.005 - 1.030   pH 7.0 5.0 - 8.0   Glucose, UA NEGATIVE NEGATIVE mg/dL   Hgb urine dipstick NEGATIVE NEGATIVE   Bilirubin Urine NEGATIVE NEGATIVE   Ketones, ur 5 (A) NEGATIVE mg/dL   Protein, ur 30 (A) NEGATIVE mg/dL   Nitrite NEGATIVE NEGATIVE   Leukocytes, UA LARGE (A) NEGATIVE   RBC / HPF 0-5 0 - 5 RBC/hpf   WBC, UA 6-30 0 - 5 WBC/hpf   Bacteria, UA FEW (A) NONE SEEN   Squamous Epithelial / LPF 6-30 (A) NONE SEEN   Mucus PRESENT    Granular Casts, UA PRESENT   Wet prep, genital     Status: Abnormal   Collection Time: 05/20/17  2:25 PM  Result Value Ref Range   Yeast Wet Prep HPF POC NONE SEEN NONE SEEN   Trich, Wet Prep NONE SEEN NONE SEEN   Clue Cells Wet Prep HPF POC NONE SEEN NONE SEEN   WBC, Wet Prep HPF POC MANY (A) NONE SEEN   Sperm NONE SEEN    --/--/O POS (01/23 2046)  Imaging:    MAU Course/MDM: Patient history obtained and physical exam performed   labs ordered and results reviewed. UA was not diagnostic for UTI but with large leukocytes.  Wet prep showed many WBCs but no other abnormalities.  Culture pending.  No blood noted on pelvic exam today. NST reviewed and reactive Pt to take her daily maintenance abx, Macrobid 100 mg, r/t frequent UTI/pyelo Urine sent for culture Pt discharge with strict return precautions if she experienced vaginal bleeding/abdominal pain  Today's evaluation included a work-up for preterm labor which can be life-threatening for both mom and baby.  Assessment:  1. Urinary tract infection in mother during third trimester of pregnancy   2. Back pain affecting pregnancy in third trimester     Plan: Discharge home Labor precautions and fetal kick counts Follow-up Information    [facility] Follow up.   Why:  As scheduled, return to MAU as needed for emergencies Contact information: [address] [place]  [address] [phone]          Clare Charon, MS3 05/20/2017 4:15 PM   I confirm that I have verified the information documented in the medical student's note and that I have also personally performed the physical exam and all medical decision making activities.  Sharen Counter, CNM 4:23 PM

## 2017-05-20 NOTE — MAU Note (Signed)
Pt. Arrived via EMS. Pt. Experience abd. cramping first, went to BR void, and a clot came out with vaginal bleeding, then urine. Pt. Denies abd. trauma.  Positive for fetal movement, denies sudden gush of fluid. Upon arrival EFM applied - FHR 150s, Toco applied - abd. Soft. Upon vaginal inspection, no vaginal bleeding noted.

## 2017-05-21 LAB — CULTURE, OB URINE

## 2017-06-03 ENCOUNTER — Inpatient Hospital Stay (HOSPITAL_COMMUNITY): Payer: Medicaid Other

## 2017-06-03 ENCOUNTER — Inpatient Hospital Stay (HOSPITAL_COMMUNITY)
Admission: AD | Admit: 2017-06-03 | Discharge: 2017-06-04 | Disposition: A | Discharge status: Home / Self Care | Payer: Medicaid Other | Source: Ambulatory Visit | Attending: Obstetrics and Gynecology | Admitting: Obstetrics and Gynecology

## 2017-06-03 ENCOUNTER — Encounter (HOSPITAL_COMMUNITY): Payer: Self-pay

## 2017-06-03 ENCOUNTER — Other Ambulatory Visit: Payer: Self-pay

## 2017-06-03 DIAGNOSIS — J45909 Unspecified asthma, uncomplicated: Secondary | ICD-10-CM | POA: Diagnosis not present

## 2017-06-03 DIAGNOSIS — O99343 Other mental disorders complicating pregnancy, third trimester: Secondary | ICD-10-CM | POA: Diagnosis not present

## 2017-06-03 DIAGNOSIS — O09893 Supervision of other high risk pregnancies, third trimester: Secondary | ICD-10-CM | POA: Insufficient documentation

## 2017-06-03 DIAGNOSIS — Z79899 Other long term (current) drug therapy: Secondary | ICD-10-CM | POA: Insufficient documentation

## 2017-06-03 DIAGNOSIS — F319 Bipolar disorder, unspecified: Secondary | ICD-10-CM | POA: Insufficient documentation

## 2017-06-03 DIAGNOSIS — O212 Late vomiting of pregnancy: Secondary | ICD-10-CM | POA: Diagnosis not present

## 2017-06-03 DIAGNOSIS — Z3A31 31 weeks gestation of pregnancy: Secondary | ICD-10-CM | POA: Diagnosis not present

## 2017-06-03 DIAGNOSIS — O219 Vomiting of pregnancy, unspecified: Secondary | ICD-10-CM

## 2017-06-03 DIAGNOSIS — R05 Cough: Secondary | ICD-10-CM

## 2017-06-03 DIAGNOSIS — R059 Cough, unspecified: Secondary | ICD-10-CM

## 2017-06-03 DIAGNOSIS — O163 Unspecified maternal hypertension, third trimester: Secondary | ICD-10-CM | POA: Diagnosis not present

## 2017-06-03 DIAGNOSIS — O368390 Maternal care for abnormalities of the fetal heart rate or rhythm, unspecified trimester, not applicable or unspecified: Secondary | ICD-10-CM | POA: Diagnosis not present

## 2017-06-03 DIAGNOSIS — Z3A35 35 weeks gestation of pregnancy: Secondary | ICD-10-CM | POA: Diagnosis present

## 2017-06-03 DIAGNOSIS — O99513 Diseases of the respiratory system complicating pregnancy, third trimester: Secondary | ICD-10-CM | POA: Diagnosis not present

## 2017-06-03 LAB — COMPREHENSIVE METABOLIC PANEL
ALBUMIN: 2.6 g/dL — AB (ref 3.5–5.0)
ALT: 8 U/L — AB (ref 14–54)
AST: 31 U/L (ref 15–41)
Alkaline Phosphatase: 108 U/L (ref 38–126)
Anion gap: 7 (ref 5–15)
BILIRUBIN TOTAL: 0.7 mg/dL (ref 0.3–1.2)
CHLORIDE: 104 mmol/L (ref 101–111)
CO2: 24 mmol/L (ref 22–32)
CREATININE: 0.61 mg/dL (ref 0.44–1.00)
Calcium: 8.2 mg/dL — ABNORMAL LOW (ref 8.9–10.3)
GFR calc Af Amer: >60 mL/min (ref >60)
GFR calc non Af Amer: >60 mL/min (ref >60)
GLUCOSE: 211 mg/dL — AB (ref 65–99)
POTASSIUM: 3.5 mmol/L (ref 3.5–5.1)
Sodium: 135 mmol/L (ref 135–145)
TOTAL PROTEIN: 6 g/dL — AB (ref 6.5–8.1)

## 2017-06-03 LAB — INFLUENZA PANEL BY PCR (TYPE A & B)
Influenza A By PCR: NEGATIVE
Influenza B By PCR: NEGATIVE

## 2017-06-03 LAB — URINALYSIS, ROUTINE W REFLEX MICROSCOPIC
Bilirubin Urine: NEGATIVE
Glucose, UA: NEGATIVE mg/dL
HGB URINE DIPSTICK: NEGATIVE
Ketones, ur: 80 mg/dL — AB
NITRITE: NEGATIVE
Protein, ur: NEGATIVE mg/dL
SPECIFIC GRAVITY, URINE: 1.009 (ref 1.005–1.030)
pH: 7 (ref 5.0–8.0)

## 2017-06-03 LAB — CBC
HEMATOCRIT: 31.2 % — AB (ref 36.0–46.0)
Hemoglobin: 10.5 g/dL — ABNORMAL LOW (ref 12.0–15.0)
MCH: 31.2 pg (ref 26.0–34.0)
MCHC: 33.7 g/dL (ref 30.0–36.0)
MCV: 92.6 fL (ref 78.0–100.0)
Platelets: 220 10*3/uL (ref 150–400)
RBC: 3.37 MIL/uL — AB (ref 3.87–5.11)
RDW: 15.4 % (ref 11.5–15.5)
WBC: 7 10*3/uL (ref 4.0–10.5)

## 2017-06-03 LAB — PROTEIN / CREATININE RATIO, URINE
Creatinine, Urine: 142 mg/dL
Protein Creatinine Ratio: 0.13 mg/mg{Cre} (ref 0.00–0.15)
Total Protein, Urine: 18 mg/dL

## 2017-06-03 LAB — GLUCOSE, CAPILLARY: Glucose-Capillary: 90 mg/dL (ref 65–99)

## 2017-06-03 MED ORDER — LACTATED RINGERS IV SOLN
INTRAVENOUS | Status: DC
  Administered 2017-06-03: 23:00:00 via INTRAVENOUS

## 2017-06-03 MED ORDER — ONDANSETRON HCL 4 MG/2ML IJ SOLN
4.0000 mg | Freq: Once | INTRAMUSCULAR | Status: AC
  Administered 2017-06-03: 4 mg via INTRAVENOUS
  Filled 2017-06-03: qty 2

## 2017-06-03 MED ORDER — PROMETHAZINE HCL 25 MG/ML IJ SOLN
25.0000 mg | Freq: Once | INTRAVENOUS | Status: AC
  Administered 2017-06-03: 25 mg via INTRAVENOUS
  Filled 2017-06-03: qty 1

## 2017-06-03 MED ORDER — M.V.I. ADULT IV INJ
Freq: Once | INTRAVENOUS | Status: AC
  Administered 2017-06-03: 19:00:00 via INTRAVENOUS
  Filled 2017-06-03: qty 1000

## 2017-06-03 MED ORDER — LACTATED RINGERS IV BOLUS (SEPSIS)
1000.0000 mL | Freq: Once | INTRAVENOUS | Status: AC
  Administered 2017-06-03: 1000 mL via INTRAVENOUS

## 2017-06-03 MED ORDER — FAMOTIDINE IN NACL 20-0.9 MG/50ML-% IV SOLN
20.0000 mg | Freq: Once | INTRAVENOUS | Status: AC
  Administered 2017-06-03: 20 mg via INTRAVENOUS
  Filled 2017-06-03: qty 50

## 2017-06-03 NOTE — MAU Note (Signed)
Report from EMS: vomiting x3 days, not nauseated, feels better after she throws up. 7months preg with 2nd child.  BP and pulse elevated. BS was 94.

## 2017-06-03 NOTE — MAU Note (Signed)
Notified CNM of absent variability.

## 2017-06-03 NOTE — MAU Note (Signed)
Notified MAU provider Aundria Rud(Rogers, CNM) of decreasing variability. BPP ordered.

## 2017-06-03 NOTE — MAU Provider Note (Addendum)
History     CSN: [id number]  Arrival date and time: 06/03/17 1712   First Provider Initiated Contact with Patient 06/03/17 1758      Chief Complaint  Patient presents with  . Emesis   HPI  Ms.  Holly Greene is a 22 y.o. year old G36P1001 female at [redacted]w[redacted]d weeks gestation who presents to MAU via EMS reporting N/V "unable to keep anything down since 1100 today".  She has tried to keep herself hydrated by drinking juice, but has continued to throw up. Per EMS her BP and pulse rate are elevated and her BS was 94.  Past Medical History:  Diagnosis Date  . Asthma    as a child  . Bipolar 1 disorder (HCC)   . Defiant behavior   . Eczema   . Hypertension     Past Surgical History:  Procedure Laterality Date  . TONSILLECTOMY      Family History  Problem Relation Age of Onset  . Hypertension Mother     Social History   Tobacco Use  . Smoking status: Never Smoker  . Smokeless tobacco: Never Used  Substance Use Topics  . Alcohol use: No  . Drug use: No    Comment: smoked a week ago    Allergies: No Known Allergies  Medications Prior to Admission  Medication Sig Dispense Refill Last Dose  . albuterol (PROVENTIL HFA;VENTOLIN HFA) 108 (90 Base) MCG/ACT inhaler Inhale 2 puffs into the lungs every 6 (six) hours as needed for wheezing or shortness of breath. 1 Inhaler 2 Rescue  . cephALEXin (KEFLEX) 500 MG capsule Take 1 capsule (500 mg total) by mouth every 6 (six) hours. 36 capsule 0 Past Month at Unknown time  . nitrofurantoin, macrocrystal-monohydrate, (MACROBID) 100 MG capsule Take 1 capsule (100 mg total) by mouth daily. 30 capsule 2   . Prenat-FeAsp-Meth-FA-DHA w/o A (PRENATE PIXIE) 10-0.6-0.4-200 MG CAPS Take 1 tablet by mouth daily. 30 capsule 12 05/19/2017 at Unknown time  . ranitidine (ZANTAC) 150 MG tablet Take 1 tablet (150 mg total) by mouth 2 (two) times daily. (Patient not taking: Reported on 05/20/2017) 60 tablet 2 Not Taking at Unknown time    Review of  Systems  Constitutional: Positive for fatigue.  HENT: Negative.   Eyes: Negative.   Respiratory: Negative.   Cardiovascular: Negative.   Gastrointestinal: Positive for nausea and vomiting.  Endocrine: Negative.   Genitourinary: Positive for pelvic pain (occ UC's).  Musculoskeletal: Negative.   Skin: Negative.   Allergic/Immunologic: Negative.   Neurological: Negative.   Hematological: Negative.   Psychiatric/Behavioral: Negative.    Physical Exam   Patient Vitals for the past 24 hrs:  BP Temp Temp src Pulse Resp  06/03/17 1946 128/75 - - (!) 111 -  06/03/17 1931 119/74 - - (!) 104 -  06/03/17 1926 - 98.9 F (37.2 C) Oral - -  06/03/17 1916 121/65 - - 99 -  06/03/17 1901 130/72 - - 89 -  06/03/17 1846 (!) 140/110 - - 95 -  06/03/17 1831 137/86 - - 87 -  06/03/17 1801 140/82 - - 95 -  06/03/17 1746 (!) 140/91 - - 93 -  06/03/17 1725 (!) 144/90 100 F (37.8 C) Oral 99 18    Physical Exam  Nursing note and vitals reviewed. Constitutional: She is oriented to person, place, and time. She appears well-developed and well-nourished.  HENT:  Head: Normocephalic and atraumatic.  Eyes: Pupils are equal, round, and reactive to light.  Neck: Normal range of  motion.  Cardiovascular: Normal rate, regular rhythm and normal heart sounds.  Respiratory: Effort normal and breath sounds normal.  GI: Soft. Bowel sounds are normal.  Musculoskeletal: Normal range of motion.  Neurological: She is alert and oriented to person, place, and time.  Skin: Skin is warm and dry.  Psychiatric: She has a normal mood and affect. Her behavior is normal. Judgment and thought content normal.    MAU Course  Procedures  MDM Cath UA Phenergan 25 mg in D5LR 1000 ml @ 999 ml/hr MVI in LR 1000 ml @ 500 ml/hr  Results for orders placed or performed during the hospital encounter of 06/03/17 (from the past 24 hour(s))  Urinalysis, Routine w reflex microscopic     Status: Abnormal   Collection Time:  06/03/17  5:51 PM  Result Value Ref Range   Color, Urine YELLOW YELLOW   APPearance CLEAR CLEAR   Specific Gravity, Urine 1.009 1.005 - 1.030   pH 7.0 5.0 - 8.0   Glucose, UA NEGATIVE NEGATIVE mg/dL   Hgb urine dipstick NEGATIVE NEGATIVE   Bilirubin Urine NEGATIVE NEGATIVE   Ketones, ur 80 (A) NEGATIVE mg/dL   Protein, ur NEGATIVE NEGATIVE mg/dL   Nitrite NEGATIVE NEGATIVE   Leukocytes, UA MODERATE (A) NEGATIVE   RBC / HPF 0-5 0 - 5 RBC/hpf   WBC, UA 0-5 0 - 5 WBC/hpf   Bacteria, UA RARE (A) NONE SEEN   Squamous Epithelial / LPF 0-5 (A) NONE SEEN   Mucus PRESENT   Protein / creatinine ratio, urine     Status: None   Collection Time: 06/03/17  5:51 PM  Result Value Ref Range   Creatinine, Urine 142.00 mg/dL   Total Protein, Urine 18 mg/dL   Protein Creatinine Ratio 0.13 0.00 - 0.15 mg/mg[Cre]  CBC     Status: Abnormal   Collection Time: 06/03/17  7:02 PM  Result Value Ref Range   WBC 7.0 4.0 - 10.5 K/uL   RBC 3.37 (L) 3.87 - 5.11 MIL/uL   Hemoglobin 10.5 (L) 12.0 - 15.0 g/dL   HCT 16.1 (L) 09.6 - 04.5 %   MCV 92.6 78.0 - 100.0 fL   MCH 31.2 26.0 - 34.0 pg   MCHC 33.7 30.0 - 36.0 g/dL   RDW 40.9 81.1 - 91.4 %   Platelets 220 150 - 400 K/uL  Comprehensive metabolic panel     Status: Abnormal   Collection Time: 06/03/17  7:02 PM  Result Value Ref Range   Sodium 135 135 - 145 mmol/L   Potassium 3.5 3.5 - 5.1 mmol/L   Chloride 104 101 - 111 mmol/L   CO2 24 22 - 32 mmol/L   Glucose, Bld 211 (H) 65 - 99 mg/dL   BUN <5 (L) 6 - 20 mg/dL   Creatinine, Ser 7.82 0.44 - 1.00 mg/dL   Calcium 8.2 (L) 8.9 - 10.3 mg/dL   Total Protein 6.0 (L) 6.5 - 8.1 g/dL   Albumin 2.6 (L) 3.5 - 5.0 g/dL   AST 31 15 - 41 U/L   ALT 8 (L) 14 - 54 U/L   Alkaline Phosphatase 108 38 - 126 U/L   Total Bilirubin 0.7 0.3 - 1.2 mg/dL   GFR calc non Af Amer >60 >60 mL/min   GFR calc Af Amer >60 >60 mL/min   Anion gap 7 5 - 15  Influenza panel by PCR (type A & B)     Status: None   Collection Time:  06/03/17  8:45 PM  Result Value Ref Range   Influenza A By PCR NEGATIVE NEGATIVE   Influenza B By PCR NEGATIVE NEGATIVE  Glucose, capillary     Status: None   Collection Time: 06/03/17 11:14 PM  Result Value Ref Range   Glucose-Capillary 90 65 - 99 mg/dL    Report given to and care assumed by Steward DroneVeronica Cohen Boettner, CNM  Raelyn Moraolitta Dawson, MSN, CNM 06/03/2017, 5:58 PM   Viral infection of flu r/o: Influenza negative  CBG: 90 at recheck, done from 211 on labs  Urine Culture- pending  LR 1,05700mL bolus @ 94599mL/hr  4mg  Zofran IV   Fetal tachycardia with absent-minimal variability noted on tracing, BPP ordered   US BPP w/o NST completed in MAU:  BPP 8/8  AFI sum 25.26cm FHR 164 Presentation cephalic   C/w Dr Vergie LivingPickens with assessment, FHR and US results. Recommends extended monitoring and discharge after reactive tracing of 2 hours.   LR 1,08100mL @ 15925ml/hr during monitoring  Prolonged monitoring in MAU done prior to discharge. Reactive tracing with no fetal tachycardia for 2 hours prior to discharge.   Assessment and Plan   1. Nausea and vomiting during pregnancy   2. Cough   3.      Reactive NST   Discharged home. Pt stable at time of discharge.  Follow up as scheduled in the office today for prenatal appointment  Return to MAU as needed for emergencies  Rx for Zofran sent to pharmacy of choice for N/V during pregnancy   Follow-up Information    [facility] Go on [facility].   Specialty:  Obstetrics and Gynecology Why:  Follow up as scheduled for appointment today for prenatal care Contact information: [address], [address] [place] [id number] [phone]         Allergies as of 06/04/2017   No Known Allergies     Medication List    TAKE these medications   albuterol 108 (90 Base) MCG/ACT inhaler Commonly known as:  PROVENTIL HFA;VENTOLIN HFA Inhale 2 puffs into the lungs every 6 (six) hours as needed for wheezing or  shortness of breath.   cephALEXin 500 MG capsule Commonly known as:  KEFLEX Take 1 capsule (500 mg total) by mouth every 6 (six) hours.   nitrofurantoin (macrocrystal-monohydrate) 100 MG capsule Commonly known as:  MACROBID Take 1 capsule (100 mg total) by mouth daily.   ondansetron 4 MG tablet Commonly known as:  ZOFRAN Take 1 tablet (4 mg total) by mouth every 6 (six) hours as needed for nausea or vomiting.   PRENATE PIXIE 10-0.6-0.4-200 MG Caps Take 1 tablet by mouth daily.   ranitidine 150 MG tablet Commonly known as:  ZANTAC Take 1 tablet (150 mg total) by mouth 2 (two) times daily.       Sharyon CableVeronica C Bayron Dalto, CNM 06/04/17, 1:53 AM

## 2017-06-04 ENCOUNTER — Encounter: Payer: Self-pay | Admitting: Certified Nurse Midwife

## 2017-06-04 DIAGNOSIS — O212 Late vomiting of pregnancy: Secondary | ICD-10-CM | POA: Diagnosis not present

## 2017-06-04 DIAGNOSIS — R05 Cough: Secondary | ICD-10-CM

## 2017-06-04 DIAGNOSIS — Z3A31 31 weeks gestation of pregnancy: Secondary | ICD-10-CM | POA: Diagnosis not present

## 2017-06-04 MED ORDER — ONDANSETRON HCL 4 MG PO TABS: 4.0000 mg | ORAL_TABLET | Freq: Four times a day (QID) | ORAL | 2 refills | Status: DC | PRN

## 2017-06-05 LAB — CULTURE, OB URINE: Culture: NO GROWTH

## 2017-06-20 ENCOUNTER — Encounter: Payer: Self-pay | Admitting: Certified Nurse Midwife

## 2017-06-20 ENCOUNTER — Ambulatory Visit (INDEPENDENT_AMBULATORY_CARE_PROVIDER_SITE_OTHER): Payer: Medicaid Other | Admitting: Certified Nurse Midwife

## 2017-06-20 VITALS — BP 133/87 | HR 94 | Wt 201.8 lb

## 2017-06-20 DIAGNOSIS — Z8759 Personal history of other complications of pregnancy, childbirth and the puerperium: Secondary | ICD-10-CM

## 2017-06-20 DIAGNOSIS — Z3483 Encounter for supervision of other normal pregnancy, third trimester: Secondary | ICD-10-CM

## 2017-06-20 DIAGNOSIS — O3660X0 Maternal care for excessive fetal growth, unspecified trimester, not applicable or unspecified: Secondary | ICD-10-CM

## 2017-06-20 NOTE — Progress Notes (Signed)
Pt states ctx 2 x day for the past couple days Pt has had recent visits to MAU. Pt has not been seen in office for since initial Prenatal. Pt needs glucose testing.

## 2017-06-20 NOTE — Patient Instructions (Signed)

## 2017-06-20 NOTE — Progress Notes (Signed)
   PRENATAL VISIT NOTE  Subjective:  Holly Greene is a 22 y.o. G2P1001 at 5157w6d being seen today for ongoing prenatal care.  She is currently monitored for the following issues for this low-risk pregnancy and has Encounter for supervision of normal pregnancy, unspecified, unspecified trimester; Bipolar disorder (HCC); Asthma; UTI in pregnancy; History of gestational hypertension; Back pain affecting pregnancy in third trimester; Pyelonephritis; Trichimoniasis; and AKI (acute kidney injury) (HCC) on their problem list.  Patient reports no complaints.  Contractions: Irregular. Vag. Bleeding: None.  Movement: Present. Denies leaking of fluid.   The following portions of the patient's history were reviewed and updated as appropriate: allergies, current medications, past family history, past medical history, past social history, past surgical history and problem list. Problem list updated.  Objective:   Vitals:   06/20/17 1427  BP: 133/87  Pulse: 94  Weight: 201 lb 12.8 oz (91.5 kg)    Fetal Status: Fetal Heart Rate (bpm): 155 Fundal Height: 37 cm Movement: Present     General:  Alert, oriented and cooperative. Patient is in no acute distress.  Skin: Skin is warm and dry. No rash noted.   Cardiovascular: Normal heart rate noted  Respiratory: Normal respiratory effort, no problems with respiration noted  Abdomen: Soft, gravid, appropriate for gestational age.  Pain/Pressure: Absent     Pelvic: Cervical exam deferred        Extremities: Normal range of motion.  Edema: None  Mental Status:  Normal mood and affect. Normal behavior. Normal judgment and thought content.   Assessment and Plan:  Pregnancy: G2P1001 at 5257w6d  1. Encounter for supervision of other normal pregnancy in third trimester -Pt doing well, no complaints. Discussed reason for not being seen for prenatal appointments since initial prenatal. Pt reports being "homeless [facility]".  Educated on the importance of being seen for care with history of gestational hypertension in last pregnancy. Pt agrees to POC.  - CBC - HIV antibody - RPR - Glucose Tolerance, 2 Hours w/1 Hour; Future (ride has to go to work, will return in morning for testing)   2. History of gestational hypertension -BP stable at this time, baseline labs drawn in MAU on 2/25.   3. Excessive fetal growth affecting management of pregnancy, antepartum, single or unspecified fetus -Size>dates  -US on 2/25 showed upper normal AFI, will follow up US on 3/22 for repeat testing, AFI and EFW.  -Close assessment of FH at next visit  - US MFM OB FOLLOW UP; Future  Preterm labor symptoms and general obstetric precautions including but not limited to vaginal bleeding, contractions, leaking of fluid and fetal movement were reviewed in detail with the patient. Please refer to After Visit Summary for other counseling recommendations.  Return in about 2 weeks (around 07/04/2017) for ROB. Return tomorrow morning for 2hrGTT   Sharyon CableVeronica C Harvy Riera, CNM

## 2017-06-21 ENCOUNTER — Other Ambulatory Visit: Payer: Medicaid Other

## 2017-06-21 DIAGNOSIS — Z3490 Encounter for supervision of normal pregnancy, unspecified, unspecified trimester: Secondary | ICD-10-CM

## 2017-06-21 LAB — CBC
Hematocrit: 34.8 % (ref 34.0–46.6)
Hemoglobin: 11.2 g/dL (ref 11.1–15.9)
MCH: 30.1 pg (ref 26.6–33.0)
MCHC: 32.2 g/dL (ref 31.5–35.7)
MCV: 94 fL (ref 79–97)
Platelets: 201 10*3/uL (ref 150–379)
RBC: 3.72 x10E6/uL — ABNORMAL LOW (ref 3.77–5.28)
RDW: 16 % — ABNORMAL HIGH (ref 12.3–15.4)
WBC: 8.8 10*3/uL (ref 3.4–10.8)

## 2017-06-21 LAB — HIV ANTIBODY (ROUTINE TESTING W REFLEX): HIV Screen 4th Generation wRfx: NONREACTIVE

## 2017-06-21 LAB — RPR: RPR Ser Ql: NONREACTIVE

## 2017-06-23 LAB — GLUCOSE, RANDOM: Glucose: 72 mg/dL (ref 65–99)

## 2017-06-28 ENCOUNTER — Inpatient Hospital Stay (HOSPITAL_COMMUNITY)
Admission: AD | Admit: 2017-06-28 | Discharge: 2017-07-02 | DRG: 786 | Disposition: A | Discharge status: Home / Self Care | Payer: Medicaid Other | Source: Ambulatory Visit | Attending: Obstetrics and Gynecology | Admitting: Obstetrics and Gynecology

## 2017-06-28 ENCOUNTER — Other Ambulatory Visit: Payer: Self-pay | Admitting: Certified Nurse Midwife

## 2017-06-28 ENCOUNTER — Ambulatory Visit (HOSPITAL_COMMUNITY)
Admission: RE | Admit: 2017-06-28 | Discharge: 2017-06-28 | Disposition: A | Discharge status: Home / Self Care | Payer: Medicaid Other | Source: Ambulatory Visit | Attending: Certified Nurse Midwife | Admitting: Certified Nurse Midwife

## 2017-06-28 ENCOUNTER — Other Ambulatory Visit: Payer: Self-pay

## 2017-06-28 ENCOUNTER — Encounter (HOSPITAL_COMMUNITY): Payer: Self-pay

## 2017-06-28 DIAGNOSIS — O99824 Streptococcus B carrier state complicating childbirth: Secondary | ICD-10-CM | POA: Diagnosis present

## 2017-06-28 DIAGNOSIS — Z362 Encounter for other antenatal screening follow-up: Secondary | ICD-10-CM

## 2017-06-28 DIAGNOSIS — O99344 Other mental disorders complicating childbirth: Secondary | ICD-10-CM | POA: Diagnosis present

## 2017-06-28 DIAGNOSIS — O9952 Diseases of the respiratory system complicating childbirth: Secondary | ICD-10-CM | POA: Diagnosis present

## 2017-06-28 DIAGNOSIS — O99210 Obesity complicating pregnancy, unspecified trimester: Secondary | ICD-10-CM

## 2017-06-28 DIAGNOSIS — O904 Postpartum acute kidney failure: Secondary | ICD-10-CM | POA: Diagnosis not present

## 2017-06-28 DIAGNOSIS — Z98891 History of uterine scar from previous surgery: Secondary | ICD-10-CM

## 2017-06-28 DIAGNOSIS — O403XX0 Polyhydramnios, third trimester, not applicable or unspecified: Secondary | ICD-10-CM | POA: Diagnosis present

## 2017-06-28 DIAGNOSIS — O1493 Unspecified pre-eclampsia, third trimester: Secondary | ICD-10-CM

## 2017-06-28 DIAGNOSIS — J45909 Unspecified asthma, uncomplicated: Secondary | ICD-10-CM | POA: Diagnosis present

## 2017-06-28 DIAGNOSIS — O3660X0 Maternal care for excessive fetal growth, unspecified trimester, not applicable or unspecified: Secondary | ICD-10-CM

## 2017-06-28 DIAGNOSIS — O411230 Chorioamnionitis, third trimester, not applicable or unspecified: Secondary | ICD-10-CM | POA: Diagnosis present

## 2017-06-28 DIAGNOSIS — O1414 Severe pre-eclampsia complicating childbirth: Secondary | ICD-10-CM | POA: Diagnosis present

## 2017-06-28 DIAGNOSIS — Z3A38 38 weeks gestation of pregnancy: Secondary | ICD-10-CM | POA: Diagnosis not present

## 2017-06-28 DIAGNOSIS — O1490 Unspecified pre-eclampsia, unspecified trimester: Secondary | ICD-10-CM | POA: Diagnosis present

## 2017-06-28 DIAGNOSIS — O411231 Chorioamnionitis, third trimester, fetus 1: Secondary | ICD-10-CM

## 2017-06-28 LAB — COMPREHENSIVE METABOLIC PANEL
ALBUMIN: 2.8 g/dL — AB (ref 3.5–5.0)
ALT: 7 U/L — ABNORMAL LOW (ref 14–54)
ANION GAP: 10 (ref 5–15)
AST: 33 U/L (ref 15–41)
Alkaline Phosphatase: 150 U/L — ABNORMAL HIGH (ref 38–126)
BUN: 8 mg/dL (ref 6–20)
CHLORIDE: 105 mmol/L (ref 101–111)
CO2: 22 mmol/L (ref 22–32)
Calcium: 8.8 mg/dL — ABNORMAL LOW (ref 8.9–10.3)
Creatinine, Ser: 0.59 mg/dL (ref 0.44–1.00)
GFR calc Af Amer: >60 mL/min (ref >60)
GLUCOSE: 83 mg/dL (ref 65–99)
POTASSIUM: 3.8 mmol/L (ref 3.5–5.1)
Sodium: 137 mmol/L (ref 135–145)
TOTAL PROTEIN: 6.3 g/dL — AB (ref 6.5–8.1)
Total Bilirubin: 0.5 mg/dL (ref 0.3–1.2)

## 2017-06-28 LAB — CBC
HCT: 32.1 % — ABNORMAL LOW (ref 36.0–46.0)
HEMATOCRIT: 32.3 % — AB (ref 36.0–46.0)
Hemoglobin: 10.8 g/dL — ABNORMAL LOW (ref 12.0–15.0)
Hemoglobin: 10.9 g/dL — ABNORMAL LOW (ref 12.0–15.0)
MCH: 30.5 pg (ref 26.0–34.0)
MCH: 31 pg (ref 26.0–34.0)
MCHC: 34 g/dL (ref 30.0–36.0)
MCHC: 33.4 g/dL (ref 30.0–36.0)
MCV: 91.2 fL (ref 78.0–100.0)
MCV: 91.2 fL (ref 78.0–100.0)
PLATELETS: 200 10*3/uL (ref 150–400)
PLATELETS: 214 10*3/uL (ref 150–400)
RBC: 3.52 MIL/uL — ABNORMAL LOW (ref 3.87–5.11)
RBC: 3.54 MIL/uL — ABNORMAL LOW (ref 3.87–5.11)
RDW: 15 % (ref 11.5–15.5)
RDW: 15 % (ref 11.5–15.5)
WBC: 9.9 10*3/uL (ref 4.0–10.5)
WBC: 9.8 10*3/uL (ref 4.0–10.5)

## 2017-06-28 LAB — URINALYSIS, ROUTINE W REFLEX MICROSCOPIC
BILIRUBIN URINE: NEGATIVE
GLUCOSE, UA: NEGATIVE mg/dL
HGB URINE DIPSTICK: NEGATIVE
Ketones, ur: NEGATIVE mg/dL
NITRITE: NEGATIVE
PROTEIN: NEGATIVE mg/dL
Specific Gravity, Urine: 1.01 (ref 1.005–1.030)
pH: 6 (ref 5.0–8.0)

## 2017-06-28 LAB — RAPID URINE DRUG SCREEN, HOSP PERFORMED
Amphetamines: NOT DETECTED
Barbiturates: NOT DETECTED
Benzodiazepines: NOT DETECTED
Cocaine: NOT DETECTED
Opiates: NOT DETECTED
TETRAHYDROCANNABINOL: NOT DETECTED

## 2017-06-28 LAB — PROTEIN / CREATININE RATIO, URINE
CREATININE, URINE: 126 mg/dL
PROTEIN CREATININE RATIO: 0.08 mg/mg{creat} (ref 0.00–0.15)
Total Protein, Urine: 10 mg/dL

## 2017-06-28 LAB — TYPE AND SCREEN
ABO/RH(D): O POS
Antibody Screen: NEGATIVE

## 2017-06-28 LAB — GROUP B STREP BY PCR: Group B strep by PCR: NEGATIVE

## 2017-06-28 LAB — OB RESULTS CONSOLE GBS: GBS: NEGATIVE

## 2017-06-28 MED ORDER — LABETALOL HCL 5 MG/ML IV SOLN: 20.0000 mg | INTRAVENOUS | Status: DC | PRN

## 2017-06-28 MED ORDER — FENTANYL 2.5 MCG/ML BUPIVACAINE 1/10 % EPIDURAL INFUSION (WH - ANES)
14.0000 mL/h | INTRAMUSCULAR | Status: DC | PRN
  Administered 2017-06-29: 14 mL/h via EPIDURAL
  Filled 2017-06-28 (×3): qty 100

## 2017-06-28 MED ORDER — PHENYLEPHRINE 40 MCG/ML (10ML) SYRINGE FOR IV PUSH (FOR BLOOD PRESSURE SUPPORT)
80.0000 ug | PREFILLED_SYRINGE | INTRAVENOUS | Status: DC | PRN
  Administered 2017-06-29: 80 ug via INTRAVENOUS

## 2017-06-28 MED ORDER — PHENYLEPHRINE 40 MCG/ML (10ML) SYRINGE FOR IV PUSH (FOR BLOOD PRESSURE SUPPORT)
80.0000 ug | PREFILLED_SYRINGE | INTRAVENOUS | Status: DC | PRN
  Filled 2017-06-28: qty 10

## 2017-06-28 MED ORDER — LACTATED RINGERS IV SOLN
INTRAVENOUS | Status: DC
  Administered 2017-06-28 – 2017-06-29 (×5): via INTRAVENOUS

## 2017-06-28 MED ORDER — ONDANSETRON HCL 4 MG/2ML IJ SOLN
4.0000 mg | Freq: Four times a day (QID) | INTRAMUSCULAR | Status: DC | PRN
  Administered 2017-06-29: 4 mg via INTRAVENOUS
  Filled 2017-06-28: qty 2

## 2017-06-28 MED ORDER — HYDRALAZINE HCL 20 MG/ML IJ SOLN: 10.0000 mg | Freq: Once | INTRAMUSCULAR | Status: DC | PRN

## 2017-06-28 MED ORDER — OXYTOCIN 40 UNITS IN LACTATED RINGERS INFUSION - SIMPLE MED: 2.5000 [IU]/h | INTRAVENOUS | Status: DC

## 2017-06-28 MED ORDER — OXYTOCIN BOLUS FROM INFUSION: 500.0000 mL | Freq: Once | INTRAVENOUS | Status: DC

## 2017-06-28 MED ORDER — LACTATED RINGERS IV SOLN
500.0000 mL | Freq: Once | INTRAVENOUS | Status: AC
  Administered 2017-06-29: 500 mL via INTRAVENOUS

## 2017-06-28 MED ORDER — MAGNESIUM SULFATE BOLUS VIA INFUSION
4.0000 g | Freq: Once | INTRAVENOUS | Status: AC
  Administered 2017-06-28: 4 g via INTRAVENOUS
  Filled 2017-06-28: qty 500

## 2017-06-28 MED ORDER — FENTANYL CITRATE (PF) 100 MCG/2ML IJ SOLN
100.0000 ug | INTRAMUSCULAR | Status: DC | PRN
  Administered 2017-06-28 (×2): 100 ug via INTRAVENOUS
  Filled 2017-06-28 (×2): qty 2

## 2017-06-28 MED ORDER — LIDOCAINE HCL (PF) 1 % IJ SOLN: 30.0000 mL | INTRAMUSCULAR | Status: DC | PRN

## 2017-06-28 MED ORDER — ACETAMINOPHEN 325 MG PO TABS
650.0000 mg | ORAL_TABLET | ORAL | Status: DC | PRN
  Administered 2017-06-29: 650 mg via ORAL
  Filled 2017-06-28: qty 2

## 2017-06-28 MED ORDER — BUTALBITAL-APAP-CAFFEINE 50-325-40 MG PO TABS
1.0000 | ORAL_TABLET | Freq: Once | ORAL | Status: AC
  Administered 2017-06-28: 1 via ORAL
  Filled 2017-06-28: qty 1

## 2017-06-28 MED ORDER — MISOPROSTOL 50MCG HALF TABLET
50.0000 ug | ORAL_TABLET | ORAL | Status: DC
  Administered 2017-06-28: 50 ug via ORAL
  Filled 2017-06-28: qty 1

## 2017-06-28 MED ORDER — EPHEDRINE 5 MG/ML INJ: 10.0000 mg | INTRAVENOUS | Status: DC | PRN

## 2017-06-28 MED ORDER — DIPHENHYDRAMINE HCL 50 MG/ML IJ SOLN: 12.5000 mg | INTRAMUSCULAR | Status: DC | PRN

## 2017-06-28 MED ORDER — LACTATED RINGERS IV SOLN
500.0000 mL | INTRAVENOUS | Status: DC | PRN
  Administered 2017-06-29: 350 mL via INTRAVENOUS

## 2017-06-28 MED ORDER — CALCIUM CARBONATE ANTACID 500 MG PO CHEW: 1.0000 | CHEWABLE_TABLET | Freq: Three times a day (TID) | ORAL | Status: DC

## 2017-06-28 MED ORDER — MAGNESIUM SULFATE 40 G IN LACTATED RINGERS - SIMPLE
2.0000 g/h | INTRAVENOUS | Status: AC
  Administered 2017-06-29 – 2017-06-30 (×2): 2 g/h via INTRAVENOUS
  Filled 2017-06-28: qty 500
  Filled 2017-06-28 (×2): qty 40

## 2017-06-28 MED ORDER — OXYCODONE-ACETAMINOPHEN 5-325 MG PO TABS: 2.0000 | ORAL_TABLET | ORAL | Status: DC | PRN

## 2017-06-28 MED ORDER — OXYCODONE-ACETAMINOPHEN 5-325 MG PO TABS: 1.0000 | ORAL_TABLET | ORAL | Status: DC | PRN

## 2017-06-28 MED ORDER — MISOPROSTOL 50MCG HALF TABLET
50.0000 ug | ORAL_TABLET | ORAL | Status: DC
  Administered 2017-06-28: 50 ug via BUCCAL
  Filled 2017-06-28 (×5): qty 1

## 2017-06-28 MED ORDER — TERBUTALINE SULFATE 1 MG/ML IJ SOLN: 0.2500 mg | Freq: Once | INTRAMUSCULAR | Status: DC | PRN

## 2017-06-28 MED ORDER — SOD CITRATE-CITRIC ACID 500-334 MG/5ML PO SOLN
30.0000 mL | ORAL | Status: DC | PRN
  Filled 2017-06-28: qty 15

## 2017-06-28 NOTE — H&P (Addendum)
Obstetric History and Physical  Holly Greene is a 22 y.o. G2P1001 with IUP at [redacted]w[redacted]d presenting for preeclampsia with severe feature (headache). Patient states she was being seen today for [place] and was noted to have mild polyhydramnios (AFI 25). Told the doctor that she was having headache and increased swelling. Symptoms just started yesterday with intermittent vision changes, headache, and mild peripheral edema. Denies RUQ pain.   Patient states she has been having  none contractions, none vaginal bleeding, intact membranes, with active fetal movement.    Prenatal Course Source of Care: [facility]  with onset of care at 20.2 weeks Dating: By LMP --->  Estimated Date of Delivery: 07/08/17 Pregnancy complications or risks: Patient Active Problem List   Diagnosis Date Noted  . Pyelonephritis 05/02/2017  . Trichimoniasis 05/02/2017  . AKI (acute kidney injury) (HCC) 05/02/2017  . Back pain affecting pregnancy in third trimester 04/26/2017  . Bipolar disorder (HCC) 03/06/2017  . Asthma 03/06/2017  . UTI in pregnancy 03/06/2017  . History of gestational hypertension 03/06/2017  . Encounter for supervision of normal pregnancy, unspecified, unspecified trimester 02/20/2017   She plans to bottle feed She desires undecided for postpartum contraception.   Sono:    @[redacted]w[redacted]d , CWD, normal anatomy, cephalic presentation, posterior placenta, 3599g, 85% EFW  Prenatal labs and studies: ABO, Rh: --/--/O POS (01/23 2046) Antibody: NEG (01/23 2046) Rubella: 4.69 (11/14 1426) RPR: Non Reactive (03/14 1528)  HBsAg: Negative (11/14 1426)  HIV: Non Reactive (03/14 1528)  QIO:NGEXBMWU (04/17 0000) Never got Glucola challenge Genetic screening too late Anatomy US normal  Prenatal Transfer Tool  Maternal Diabetes: No Genetic Screening: Declined Maternal Ultrasounds/Referrals: Normal Fetal Ultrasounds or other Referrals:  None Maternal Substance Abuse:  No Significant Maternal Medications:   None Significant Maternal Lab Results: Lab values include: Other: GBS unknown  Past Medical History:  Diagnosis Date  . Asthma    as a child  . Bipolar 1 disorder (HCC)   . Defiant behavior   . Eczema   . Hypertension     Past Surgical History:  Procedure Laterality Date  . TONSILLECTOMY      OB History  Gravida Para Term Preterm AB Living  2 1 1     1   SAB TAB Ectopic Multiple Live Births        0 1    # Outcome Date GA Lbr Len/2nd Weight Sex Delivery Anes PTL Lv  2 Current           1 Term 07/27/16 [redacted]w[redacted]d 08:36 / 01:13 3.442 kg (7 lb 9.4 oz) F Vag-Spont EPI  LIV    Social History   Socioeconomic History  . Marital status: Single    Spouse name: Not on file  . Number of children: Not on file  . Years of education: Not on file  . Highest education level: Not on file  Occupational History  . Not on file  Social Needs  . Financial resource strain: Not on file  . Food insecurity:    Worry: Not on file    Inability: Not on file  . Transportation needs:    Medical: Not on file    Non-medical: Not on file  Tobacco Use  . Smoking status: Never Smoker  . Smokeless tobacco: Never Used  Substance and Sexual Activity  . Alcohol use: No  . Drug use: No    Comment: smoked a week ago  . Sexual activity: Yes    Birth control/protection: None  Lifestyle  . Physical  activity:    Days per week: Not on file    Minutes per session: Not on file  . Stress: Not on file  Relationships  . Social connections:    Talks on phone: Not on file    Gets together: Not on file    Attends religious service: Not on file    Active member of club or organization: Not on file    Attends meetings of clubs or organizations: Not on file    Relationship status: Not on file  Other Topics Concern  . Not on file  Social History Narrative  . Not on file    Family History  Problem Relation Age of Onset  . Hypertension Mother     Medications Prior to Admission  Medication Sig Dispense  Refill Last Dose  . Prenat-FeAsp-Meth-FA-DHA w/o A (PRENATE PIXIE) 10-0.6-0.4-200 MG CAPS Take 1 tablet by mouth daily. 30 capsule 12 Past Week at Unknown time  . albuterol (PROVENTIL HFA;VENTOLIN HFA) 108 (90 Base) MCG/ACT inhaler Inhale 2 puffs into the lungs every 6 (six) hours as needed for wheezing or shortness of breath. (Patient not taking: Reported on 06/20/2017) 1 Inhaler 2 Not Taking  . ondansetron (ZOFRAN) 4 MG tablet Take 1 tablet (4 mg total) by mouth every 6 (six) hours as needed for nausea or vomiting. (Patient not taking: Reported on 06/20/2017) 30 tablet 2 Not Taking  . ranitidine (ZANTAC) 150 MG tablet Take 1 tablet (150 mg total) by mouth 2 (two) times daily. (Patient not taking: Reported on 05/20/2017) 60 tablet 2 Not Taking    No Known Allergies  Review of Systems: Negative except for what is mentioned in HPI.  Physical Exam: BP (!) 154/97   Pulse 80   Temp 98.4 F (36.9 C)   Resp 16   Wt 95.3 kg (210 lb 0.6 oz)   LMP 10/26/2016 (Approximate)   BMI 32.90 kg/m  CONSTITUTIONAL: Well-developed, well-nourished female in no acute distress.  HENT:  Normocephalic, atraumatic, External right and left ear normal. Oropharynx is clear and moist EYES: Conjunctivae and EOM are normal. Pupils are equal, round, and reactive to light. No scleral icterus.  NECK: Normal range of motion, supple, no masses SKIN: Skin is warm and dry. No rash noted. Not diaphoretic. No erythema. No pallor. NEUROLOGIC: Alert and oriented to person, place, and time. Normal reflexes, muscle tone coordination. No cranial nerve deficit noted. PSYCHIATRIC: Normal mood and affect. Normal behavior. Normal judgment and thought content. CARDIOVASCULAR: Normal heart rate noted, regular rhythm RESPIRATORY: Effort and breath sounds normal, no problems with respiration noted ABDOMEN: Soft, nontender, nondistended, gravid. MUSCULOSKELETAL: Normal range of motion. No edema and no tenderness. 2+ distal pulses.  Cervical  Exam: deferred FHT:  Baseline rate 150 bpm   Variability moderate  Accelerations present   Decelerations none Contractions: Occasional   Pertinent Labs/Studies:   Results for orders placed or performed during the hospital encounter of 06/28/17 (from the past 24 hour(s))  Urinalysis, Routine w reflex microscopic     Status: Abnormal   Collection Time: 06/28/17  2:00 PM  Result Value Ref Range   Color, Urine YELLOW YELLOW   APPearance HAZY (A) CLEAR   Specific Gravity, Urine 1.010 1.005 - 1.030   pH 6.0 5.0 - 8.0   Glucose, UA NEGATIVE NEGATIVE mg/dL   Hgb urine dipstick NEGATIVE NEGATIVE   Bilirubin Urine NEGATIVE NEGATIVE   Ketones, ur NEGATIVE NEGATIVE mg/dL   Protein, ur NEGATIVE NEGATIVE mg/dL   Nitrite NEGATIVE NEGATIVE  Leukocytes, UA LARGE (A) NEGATIVE   RBC / HPF 0-5 0 - 5 RBC/hpf   WBC, UA 6-30 0 - 5 WBC/hpf   Bacteria, UA RARE (A) NONE SEEN   Squamous Epithelial / LPF 6-30 (A) NONE SEEN  CBC     Status: Abnormal   Collection Time: 06/28/17  2:48 PM  Result Value Ref Range   WBC 9.9 4.0 - 10.5 K/uL   RBC 3.52 (L) 3.87 - 5.11 MIL/uL   Hemoglobin 10.9 (L) 12.0 - 15.0 g/dL   HCT 65.732.1 (L) 84.636.0 - 96.246.0 %   MCV 91.2 78.0 - 100.0 fL   MCH 31.0 26.0 - 34.0 pg   MCHC 34.0 30.0 - 36.0 g/dL   RDW 95.215.0 84.111.5 - 32.415.5 %   Platelets 200 150 - 400 K/uL    Assessment : Holly Greene is a 22 y.o. G2P1001 at 8141w4d being admitted for induction of labor due to preeclampsia based off severe features.   Plan: Labor: Induction as ordered as per protocol. Analgesia as needed. gHTN vs preeclampsia: elevated BPs but not in severe range. PIH labs negative. Has symptoms but improved s/p Fioricet. Would like to prophylactically give magnesium. Patient wants to think about it. Reassess.  FWB: Reassuring fetal heart tracing.   GBS unknown; collecting PCR Delivery plan: Hopeful for vaginal delivery   CSW consult due to limited prenatal care and mood disorders.    Caryl AdaJazma Phelps, DO OB  Fellow Faculty Practice, [facility] 06/28/2017, 3:30 PM   Addendum: EDD is not based on LMP, but on 21 week US done on 02/26/18 in MFM.  US on 06/04/17 (BPP) and on 05/31/17 are c/w EDD of 07/08/17 set at 21 week US.   Sharen CounterLisa Leftwich-Kirby, CNM 10:46 AM

## 2017-06-28 NOTE — Progress Notes (Signed)
Holly Greene is a 22 y.o. G2P1001 at 91102w4d admitted for induction of labor due to pre-eclampsia with severe features.  Subjective:  No complaint. Just had IV pain medication, reports contractions are less intense.   Objective: BP (!) 143/93 (BP Location: Left Arm)   Pulse 98   Temp 97.9 F (36.6 C) (Oral)   Resp 20   Ht 5\' 7"  (1.702 m)   Wt 210 lb (95.3 kg)   LMP 10/26/2016 (Approximate)   SpO2 99%   BMI 32.89 kg/m  I/O last 3 completed shifts: In: 415 [P.O.:240; I.V.:175] Out: 700 [Urine:700] Total I/O In: 615 [P.O.:240; I.V.:375] Out: 900 [Urine:500; Emesis/NG output:400]  FHT:  FHR: 150 bpm, variability: moderate,  accelerations:  Present,  decelerations:  Absent UC:   regular, every 2-3 minutes SVE:   Dilation: 3.5 Effacement (%): 50 Station: -3 Exam by:: Holly CharonJ. Degele, MD  Labs: Lab Results  Component Value Date   WBC 9.9 06/28/2017   HGB 10.9 (L) 06/28/2017   HCT 32.1 (L) 06/28/2017   MCV 91.2 06/28/2017   PLT 200 06/28/2017    Assessment / Plan: IOL, cervical ripening phase. Consider AROM and pitocin at next check.   Labor: Progressing normally Preeclampsia:  on magnesium sulfate and no signs or symptoms of toxicity Fetal Wellbeing:  Category I Pain Control:  IV pain meds I/D:  n/a Anticipated MOD:  NSVD  Holly Greene 06/28/2017, 10:23 PM

## 2017-06-28 NOTE — MAU Note (Signed)
Patient was here for US today, presents with increased swelling in her ankles and had a headache this morning.

## 2017-06-29 ENCOUNTER — Inpatient Hospital Stay (HOSPITAL_COMMUNITY): Payer: Medicaid Other | Admitting: Anesthesiology

## 2017-06-29 ENCOUNTER — Encounter (HOSPITAL_COMMUNITY): Payer: Self-pay

## 2017-06-29 ENCOUNTER — Encounter: Payer: Self-pay | Admitting: Certified Nurse Midwife

## 2017-06-29 ENCOUNTER — Encounter (HOSPITAL_COMMUNITY)
Admission: AD | Disposition: A | Discharge status: Home / Self Care | Payer: Self-pay | Source: Ambulatory Visit | Attending: Obstetrics and Gynecology

## 2017-06-29 DIAGNOSIS — O403XX0 Polyhydramnios, third trimester, not applicable or unspecified: Secondary | ICD-10-CM | POA: Insufficient documentation

## 2017-06-29 DIAGNOSIS — O1414 Severe pre-eclampsia complicating childbirth: Secondary | ICD-10-CM

## 2017-06-29 DIAGNOSIS — Z98891 History of uterine scar from previous surgery: Secondary | ICD-10-CM

## 2017-06-29 DIAGNOSIS — Z3A38 38 weeks gestation of pregnancy: Secondary | ICD-10-CM

## 2017-06-29 DIAGNOSIS — O411231 Chorioamnionitis, third trimester, fetus 1: Secondary | ICD-10-CM

## 2017-06-29 LAB — HEMOGLOBIN A1C
Hgb A1c MFr Bld: 4.6 % — ABNORMAL LOW (ref 4.8–5.6)
Mean Plasma Glucose: 85.32 mg/dL

## 2017-06-29 LAB — GLUCOSE, CAPILLARY
GLUCOSE-CAPILLARY: 103 mg/dL — AB (ref 65–99)
Glucose-Capillary: 101 mg/dL — ABNORMAL HIGH (ref 65–99)
Glucose-Capillary: 99 mg/dL (ref 65–99)

## 2017-06-29 LAB — RPR: RPR: NONREACTIVE

## 2017-06-29 LAB — MAGNESIUM: MAGNESIUM: 8.1 mg/dL — AB (ref 1.7–2.4)

## 2017-06-29 SURGERY — Surgical Case
Anesthesia: Epidural

## 2017-06-29 MED ORDER — KETOROLAC TROMETHAMINE 30 MG/ML IJ SOLN
INTRAMUSCULAR | Status: AC
  Filled 2017-06-29: qty 1

## 2017-06-29 MED ORDER — KETOROLAC TROMETHAMINE 30 MG/ML IJ SOLN
30.0000 mg | Freq: Four times a day (QID) | INTRAMUSCULAR | Status: DC | PRN
  Administered 2017-06-29: 30 mg via INTRAMUSCULAR

## 2017-06-29 MED ORDER — AMPICILLIN SODIUM 2 G IJ SOLR
2.0000 g | Freq: Four times a day (QID) | INTRAMUSCULAR | Status: DC
  Administered 2017-06-29: 2 g via INTRAVENOUS
  Filled 2017-06-29 (×3): qty 2000
  Filled 2017-06-29: qty 2

## 2017-06-29 MED ORDER — ONDANSETRON HCL 4 MG/2ML IJ SOLN
INTRAMUSCULAR | Status: DC | PRN
  Administered 2017-06-29: 4 mg via INTRAVENOUS

## 2017-06-29 MED ORDER — GENTAMICIN SULFATE 40 MG/ML IJ SOLN
180.0000 mg | Freq: Three times a day (TID) | INTRAMUSCULAR | Status: DC
  Administered 2017-06-29: 180 mg via INTRAVENOUS
  Filled 2017-06-29 (×3): qty 4.5

## 2017-06-29 MED ORDER — SCOPOLAMINE 1 MG/3DAYS TD PT72: 1.0000 | MEDICATED_PATCH | Freq: Once | TRANSDERMAL | Status: DC

## 2017-06-29 MED ORDER — CEFAZOLIN SODIUM-DEXTROSE 2-4 GM/100ML-% IV SOLN
INTRAVENOUS | Status: AC
  Filled 2017-06-29: qty 100

## 2017-06-29 MED ORDER — CEFAZOLIN SODIUM-DEXTROSE 2-4 GM/100ML-% IV SOLN
2.0000 g | INTRAVENOUS | Status: AC
  Administered 2017-06-29: 2 g via INTRAVENOUS

## 2017-06-29 MED ORDER — MEPERIDINE HCL 25 MG/ML IJ SOLN: 6.2500 mg | INTRAMUSCULAR | Status: DC | PRN

## 2017-06-29 MED ORDER — NALOXONE HCL 0.4 MG/ML IJ SOLN: 0.4000 mg | INTRAMUSCULAR | Status: DC | PRN

## 2017-06-29 MED ORDER — DIPHENHYDRAMINE HCL 25 MG PO CAPS
25.0000 mg | ORAL_CAPSULE | ORAL | Status: DC | PRN
  Filled 2017-06-29: qty 1

## 2017-06-29 MED ORDER — DIPHENHYDRAMINE HCL 50 MG/ML IJ SOLN: 12.5000 mg | INTRAMUSCULAR | Status: DC | PRN

## 2017-06-29 MED ORDER — PHENYLEPHRINE HCL 10 MG/ML IJ SOLN
INTRAMUSCULAR | Status: DC | PRN
  Administered 2017-06-29 (×2): 40 ug via INTRAVENOUS
  Administered 2017-06-29 (×4): 80 ug via INTRAVENOUS

## 2017-06-29 MED ORDER — ONDANSETRON HCL 4 MG/2ML IJ SOLN: 4.0000 mg | Freq: Three times a day (TID) | INTRAMUSCULAR | Status: DC | PRN

## 2017-06-29 MED ORDER — NALBUPHINE HCL 10 MG/ML IJ SOLN
5.0000 mg | Freq: Once | INTRAMUSCULAR | Status: DC | PRN
Start: 2017-06-29 — End: 2017-06-30

## 2017-06-29 MED ORDER — KETOROLAC TROMETHAMINE 30 MG/ML IJ SOLN: 30.0000 mg | Freq: Four times a day (QID) | INTRAMUSCULAR | Status: DC | PRN

## 2017-06-29 MED ORDER — MORPHINE SULFATE (PF) 0.5 MG/ML IJ SOLN
INTRAMUSCULAR | Status: AC
Start: 2017-06-29 — End: 2017-06-29
  Filled 2017-06-29: qty 10

## 2017-06-29 MED ORDER — SCOPOLAMINE 1 MG/3DAYS TD PT72
MEDICATED_PATCH | TRANSDERMAL | Status: DC | PRN
  Administered 2017-06-29: 1 via TRANSDERMAL

## 2017-06-29 MED ORDER — SODIUM CHLORIDE 0.9 % IR SOLN
Status: DC | PRN
  Administered 2017-06-29: 1000 mL

## 2017-06-29 MED ORDER — OXYTOCIN 10 UNIT/ML IJ SOLN
INTRAMUSCULAR | Status: AC
  Filled 2017-06-29: qty 4

## 2017-06-29 MED ORDER — LIDOCAINE-EPINEPHRINE (PF) 2 %-1:200000 IJ SOLN
INTRAMUSCULAR | Status: DC | PRN
  Administered 2017-06-29 (×2): 5 mL via EPIDURAL
  Administered 2017-06-29: 3 mL via EPIDURAL

## 2017-06-29 MED ORDER — LIDOCAINE HCL (PF) 1 % IJ SOLN
INTRAMUSCULAR | Status: DC | PRN
  Administered 2017-06-29 (×2): 4 mL

## 2017-06-29 MED ORDER — NALBUPHINE HCL 10 MG/ML IJ SOLN: 5.0000 mg | INTRAMUSCULAR | Status: DC | PRN

## 2017-06-29 MED ORDER — HYDROMORPHONE HCL 1 MG/ML IJ SOLN: 0.2500 mg | INTRAMUSCULAR | Status: DC | PRN

## 2017-06-29 MED ORDER — OXYTOCIN 10 UNIT/ML IJ SOLN
INTRAVENOUS | Status: DC | PRN
  Administered 2017-06-29: 40 [IU] via INTRAVENOUS

## 2017-06-29 MED ORDER — OXYTOCIN 40 UNITS IN LACTATED RINGERS INFUSION - SIMPLE MED: 1.0000 m[IU]/min | INTRAVENOUS | Status: DC

## 2017-06-29 MED ORDER — FENTANYL 2.5 MCG/ML BUPIVACAINE 1/10 % EPIDURAL INFUSION (WH - ANES)
14.0000 mL/h | INTRAMUSCULAR | Status: DC | PRN
  Administered 2017-06-29 (×3): 14 mL/h via EPIDURAL

## 2017-06-29 MED ORDER — ONDANSETRON HCL 4 MG/2ML IJ SOLN
INTRAMUSCULAR | Status: AC
  Filled 2017-06-29: qty 2

## 2017-06-29 MED ORDER — OXYTOCIN 40 UNITS IN LACTATED RINGERS INFUSION - SIMPLE MED
1.0000 m[IU]/min | INTRAVENOUS | Status: DC
  Administered 2017-06-29: 1 m[IU]/min via INTRAVENOUS
  Filled 2017-06-29: qty 1000

## 2017-06-29 MED ORDER — NALOXONE HCL 4 MG/10ML IJ SOLN
1.0000 ug/kg/h | INTRAVENOUS | Status: DC | PRN
  Filled 2017-06-29: qty 5

## 2017-06-29 MED ORDER — HYDROCODONE-ACETAMINOPHEN 7.5-325 MG PO TABS: 1.0000 | ORAL_TABLET | Freq: Once | ORAL | Status: DC | PRN

## 2017-06-29 MED ORDER — PROMETHAZINE HCL 25 MG/ML IJ SOLN: 6.2500 mg | INTRAMUSCULAR | Status: DC | PRN

## 2017-06-29 MED ORDER — ACETAMINOPHEN 10 MG/ML IV SOLN: 1000.0000 mg | Freq: Once | INTRAVENOUS | Status: DC | PRN

## 2017-06-29 MED ORDER — PHENYLEPHRINE 40 MCG/ML (10ML) SYRINGE FOR IV PUSH (FOR BLOOD PRESSURE SUPPORT)
PREFILLED_SYRINGE | INTRAVENOUS | Status: AC
  Filled 2017-06-29: qty 10

## 2017-06-29 MED ORDER — SODIUM CHLORIDE 0.9% FLUSH
3.0000 mL | INTRAVENOUS | Status: DC | PRN
Start: 2017-06-29 — End: 2017-07-02

## 2017-06-29 MED ORDER — DEXAMETHASONE SODIUM PHOSPHATE 4 MG/ML IJ SOLN
INTRAMUSCULAR | Status: AC
  Filled 2017-06-29: qty 1

## 2017-06-29 MED ORDER — TERBUTALINE SULFATE 1 MG/ML IJ SOLN: 0.2500 mg | Freq: Once | INTRAMUSCULAR | Status: DC | PRN

## 2017-06-29 MED ORDER — DEXAMETHASONE SODIUM PHOSPHATE 4 MG/ML IJ SOLN
INTRAMUSCULAR | Status: DC | PRN
  Administered 2017-06-29: 4 mg via INTRAVENOUS

## 2017-06-29 MED ORDER — MORPHINE SULFATE (PF) 0.5 MG/ML IJ SOLN
INTRAMUSCULAR | Status: DC | PRN
  Administered 2017-06-29: 3 mg via EPIDURAL

## 2017-06-29 SURGICAL SUPPLY — 33 items
BENZOIN TINCTURE PRP APPL 2/3 (GAUZE/BANDAGES/DRESSINGS) ×3 IMPLANT
CHLORAPREP W/TINT 26ML (MISCELLANEOUS) ×3 IMPLANT
CLAMP CORD UMBIL (MISCELLANEOUS) ×3 IMPLANT
CLOSURE WOUND 1/2 X4 (GAUZE/BANDAGES/DRESSINGS) ×2
DRSG OPSITE POSTOP 4X10 (GAUZE/BANDAGES/DRESSINGS) ×3 IMPLANT
ELECT REM PT RETURN 9FT ADLT (ELECTROSURGICAL) ×3
ELECTRODE REM PT RTRN 9FT ADLT (ELECTROSURGICAL) ×1 IMPLANT
EXTRACTOR VACUUM M CUP 4 TUBE (SUCTIONS) IMPLANT
EXTRACTOR VACUUM M CUP 4' TUBE (SUCTIONS)
GLOVE BIOGEL PI IND STRL 6.5 (GLOVE) ×2 IMPLANT
GLOVE BIOGEL PI IND STRL 7.0 (GLOVE) ×5 IMPLANT
GLOVE BIOGEL PI INDICATOR 6.5 (GLOVE) ×4
GLOVE BIOGEL PI INDICATOR 7.0 (GLOVE) ×10
GLOVE SURG SS PI 6.0 STRL IVOR (GLOVE) ×3 IMPLANT
GOWN STRL REUS W/TWL LRG LVL3 (GOWN DISPOSABLE) ×6 IMPLANT
KIT ABG SYR 3ML LUER SLIP (SYRINGE) IMPLANT
NEEDLE HYPO 25X5/8 SAFETYGLIDE (NEEDLE) IMPLANT
NS IRRIG 1000ML POUR BTL (IV SOLUTION) ×3 IMPLANT
PACK C SECTION WH (CUSTOM PROCEDURE TRAY) ×3 IMPLANT
PAD ABD 8X7 1/2 STERILE (GAUZE/BANDAGES/DRESSINGS) ×3 IMPLANT
PAD OB MATERNITY 4.3X12.25 (PERSONAL CARE ITEMS) ×3 IMPLANT
PENCIL SMOKE EVAC W/HOLSTER (ELECTROSURGICAL) ×3 IMPLANT
RTRCTR C-SECT PINK 25CM LRG (MISCELLANEOUS) ×3 IMPLANT
SEPRAFILM MEMBRANE 5X6 (MISCELLANEOUS) IMPLANT
SPONGE GAUZE 4X4 12PLY (GAUZE/BANDAGES/DRESSINGS) ×6 IMPLANT
STRIP CLOSURE SKIN 1/2X4 (GAUZE/BANDAGES/DRESSINGS) ×4 IMPLANT
SUT PLAIN 0 NONE (SUTURE) IMPLANT
SUT PLAIN 2 0 XLH (SUTURE) ×3 IMPLANT
SUT VIC AB 0 CT1 36 (SUTURE) ×12 IMPLANT
SUT VIC AB 4-0 KS 27 (SUTURE) ×3 IMPLANT
TAPE CLOTH SURG 4X10 WHT LF (GAUZE/BANDAGES/DRESSINGS) ×3 IMPLANT
TOWEL OR 17X24 6PK STRL BLUE (TOWEL DISPOSABLE) ×3 IMPLANT
TRAY FOLEY BAG SILVER LF 14FR (SET/KITS/TRAYS/PACK) ×3 IMPLANT

## 2017-06-29 NOTE — Transfer of Care (Signed)
Immediate Anesthesia Transfer of Care Note  Patient: Holly Greene  Procedure(s) Performed: CESAREAN SECTION (N/A )  Patient Location: PACU  Anesthesia Type:Epidural  Level of Consciousness: awake, alert , oriented and patient cooperative  Airway & Oxygen Therapy: Patient Spontanous Breathing  Post-op Assessment: Report given to RN and Post -op Vital signs reviewed and stable  Post vital signs: Reviewed and stable  Last Vitals:  Vitals Value Taken Time  BP 113/70 06/29/2017 10:38 PM  Temp    Pulse 93 06/29/2017 10:40 PM  Resp 11 06/29/2017 10:40 PM  SpO2 99 % 06/29/2017 10:40 PM  Vitals shown include unvalidated device data.  Last Pain:  Vitals:   06/29/17 2050  TempSrc: Oral  PainSc:          Complications: No apparent anesthesia complications

## 2017-06-29 NOTE — Anesthesia Pain Management Evaluation Note (Signed)
  [facility] Pain Management Visit Note  Patient: Holly Greene, 22 y.o., female  "Hello I am a member of the anesthesia team at [facility]. We have an anesthesia team available at all times to provide care throughout the hospital, including epidural management and anesthesia for C-section. I don't know your plan for the delivery whether it a natural birth, water birth, IV sedation, nitrous supplementation, doula or epidural, but we want to meet your pain goals."   1.Was your pain managed to your expectations on prior hospitalizations?   Yes   2.What is your expectation for pain management during this hospitalization?     Epidural  3.How can we help you reach that goal? unsure  Record the patient's initial score and the patient's pain goal.   Pain: 0  Pain Goal: 8 [facility] wants you to be able to say your pain was always managed very well.  Cephus ShellingBURGER,Jenasis Straley 06/29/2017

## 2017-06-29 NOTE — Progress Notes (Signed)
Holly Greene is a 22 y.o. G2P1001 at 8141w5d by ultrasound admitted for induction of labor due to preeclampsia.  Subjective: Pt comfortable with epidural, s/o in room for support.  Objective: BP 135/87   Pulse (!) 112   Temp (!) 100.8 F (38.2 C) (Axillary)   Resp 16   Ht 5\' 7"  (1.702 m)   Wt 210 lb (95.3 kg)   LMP 10/26/2016 (Approximate)   SpO2 98%   BMI 32.89 kg/m  I/O last 3 completed shifts: In: 2526.6 [P.O.:600; I.V.:1926.6] Out: 3375 [Urine:2375; Emesis/NG output:1000] Total I/O In: 1216.2 [P.O.:520; I.V.:696.2] Out: 625 [Urine:625]  FHT:  FHR: 140 bpm, variability: moderate with periods of minimal,  accelerations:  Present,  decelerations:  Absent UC:   irregular, every 1-5 minutes SVE:   Dilation: 8 Effacement (%): 80 Station: -2 Exam by:: Sharen CounterLisa Leftwich-Kirby, CNM  Labs: Lab Results  Component Value Date   WBC 9.8 06/28/2017   HGB 10.8 (L) 06/28/2017   HCT 32.3 (L) 06/28/2017   MCV 91.2 06/28/2017   PLT 214 06/28/2017    Assessment / Plan: Induction of labor due to preeclampsia Triple I  Labor: Protracted active phase with new onset maternal fever. Will treat for Triple I with ampicillin 2g Q 6 hours, gentamycin IV per pharmacy consult and Tylenol 650 mg PO x 1 dose now.  IV fluid bolus now.  Fetal scalp stimulation positive with overal Category I FHR tracing.  Pitocin on 10 milliunits/min. Will continue to increase.  Fetal position ROT to ROP.  Assisted RN to adjust pt to right lateral sims position for improved fetal position. Preeclampsia:  labs stable Fetal Wellbeing:  Category I Pain Control:  Epidural I/D:  GBS negative, Triple I on amp and gent Anticipated MOD:  NSVD  Sharen CounterLisa Leftwich-Kirby 06/29/2017, 2:22 PM

## 2017-06-29 NOTE — Op Note (Signed)
Holly Greene PROCEDURE DATE: 06/28/2017 - 06/29/2017  PREOPERATIVE DIAGNOSIS: Intrauterine pregnancy at  [redacted]w[redacted]d weeks gestation; failure to progress: arrest of dilation and severe preeclampsia  POSTOPERATIVE DIAGNOSIS: The same  PROCEDURE:     Cesarean Section  SURGEON:  Dr. Catalina AntiguaPeggy Aramis Zobel  ASSISTANT: Dr. Doroteo GlassmanPhelps  INDICATIONS: Holly Greene is a 22 y.o. [id number] at [redacted]w[redacted]d scheduled for cesarean section secondary to failure to progress: arrest of dilation and severe preeclampsia.  The risks of cesarean section discussed with the patient included but were not limited to: bleeding which may require transfusion or reoperation; infection which may require antibiotics; injury to bowel, bladder, ureters or other surrounding organs; injury to the fetus; need for additional procedures including hysterectomy in the event of a life-threatening hemorrhage; placental abnormalities wth subsequent pregnancies, incisional problems, thromboembolic phenomenon and other postoperative/anesthesia complications. The patient concurred with the proposed plan, giving informed written consent for the procedure.    FINDINGS:  Viable female infant in cephalic presentation.  Apgars 8 and 9.  Clear amniotic fluid.  Intact placenta, three vessel cord.  Normal uterus, fallopian tubes and ovaries bilaterally.  ANESTHESIA:    Spinal INTRAVENOUS FLUIDS:1500 ml ESTIMATED BLOOD LOSS: 1160 ml URINE OUTPUT:  200 ml SPECIMENS: Placenta sent to pathology COMPLICATIONS: None immediate  PROCEDURE IN DETAIL:  The patient received intravenous antibiotics and had sequential compression devices applied to her lower extremities while in the preoperative area.  She was then taken to the operating room where anesthesia was induced and was found to be adequate. A foley catheter was placed into her bladder and attached to Holly Greene gravity. She was then placed in a dorsal supine position with a leftward tilt, and prepped and draped in a sterile  manner. After an adequate timeout was performed, a Pfannenstiel skin incision was made with scalpel and carried through to the underlying layer of fascia. The fascia was incised in the midline and this incision was extended bilaterally using the Mayo scissors. Kocher clamps were applied to the superior aspect of the fascial incision and the underlying rectus muscles were dissected off bluntly. A similar process was carried out on the inferior aspect of the facial incision. The rectus muscles were separated in the midline bluntly and the peritoneum was entered bluntly. The Alexis self-retaining retractor was introduced into the abdominal cavity. Attention was turned to the lower uterine segment where a bladder flap was created, and a transverse hysterotomy was made with a scalpel and extended bilaterally bluntly. The infant was successfully delivered, and cord was clamped and cut and infant was handed over to awaiting neonatology team. Uterine massage was then administered and the placenta delivered intact with three-vessel cord. The uterus was cleared of clot and debris.  The hysterotomy was closed with 0 Vicryl in a running locked fashion, and an imbricating layer was also placed with a 0 Vicryl. Overall, excellent hemostasis was noted. The pelvis copiously irrigated and cleared of all clot and debris. Hemostasis was confirmed on all surfaces.  The peritoneum and the muscles were reapproximated using 0 vicryl interrupted stitches. The fascia was then closed using 0 Vicryl in a running fashion.  The subcutaneous layer was reapproximated with plain gut and the skin was closed in a subcuticular fashion using 3.0 Vicryl. The patient tolerated the procedure well. Sponge, lap, instrument and needle counts were correct x 2. She was taken to the recovery room in stable condition.    Holly Greene  06/29/2017 10:18 PM

## 2017-06-29 NOTE — Anesthesia Preprocedure Evaluation (Signed)
Anesthesia Evaluation  Patient identified by MRN, date of birth, ID band Patient awake    Reviewed: Allergy & Precautions, H&P , Patient's Chart, lab work & pertinent test results, reviewed documented beta blocker date and time   Airway Mallampati: II  TM Distance: >3 FB Neck ROM: full    Dental no notable dental hx.    Pulmonary asthma ,    Pulmonary exam normal breath sounds clear to auscultation       Cardiovascular hypertension,  Rhythm:regular Rate:Normal     Neuro/Psych    GI/Hepatic   Endo/Other    Renal/GU      Musculoskeletal   Abdominal   Peds  Hematology   Anesthesia Other Findings   Reproductive/Obstetrics                             Anesthesia Physical  Anesthesia Plan  ASA: II  Anesthesia Plan: Epidural   Post-op Pain Management:    Induction:   PONV Risk Score and Plan:   Airway Management Planned: Natural Airway  Additional Equipment:   Intra-op Plan:   Post-operative Plan:   Informed Consent: I have reviewed the patients History and Physical, chart, labs and discussed the procedure including the risks, benefits and alternatives for the proposed anesthesia with the patient or authorized representative who has indicated his/her understanding and acceptance.   Dental Advisory Given  Plan Discussed with: CRNA and Surgeon  Anesthesia Plan Comments:         Anesthesia Quick Evaluation

## 2017-06-29 NOTE — Progress Notes (Signed)
ANTIBIOTIC CONSULT NOTE - INITIAL  Pharmacy Consult for Gentamicin Indication: Chorioamnionitis   No Known Allergies  Patient Measurements: Height: 5\' 7"  (170.2 cm) Weight: 210 lb (95.3 kg) IBW/kg (Calculated) : 61.6 Adjusted Body Weight: 71.8 kg  Vital Signs: Temp: 100.8 F (38.2 C) (03/23 1400) Temp Source: Axillary (03/23 1400) BP: 135/87 (03/23 1330) Pulse Rate: 112 (03/23 1330)  Labs: Recent Labs    06/28/17 1400 06/28/17 1448 06/28/17 2340  WBC  --  9.9 9.8  HGB  --  10.9* 10.8*  PLT  --  200 214  LABCREA 126.00  --   --   CREATININE  --  0.59  --    No results for input(s): GENTTROUGH, GENTPEAK, GENTRANDOM in the last 72 hours.   Microbiology: Recent Results (from the past 720 hour(s))  Culture, OB Urine     Status: None   Collection Time: 06/03/17  5:51 PM  Result Value Ref Range Status   Specimen Description   Final    URINE, CATHETERIZED Performed at [facility], [address] [place], [place] [address]    Special Requests   Final    NONE Performed at [facility], [address] [place], [place] [address]    Culture   Final    NO GROWTH NO GROUP B STREP (S.AGALACTIAE) ISOLATED Performed at [facility], [address] [place], [place] [address]    Report Status 06/05/2017 FINAL  Final  OB RESULT CONSOLE Group B Strep     Status: None   Collection Time: 06/28/17 12:00 AM  Result Value Ref Range Status   GBS Negative  Final  Group B strep by PCR     Status: None   Collection Time: 06/28/17  2:55 PM  Result Value Ref Range Status   Group B strep by PCR NEGATIVE NEGATIVE Final    Comment: Performed at [facility], [address] [place], [place] [address]    Medications:  Ampicillin 2gm IV q6hrs  Assessment: 22 y.o. female G2P1001 at 4335w5d on ampicillin and gentamicin.  Estimated Ke = 0.430, Vd = 30.1L  Goal of Therapy:  Gentamicin peak 6-8 mg/L and Trough < 1 mg/L  Plan:  Gentamicin 180 mg IV every 8 hrs   Check Scr with next labs if gentamicin continued. Will check gentamicin levels if continued > 72hr or clinically indicated.  06/29/2017,2:38 PM  Wendie Simmerynthia Mikalyn Hermida, PharmD, BCPS Clinical Pharmacist

## 2017-06-29 NOTE — Progress Notes (Signed)
Patient ID: Holly Greene, female   DOB: [birth year], 22 y.o.   MRN: [id number] Patient without cervical change for over 4 hours with adequate contractions. Discussed delivery via primary cesarean section secondary to arrest of dilatation. RIsks, benefits and alternatives were explained including but not limited to risks of bleeding, infection and damage to adjacent organs. Patient verbalized understanding and all questions were answered FHT: baseline 165, min variability, no accels, occasional late decels Will continue magnesium sulfate for seizure prophylaxis

## 2017-06-29 NOTE — Anesthesia Procedure Notes (Signed)
Epidural Patient location during procedure: OB Start time: 06/29/2017 12:15 AM End time: 06/29/2017 12:25 AM  Staffing Anesthesiologist: Lewie LoronGermeroth, Patirica Longshore, MD Performed: anesthesiologist   Preanesthetic Checklist Completed: patient identified, pre-op evaluation, timeout performed, IV checked, risks and benefits discussed and monitors and equipment checked  Epidural Patient position: sitting Prep: site prepped and draped and DuraPrep Patient monitoring: heart rate, continuous pulse ox and blood pressure Approach: midline Location: L3-L4 Injection technique: LOR air and LOR saline  Needle:  Needle type: Tuohy  Needle gauge: 17 G Needle length: 9 cm Needle insertion depth: 9 cm Catheter type: closed end flexible Catheter size: 19 Gauge Catheter at skin depth: 14 cm Test dose: negative  Assessment Sensory level: T8 Events: blood not aspirated, injection not painful, no injection resistance, negative IV test and no paresthesia  Additional Notes Reason for block:procedure for pain

## 2017-06-29 NOTE — Progress Notes (Signed)
Holly Greene is a 22 y.o. G2P1001 at 5546w5d admitted for induction of labor due to preeclampsia with severe features.  Subjective: Pt comfortable with epidural. Drowsy, responds appropriately to questions.  Objective: BP 136/86   Pulse (!) 107   Temp 97.7 F (36.5 C) (Oral)   Resp 16   Ht 5\' 7"  (1.702 m)   Wt 210 lb (95.3 kg)   LMP 10/26/2016 (Approximate)   SpO2 99%   BMI 32.89 kg/m  I/O last 3 completed shifts: In: 5694.2 [P.O.:1580; I.V.:3705.2; IV Piggyback:409] Out: 4185 [Urine:3185; Emesis/NG output:1000] No intake/output data recorded.  FHT:  FHR: 145 bpm, variability: moderate,  accelerations:  Abscent,  decelerations:  Absent UC:   regular, every 2-3 minutes, MVUs >200 SVE:   Dilation: 8 Effacement (%): 100 Station: 0 Exam by:: Holly Greene  Labs: Lab Results  Component Value Date   WBC 9.8 06/28/2017   HGB 10.8 (L) 06/28/2017   HCT 32.3 (L) 06/28/2017   MCV 91.2 06/28/2017   PLT 214 06/28/2017    Assessment / Plan: Protracted active phase Triple I  Labor: Pt with limited labor progress despite adequate contractions.  Cervix 8 cm with swelling.  RN to reposition pt, continue Pitocin for now and will discuss with Dr Jolayne Pantheronstant for plan of care. Preeclampsia:  on magnesium sulfate and drowsiness but lung sounds clear reflexes wnl.  Mag level ordered. Fetal Wellbeing:  Category I Pain Control:  Epidural I/D:  GBS neg Anticipated MOD:  NSVD  Holly Greene 06/29/2017, 7:56 PM

## 2017-06-29 NOTE — Progress Notes (Signed)
Holly Greene is a 22 y.o. G2P1001 at 4828w5d by 21 week ultrasound admitted for induction of labor due to preeclampsia without severe features.  Subjective: Pt comfortable with epidural. Reports some rectal pressure with contractions but not painful.  S/O in room for support.  Objective: BP 120/68   Pulse 98   Temp 98.2 F (36.8 C) (Axillary)   Resp 18   Ht 5\' 7"  (1.702 m)   Wt 210 lb (95.3 kg)   LMP 10/26/2016 (Approximate)   SpO2 98%   BMI 32.89 kg/m  I/O last 3 completed shifts: In: 2526.6 [P.O.:600; I.V.:1926.6] Out: 3375 [Urine:2375; Emesis/NG output:1000] Total I/O In: 131 [I.V.:131] Out: 175 [Urine:175]  FHT:  FHR: 140 bpm, variability: moderate,  accelerations:  Present,  decelerations:  Absent UC:   regular, every 2-3 minutes SVE:   Dilation: 7 Effacement (%): 80 Station: -1 Exam by:: Sharen CounterLisa Leftwich-Kirby, CNM  IUPC placed without difficulty. Pt tolerated well.   Labs: Lab Results  Component Value Date   WBC 9.8 06/28/2017   HGB 10.8 (L) 06/28/2017   HCT 32.3 (L) 06/28/2017   MCV 91.2 06/28/2017   PLT 214 06/28/2017    Assessment / Plan: Induction of labor due to preeclampsia,  progressing well on pitocin  Labor: Protracted active phase.  Pt with limited prenatal care, no glucose testing. Leopolds EFW 8lbs so consistent with US on 06/28/17.  Previous baby was 7lb12 oz per pt.  IUPC placed, will continue to titrate Pitocin and evaluate labor progress.  Blood glucose Q 2 hours for now and A1C added to labs for futher evaluation. Preeclampsia:  labs stable Fetal Wellbeing:  Category I Pain Control:  Epidural I/D:  GBS neg Anticipated MOD:  NSVD  Sharen CounterLisa Leftwich-Kirby 06/29/2017, 10:13 AM

## 2017-06-29 NOTE — Progress Notes (Signed)
Holly Greene is a 22 y.o. G2P1001 at 5021w5d admitted for induction of labor due to preeclampsia.  Subjective: Pt reports rectal pressure.  Family in room for support.  Objective: BP 122/77   Pulse (!) 107   Temp 98.8 F (37.1 C) (Oral)   Resp 18   Ht 5\' 7"  (1.702 m)   Wt 210 lb (95.3 kg)   LMP 10/26/2016 (Approximate)   SpO2 98%   BMI 32.89 kg/m  I/O last 3 completed shifts: In: 2526.6 [P.O.:600; I.V.:1926.6] Out: 3375 [Urine:2375; Emesis/NG output:1000] Total I/O In: 2634.6 [P.O.:880; I.V.:1345.6; IV Piggyback:409] Out: 725 [Urine:725]  FHT:  FHR: 145 bpm, variability: moderate,  accelerations:  Present,  decelerations:  Absent UC:   regular, every 3 minutes SVE:   Dilation: 8 Effacement (%):100 Station:  -1 Exam by:: Sharen CounterLisa Leftwich-Kirby CNM  Labs: Lab Results  Component Value Date   WBC 9.8 06/28/2017   HGB 10.8 (L) 06/28/2017   HCT 32.3 (L) 06/28/2017   MCV 91.2 06/28/2017   PLT 214 06/28/2017    Assessment / Plan: Induction of labor due to preclampsia Triple I  Labor: Protracted active phase.  Although cervical change is minimal on this exam, fetal position is now OA and head well applied. Fetal station is also slighly lower.  Contractions are now regular and FHR is Category I with improved variability from tracing 4-5 hours ago.  Will reevaluate in 2 hours.  With adequate contractions and good fetal position will expect cervical change. Preeclampsia:  labs stable Fetal Wellbeing:  Category I Pain Control:  Epidural I/D:  Triple I on Amp/Gent Anticipated MOD:  NSVD  Sharen CounterLisa Leftwich-Kirby 06/29/2017, 5:53 PM

## 2017-06-29 NOTE — Progress Notes (Signed)
Holly Greene is a 22 y.o. G2P1001 at 6062w5d  admitted for induction of labor due to Pre-eclamptic toxemia of pregnancy..  Subjective:  Comfortable with epidural.  Objective: BP 114/69   Pulse (!) 104   Temp 97.7 F (36.5 C) (Oral)   Resp 16   Ht 5\' 7"  (1.702 m)   Wt 210 lb (95.3 kg)   LMP 10/26/2016 (Approximate)   SpO2 98%   BMI 32.89 kg/m  I/O last 3 completed shifts: In: 415 [P.O.:240; I.V.:175] Out: 700 [Urine:700] Total I/O In: 1860 [P.O.:360; I.V.:1500] Out: 2325 [Urine:1325; Emesis/NG output:1000]  FHT:  FHR: 135 bpm, variability: moderate,  accelerations:  Present,  decelerations:  Absent UC:   irregular, every 2-7 minutes SVE:  4.5/70/-2/BBOW/AROM, clear fluid  Labs: Lab Results  Component Value Date   WBC 9.8 06/28/2017   HGB 10.8 (L) 06/28/2017   HCT 32.3 (L) 06/28/2017   MCV 91.2 06/28/2017   PLT 214 06/28/2017    Assessment / Plan: IOL cytotec x2, now AROM and will start pitocin  Labor: not in active labor yet  Preeclampsia:  on magnesium sulfate and no signs or symptoms of toxicity Fetal Wellbeing:  Category I Pain Control:  Epidural I/D:  n/a Anticipated MOD:  NSVD  Thressa ShellerHeather Onia Shiflett 06/29/2017, 3:38 AM

## 2017-06-30 ENCOUNTER — Encounter (HOSPITAL_COMMUNITY): Payer: Self-pay | Admitting: Obstetrics and Gynecology

## 2017-06-30 ENCOUNTER — Other Ambulatory Visit: Payer: Self-pay

## 2017-06-30 LAB — COMPREHENSIVE METABOLIC PANEL
ALT: 7 U/L — AB (ref 14–54)
AST: 35 U/L (ref 15–41)
Albumin: 2.3 g/dL — ABNORMAL LOW (ref 3.5–5.0)
Alkaline Phosphatase: 123 U/L (ref 38–126)
Anion gap: 10 (ref 5–15)
BUN: 8 mg/dL (ref 6–20)
CHLORIDE: 99 mmol/L — AB (ref 101–111)
CO2: 21 mmol/L — ABNORMAL LOW (ref 22–32)
CREATININE: 0.93 mg/dL (ref 0.44–1.00)
Calcium: 8 mg/dL — ABNORMAL LOW (ref 8.9–10.3)
GFR calc non Af Amer: >60 mL/min (ref >60)
Glucose, Bld: 119 mg/dL — ABNORMAL HIGH (ref 65–99)
POTASSIUM: 4.3 mmol/L (ref 3.5–5.1)
SODIUM: 130 mmol/L — AB (ref 135–145)
Total Bilirubin: 0.7 mg/dL (ref 0.3–1.2)
Total Protein: 5.7 g/dL — ABNORMAL LOW (ref 6.5–8.1)

## 2017-06-30 LAB — CBC
HCT: 28.9 % — ABNORMAL LOW (ref 36.0–46.0)
HCT: 30.9 % — ABNORMAL LOW (ref 36.0–46.0)
Hemoglobin: 10.5 g/dL — ABNORMAL LOW (ref 12.0–15.0)
Hemoglobin: 9.9 g/dL — ABNORMAL LOW (ref 12.0–15.0)
MCH: 30.9 pg (ref 26.0–34.0)
MCH: 30.9 pg (ref 26.0–34.0)
MCHC: 34 g/dL (ref 30.0–36.0)
MCHC: 34.3 g/dL (ref 30.0–36.0)
MCV: 90.3 fL (ref 78.0–100.0)
MCV: 90.9 fL (ref 78.0–100.0)
PLATELETS: 188 10*3/uL (ref 150–400)
PLATELETS: 194 10*3/uL (ref 150–400)
RBC: 3.2 MIL/uL — AB (ref 3.87–5.11)
RBC: 3.4 MIL/uL — ABNORMAL LOW (ref 3.87–5.11)
RDW: 15.1 % (ref 11.5–15.5)
RDW: 15.1 % (ref 11.5–15.5)
WBC: 17.8 10*3/uL — AB (ref 4.0–10.5)
WBC: 21.6 10*3/uL — AB (ref 4.0–10.5)

## 2017-06-30 LAB — MAGNESIUM: Magnesium: 6 mg/dL — ABNORMAL HIGH (ref 1.7–2.4)

## 2017-06-30 LAB — CREATININE, SERUM
Creatinine, Ser: 1.39 mg/dL — ABNORMAL HIGH (ref 0.44–1.00)
GFR calc non Af Amer: 53 mL/min — ABNORMAL LOW (ref >60)

## 2017-06-30 MED ORDER — LACTATED RINGERS IV SOLN
INTRAVENOUS | Status: DC
  Administered 2017-06-30 (×2): via INTRAVENOUS

## 2017-06-30 MED ORDER — SENNOSIDES-DOCUSATE SODIUM 8.6-50 MG PO TABS
2.0000 | ORAL_TABLET | ORAL | Status: DC
  Administered 2017-06-30 – 2017-07-01 (×2): 2 via ORAL
  Filled 2017-06-30 (×2): qty 2

## 2017-06-30 MED ORDER — MENTHOL 3 MG MT LOZG: 1.0000 | LOZENGE | OROMUCOSAL | Status: DC | PRN

## 2017-06-30 MED ORDER — DIBUCAINE 1 % RE OINT: 1.0000 "application " | TOPICAL_OINTMENT | RECTAL | Status: DC | PRN

## 2017-06-30 MED ORDER — SIMETHICONE 80 MG PO CHEW
80.0000 mg | CHEWABLE_TABLET | ORAL | Status: DC
Start: 2017-06-30 — End: 2017-07-02
  Administered 2017-06-30 – 2017-07-01 (×2): 80 mg via ORAL
  Filled 2017-06-30 (×2): qty 1

## 2017-06-30 MED ORDER — SODIUM CHLORIDE 0.9 % IV SOLN
INTRAVENOUS | Status: DC
  Administered 2017-06-30: 15:00:00 via INTRAVENOUS

## 2017-06-30 MED ORDER — OXYCODONE HCL 5 MG PO TABS
5.0000 mg | ORAL_TABLET | ORAL | Status: DC | PRN
  Administered 2017-07-01 (×2): 5 mg via ORAL
  Filled 2017-06-30 (×2): qty 1

## 2017-06-30 MED ORDER — COCONUT OIL OIL: 1.0000 "application " | TOPICAL_OIL | Status: DC | PRN

## 2017-06-30 MED ORDER — DIPHENHYDRAMINE HCL 25 MG PO CAPS: 25.0000 mg | ORAL_CAPSULE | Freq: Four times a day (QID) | ORAL | Status: DC | PRN

## 2017-06-30 MED ORDER — ENOXAPARIN SODIUM 40 MG/0.4ML ~~LOC~~ SOLN
40.0000 mg | SUBCUTANEOUS | Status: DC
  Administered 2017-06-30 – 2017-07-01 (×2): 40 mg via SUBCUTANEOUS
  Filled 2017-06-30 (×2): qty 0.4

## 2017-06-30 MED ORDER — ACETAMINOPHEN 325 MG PO TABS
650.0000 mg | ORAL_TABLET | ORAL | Status: DC | PRN
  Administered 2017-07-01: 650 mg via ORAL
  Filled 2017-06-30: qty 2

## 2017-06-30 MED ORDER — PRENATAL MULTIVITAMIN CH
1.0000 | ORAL_TABLET | Freq: Every day | ORAL | Status: DC
  Administered 2017-06-30 – 2017-07-01 (×2): 1 via ORAL
  Filled 2017-06-30 (×2): qty 1

## 2017-06-30 MED ORDER — OXYTOCIN 40 UNITS IN LACTATED RINGERS INFUSION - SIMPLE MED: 2.5000 [IU]/h | INTRAVENOUS | Status: AC

## 2017-06-30 MED ORDER — ZOLPIDEM TARTRATE 5 MG PO TABS: 5.0000 mg | ORAL_TABLET | Freq: Every evening | ORAL | Status: DC | PRN

## 2017-06-30 MED ORDER — OXYCODONE HCL 5 MG PO TABS
10.0000 mg | ORAL_TABLET | ORAL | Status: DC | PRN
  Administered 2017-07-01 – 2017-07-02 (×2): 10 mg via ORAL
  Filled 2017-06-30 (×2): qty 2

## 2017-06-30 MED ORDER — SIMETHICONE 80 MG PO CHEW
80.0000 mg | CHEWABLE_TABLET | Freq: Three times a day (TID) | ORAL | Status: DC
Start: 2017-06-30 — End: 2017-07-02
  Administered 2017-06-30 – 2017-07-02 (×6): 80 mg via ORAL
  Filled 2017-06-30 (×4): qty 1

## 2017-06-30 MED ORDER — WITCH HAZEL-GLYCERIN EX PADS: 1.0000 "application " | MEDICATED_PAD | CUTANEOUS | Status: DC | PRN

## 2017-06-30 MED ORDER — SIMETHICONE 80 MG PO CHEW: 80.0000 mg | CHEWABLE_TABLET | ORAL | Status: DC | PRN

## 2017-06-30 MED ORDER — IBUPROFEN 600 MG PO TABS
600.0000 mg | ORAL_TABLET | Freq: Four times a day (QID) | ORAL | Status: DC
  Administered 2017-06-30 – 2017-07-02 (×9): 600 mg via ORAL
  Filled 2017-06-30 (×9): qty 1

## 2017-06-30 MED ORDER — TETANUS-DIPHTH-ACELL PERTUSSIS 5-2.5-18.5 LF-MCG/0.5 IM SUSP
0.5000 mL | Freq: Once | INTRAMUSCULAR | Status: AC
  Administered 2017-07-01: 0.5 mL via INTRAMUSCULAR

## 2017-06-30 NOTE — Anesthesia Postprocedure Evaluation (Signed)
Anesthesia Post Note  Patient: Holly Greene  Procedure(s) Performed: CESAREAN SECTION (N/A )     Patient location during evaluation: Mother Baby Anesthesia Type: Epidural Level of consciousness: awake and alert Pain management: pain level controlled Vital Signs Assessment: post-procedure vital signs reviewed and stable Respiratory status: spontaneous breathing, nonlabored ventilation and respiratory function stable Cardiovascular status: stable Postop Assessment: no headache, no backache and epidural receding Anesthetic complications: no    Last Vitals:  Vitals:   06/30/17 0000 06/30/17 0100  BP: 130/81 125/75  Pulse: 93 91  Resp: 18 (P) 18  Temp: 36.6 C (P) 36.9 C  SpO2: 96% 97%    Last Pain:  Vitals:   06/30/17 0100  TempSrc: (P) Oral  PainSc:    Pain Goal:                 Trevor IhaStephen A Houser

## 2017-06-30 NOTE — Addendum Note (Signed)
Addendum  created 06/30/17 0756 by Angela AdamWrinkle, Abeera Flannery G, CRNA   Sign clinical note

## 2017-06-30 NOTE — Clinical Social Work Maternal (Signed)
CLINICAL SOCIAL WORK MATERNAL/CHILD NOTE  Patient Details  Name: Holly Greene MRN: [id number] Date of Birth: 07/07/1995  Date:  12/19/17  Clinical Social Worker Initiating Note:  Madilyn Fireman, MSW, LCSW-A Date/Time: Initiated:  06/30/17/1205     Child's Name:  Holly Greene.   Biological Parents:  Mother, Father   Need for Interpreter:  None   Reason for Referral:  Current Substance Use/Substance Use During Pregnancy    Address:  [place] [address]    Phone number:  [phone] (home)     Additional phone number: None  Household Members/Support Persons (HM/SP):   Household Member/Support Person 1, Household Member/Support Person 2   HM/SP Name Relationship DOB or Age  HM/SP -1 [employer] Estate agent Father of baby    HM/SP -[place] Mother    HM/SP -3        HM/SP -4        HM/SP -5        HM/SP -6        HM/SP -7        HM/SP -8          Natural Supports (not living in the home):  Extended Family, Immediate Family, Friends   Professional Supports: Case Manager/Social Worker(Patient reports having an OBCM from the [place])   Employment: Unemployed   Type of Work:     Education:  9 to 11 years   Homebound arranged:    Museum/gallery curator Resources:  Medicaid   Other Resources:  [facility]   Cultural/Religious Considerations Which May Impact Care:  None  Strengths:  Ability to meet basic needs , Home prepared for child , Pediatrician chosen   Psychotropic Medications:         Pediatrician:    [employer] area  Pediatrician List:   [place]      Pediatrician Fax Number:    Risk Factors/Current Problems:      Cognitive State:  Alert , Able to Concentrate , Goal Oriented    Mood/Affect:  Comfortable , Calm , Happy , Interested , Bright    CSW Assessment: CSW met with patient and her spouse and father of  the baby, Hillis Mcphatter. CSW obtained permission from patient to have discussion in front of Yasin. CSW and patient discussed the support system which includes both sides of their families. Patient stated that her 16 month old, Holly Greene is currently in the care of Leonette Nutting, maternal grandmother at this time. Patient stated that in the beginning of pregnancy she was experiencing housing instability and that she had to stay in a hotel for a while before she was able to return to her mother's home. Patient reports that she has been staying with her mother and feels safe there, and can return upon discharge from [facility]. Parents report having no prior CPS history. CSW informed patient of the hospital's drug screening policy due to her having limited prenatal care. Parents stated understanding of CSW being mandated to make notification to DSS based on cord tissue test results. Parents and CSW discussed other agencies who could provide them with resources and support, CSW made referral to [facility] program and parents were informed of referral. CSW encouraged patient to utilize the services offered by her Pregnancy Care Manager so that she can be better connected with resources in the community. Patient's OBCM  is Cara McCovery at the [facility]. Patient stated that she has a history of bipolar, she was diagnosed at age [age]. Patient reports not ever having anxiety or depression, does not take medication and doesn't believe she needs to be. CSW encouraged patient to reach out for assistance if significant mood changes begin to occur, expressed understanding. Patient's limited prenatal care stems from lack of stable housing and lack of personal transportation. CSW offered to assist patient with learning [place] [employer]'s bus route, but she declined stating already knows. Parents very appropriate with newborn, asking questions that would enable them to achieve success, and thankful for CSW engagement. Patient  stated that she has been in a great mood since delivery, and is not having any symptoms of mental distress, suicidal, or homicidal ideations. CSW informed parents to please ask for assistance prior to and after discharge.  CSW Plan/Description:  No Further Intervention Required/No Barriers to Discharge, Psychosocial Support and Ongoing Assessment of Needs, Sudden Infant Death Syndrome (SIDS) Education, Perinatal Mood and Anxiety Disorder (PMADs) Education, Neonatal Abstinence Syndrome (NAS) Education, CSW Will Continue to Monitor Umbilical Cord Tissue Drug Screen Results and Make Report if Warranted    Ninetta Adelstein L Brynley Cuddeback, LCSWA 06/30/2017, 12:09 PM   

## 2017-06-30 NOTE — Progress Notes (Addendum)
Faculty Attending Note  Post Op Day 1  Subjective: Patient is feeling well. She reports moderately well controlled pain on PO pain meds. She is ambulating and denies light-headedness or dizziness. She is passing flatus. She is tolerating a regular diet without nausea/vomiting. Bleeding is moderate. She is bottle feeding. Baby is in nursery and doing well.  Objective: Blood pressure 132/86, pulse 74, temperature 97.6 F (36.4 C), temperature source Oral, resp. rate 18, height 5\' 7"  (1.702 m), weight 210 lb (95.3 kg), last menstrual period 10/26/2016, SpO2 99 %, unknown if currently breastfeeding. Temp:  [97.3 F (36.3 C)-100.8 F (38.2 C)] 97.6 F (36.4 C) (03/24 0836) Pulse Rate:  [71-118] 74 (03/24 0836) Resp:  [11-19] 18 (03/24 0836) BP: (111-142)/(57-95) 132/86 (03/24 0836) SpO2:  [95 %-100 %] 99 % (03/24 0836)  Physical Exam:  General: alert, oriented, cooperative Chest: CTAB, normal respiratory effort Heart: RRR  Abdomen: +BS, soft, appropriately tender to palpation, incision covered by dressing with no evidence of active bleedingclean/dry/intact  Uterine Fundus: firm, 2 fingers below the umbilicus Lochia: moderate, rubra DVT Evaluation: no evidence of DVT Extremities: no edema, no calf tenderness  UOP: voiding spontaneously   Current Facility-Administered Medications:  .  acetaminophen (TYLENOL) tablet 650 mg, 650 mg, Oral, Q4H PRN, Phelps, Jazma Y, DO .  coconut oil, 1 application, Topical, PRN, Doroteo Glassman, Jazma Y, DO .  witch hazel-glycerin (TUCKS) pad 1 application, 1 application, Topical, PRN **AND** dibucaine (NUPERCAINAL) 1 % rectal ointment 1 application, 1 application, Rectal, PRN, Doroteo Glassman, Jazma Y, DO .  diphenhydrAMINE (BENADRYL) capsule 25 mg, 25 mg, Oral, Q6H PRN, Doroteo Glassman, Jazma Y, DO .  enoxaparin (LOVENOX) injection 40 mg, 40 mg, Subcutaneous, Q24H, Phelps, Jazma Y, DO .  ibuprofen (ADVIL,MOTRIN) tablet 600 mg, 600 mg, Oral, Q6H, Phelps, Jazma Y, DO, 600 mg at  06/30/17 0631 .  lactated ringers infusion, , Intravenous, Continuous, Pincus Large, DO, Last Rate: 100 mL/hr at 06/30/17 9562 .  magnesium sulfate 40 grams in LR 500 mL OB infusion, 2 g/hr, Intravenous, Continuous, Pincus Large, DO, Last Rate: 25 mL/hr at 06/29/17 2245, 2 g/hr at 06/29/17 2245 .  menthol-cetylpyridinium (CEPACOL) lozenge 3 mg, 1 lozenge, Oral, Q2H PRN, Phelps, Jazma Y, DO .  naloxone [facility]) injection 0.4 mg, 0.4 mg, Intravenous, PRN **AND** sodium chloride flush (NS) 0.9 % injection 3 mL, 3 mL, Intravenous, PRN, Trevor Iha, MD .  ondansetron (ZOFRAN) injection 4 mg, 4 mg, Intravenous, Q8H PRN, Trevor Iha, MD .  oxyCODONE (Oxy IR/ROXICODONE) immediate release tablet 10 mg, 10 mg, Oral, Q4H PRN, Doroteo Glassman, Jazma Y, DO .  oxyCODONE (Oxy IR/ROXICODONE) immediate release tablet 5 mg, 5 mg, Oral, Q4H PRN, Doroteo Glassman, Jazma Y, DO .  oxytocin (PITOCIN) IV infusion 40 units in LR 1000 mL - Premix, 2.5 Units/hr, Intravenous, Continuous, Pincus Large, DO, Stopped at 06/30/17 [id number] .  prenatal multivitamin tablet 1 tablet, 1 tablet, Oral, Q1200, Doroteo Glassman, Jazma Y, DO .  senna-docusate (Senokot-S) tablet 2 tablet, 2 tablet, Oral, Q24H, Phelps, Jazma Y, DO .  simethicone (MYLICON) chewable tablet 80 mg, 80 mg, Oral, TID PC, Pincus Large, DO, 80 mg at 06/30/17 0844 .  simethicone (MYLICON) chewable tablet 80 mg, 80 mg, Oral, Q24H, Phelps, Jazma Y, DO .  simethicone (MYLICON) chewable tablet 80 mg, 80 mg, Oral, PRN, Pincus Large, DO .  Tdap (BOOSTRIX) injection 0.5 mL, 0.5 mL, Intramuscular, Once, Phelps, Jazma Y, DO .  zolpidem (AMBIEN) tablet 5 mg, 5 mg, Oral,  QHS PRN, Pincus Largehelps, Jazma Y, DO Recent Labs    06/30/17 0005 06/30/17 0815  HGB 10.5* 9.9*  HCT 30.9* 28.9*   CMP Latest Ref Rng & Units 06/30/2017 06/28/2017 06/21/2017  Glucose 65 - 99 mg/dL - 83 72  BUN 6 - 20 mg/dL - 8 -  Creatinine 4.090.44 - 1.00 mg/dL 8.11(B1.39(H) 1.470.59 -  Sodium 135 - 145 mmol/L - 137 -  Potassium  3.5 - 5.1 mmol/L - 3.8 -  Chloride 101 - 111 mmol/L - 105 -  CO2 22 - 32 mmol/L - 22 -  Calcium 8.9 - 10.3 mg/dL - 8.8(L) -  Total Protein 6.5 - 8.1 g/dL - 6.3(L) -  Total Bilirubin 0.3 - 1.2 mg/dL - 0.5 -  Alkaline Phos 38 - 126 U/L - 150(H) -  AST 15 - 41 U/L - 33 -  ALT 14 - 54 U/L - 7(L) -     Assessment/Plan:  Patient is 22 y.o. [id number] POD#1 s/p 1LTCS at 4319w5d for FTP. S/p IOL for pre-eclampsia with severe features on MgSO4 until 24 hrs post op (~ 5pm today). With AKI, voiding spontaneously and will repeat CMP/mag level this am. Also with chorioamnionitis, s/p amp/gent. She is doing very well, recovering appropriately and complains only of minimal pain at incision site.   Continue routine post partum care MgSO4 until 5 pm today Repeat CMP/Mag today Pain meds prn Regular diet BP well controlled IUD for birth control Bottle feeding   Conan BowensKelly M Karlisha Mathena 06/30/2017, 8:47 AM

## 2017-06-30 NOTE — Anesthesia Postprocedure Evaluation (Signed)
Anesthesia Post Note  Patient: Holly Greene  Procedure(s) Performed: CESAREAN SECTION (N/A )     Patient location during evaluation: [facility] Anesthesia Type: Spinal and Epidural Level of consciousness: awake and alert, oriented and patient cooperative Pain management: pain level controlled Vital Signs Assessment: post-procedure vital signs reviewed and stable Respiratory status: spontaneous breathing Cardiovascular status: stable Postop Assessment: no headache, patient able to bend at knees, no signs of nausea or vomiting and epidural receding Anesthetic complications: no Comments: Pain score 0.    Last Vitals:  Vitals:   06/30/17 0500 06/30/17 0635  BP: 118/75 132/88  Pulse: 82 71  Resp:  18  Temp:  36.5 C  SpO2: 95%     Last Pain:  Vitals:   06/30/17 0635  TempSrc: Axillary  PainSc: Asleep   Pain Goal:                 [facility],[name]

## 2017-07-01 ENCOUNTER — Other Ambulatory Visit: Payer: Self-pay

## 2017-07-01 MED ORDER — AMLODIPINE BESYLATE 5 MG PO TABS
5.0000 mg | ORAL_TABLET | Freq: Once | ORAL | Status: AC
  Administered 2017-07-01: 5 mg via ORAL
  Filled 2017-07-01: qty 1

## 2017-07-01 MED ORDER — AMLODIPINE BESYLATE 5 MG PO TABS
5.0000 mg | ORAL_TABLET | Freq: Every day | ORAL | Status: DC
  Administered 2017-07-01: 5 mg via ORAL
  Filled 2017-07-01: qty 1

## 2017-07-01 MED ORDER — CALCIUM CARBONATE ANTACID 500 MG PO CHEW
1.0000 | CHEWABLE_TABLET | Freq: Two times a day (BID) | ORAL | Status: DC
  Administered 2017-07-01: 200 mg via ORAL
  Filled 2017-07-01 (×2): qty 1

## 2017-07-01 MED ORDER — AMLODIPINE BESYLATE 10 MG PO TABS
10.0000 mg | ORAL_TABLET | Freq: Every day | ORAL | Status: DC
  Administered 2017-07-02: 10 mg via ORAL
  Filled 2017-07-01: qty 1

## 2017-07-01 NOTE — Progress Notes (Signed)
Subjective:no headache Postpartum Day 2: Cesarean Delivery Patient reports incisional pain, tolerating PO and no problems voiding.    Objective: Vital signs in last 24 hours: Temp:  [98.2 F (36.8 C)-99 F (37.2 C)] 98.6 F (37 C) (03/25 1154) Pulse Rate:  [70-87] 75 (03/25 1154) Resp:  [17-18] 18 (03/25 1154) BP: (132-145)/(75-95) 145/95 (03/25 1154) SpO2:  [97 %-100 %] 100 % (03/25 1154)  Physical Exam:  General: alert, cooperative and no distress Lochia: appropriate Uterine Fundus: firm Incision: healing well, no significant drainage DVT Evaluation: No evidence of DVT seen on physical exam.  Recent Labs    06/30/17 0005 06/30/17 0815  HGB 10.5* 9.9*  HCT 30.9* 28.9*    Assessment/Plan: Status post Cesarean section. Postoperative course complicated by hypertension, will add amlodipine  .  Holly DarterJames Greene 07/01/2017, 12:41 PM

## 2017-07-02 ENCOUNTER — Other Ambulatory Visit: Payer: Self-pay | Admitting: Obstetrics & Gynecology

## 2017-07-02 DIAGNOSIS — G8918 Other acute postprocedural pain: Secondary | ICD-10-CM

## 2017-07-02 MED ORDER — AMLODIPINE BESYLATE 10 MG PO TABS: 10.0000 mg | ORAL_TABLET | Freq: Every day | ORAL | 1 refills | Status: DC

## 2017-07-02 MED ORDER — OXYCODONE HCL 5 MG PO TABS: 5.0000 mg | ORAL_TABLET | ORAL | 0 refills | Status: DC | PRN

## 2017-07-02 MED ORDER — IBUPROFEN 600 MG PO TABS: 600.0000 mg | ORAL_TABLET | Freq: Four times a day (QID) | ORAL | 0 refills | Status: DC

## 2017-07-02 NOTE — Discharge Instructions (Signed)
Cesarean Delivery, Care After °Refer to this sheet in the next few weeks. These instructions provide you with information about caring for yourself after your procedure. Your health care provider may also give you more specific instructions. Your treatment has been planned according to current medical practices, but problems sometimes occur. Call your health care provider if you have any problems or questions after your procedure. °What can I expect after the procedure? °After the procedure, it is common to have: °· A small amount of blood or clear fluid coming from the incision. °· Some redness, swelling, and pain in your incision area. °· Some abdominal pain and soreness. °· Vaginal bleeding (lochia). °· Pelvic cramps. °· Fatigue. ° °Follow these instructions at home: °Incision care ° °· Follow instructions from your health care provider about how to take care of your incision. Make sure you: °? Wash your hands with soap and water before you change your bandage (dressing). If soap and water are not available, use hand sanitizer. °? If you have a dressing, change it as told by your health care provider. °? Leave stitches (sutures), skin staples, skin glue, or adhesive strips in place. These skin closures may need to stay in place for 2 weeks or longer. If adhesive strip edges start to loosen and curl up, you may trim the loose edges. Do not remove adhesive strips completely unless your health care provider tells you to do that. °· Check your incision area every day for signs of infection. Check for: °? More redness, swelling, or pain. °? More fluid or blood. °? Warmth. °? Pus or a bad smell. °· When you cough or sneeze, hug a pillow. This helps with pain and decreases the chance of your incision opening up (dehiscing). Do this until your incision heals. °Medicines °· Take over-the-counter and prescription medicines only as told by your health care provider. °· If you were prescribed an antibiotic medicine, take it  as told by your health care provider. Do not stop taking the antibiotic until it is finished. °Driving °· Do not drive or operate heavy machinery while taking prescription pain medicine. °Lifestyle °· Do not drink alcohol. This is especially important if you are breastfeeding or taking pain medicine. °· Do not use tobacco products, including cigarettes, chewing tobacco, or e-cigarettes. If you need help quitting, ask your health care provider. Tobacco can delay wound healing. °Eating and drinking °· Drink at least 8 eight-ounce glasses of water every day unless told not to by your health care provider. If you breastfeed, you may need to drink more water than this. °· Eat high-fiber foods every day. These foods may help prevent or relieve constipation. High-fiber foods include: °? Whole grain cereals and breads. °? Brown rice. °? Beans. °? Fresh fruits and vegetables. °Activity °· Return to your normal activities as told by your health care provider. Ask your health care provider what activities are safe for you. °· Rest as much as possible. Try to rest or take a nap while your baby is sleeping. °· Do not lift anything that is heavier than your baby or 10 lb (4.5 kg) as told by your health care provider. °· Ask your health care provider when you can engage in sexual activity. This may depend on your: °? Risk of infection. °? Healing rate. °? Comfort and desire to engage in sexual activity. °Bathing °· Do not take baths, swim, or use a hot tub until your health care provider approves. Ask your health care provider if   you can take showers. You may only be allowed to take sponge baths until your incision heals. °General instructions °· Do not use tampons or douches until your health care provider approves. °· Wear: °? Loose, comfortable clothing. °? A supportive and well-fitting bra. °· Watch for any blood clots that may pass from your vagina. These may look like clumps of dark red, brown, or black discharge. °· Keep  your perineum clean and dry as told by your health care provider. °· Wipe from front to back when you use the toilet. °· If possible, have someone help you care for your baby and help with household activities for a few days after you leave the hospital. °· Keep all follow-up visits for you and your baby as told by your health care provider. This is important. °Contact a health care provider if: °· You have: °? Bad-smelling vaginal discharge. °? Difficulty urinating. °? Pain when urinating. °? A sudden increase or decrease in the frequency of your bowel movements. °? More redness, swelling, or pain around your incision. °? More fluid or blood coming from your incision. °? Pus or a bad smell coming from your incision. °? A fever. °? A rash. °? Little or no interest in activities you used to enjoy. °? Questions about caring for yourself or your baby. °? Nausea. °· Your incision feels warm to the touch. °· Your breasts turn red or become painful or hard. °· You feel unusually sad or worried. °· You vomit. °· You pass large blood clots from your vagina. If you pass a blood clot, save it to show to your health care provider. Do not flush blood clots down the toilet without showing your health care provider. °· You urinate more than usual. °· You are dizzy or light-headed. °· You have not breastfed and have not had a menstrual period for 12 weeks after delivery. °· You stopped breastfeeding and have not had a menstrual period for 12 weeks after stopping breastfeeding. °Get help right away if: °· You have: °? Pain that does not go away or get better with medicine. °? Chest pain. °? Difficulty breathing. °? Blurred vision or spots in your vision. °? Thoughts about hurting yourself or your baby. °? New pain in your abdomen or in one of your legs. °? A severe headache. °· You faint. °· You bleed from your vagina so much that you fill two sanitary pads in one hour. °This information is not intended to replace advice given to  you by your health care provider. Make sure you discuss any questions you have with your health care provider. °Document Released: 12/16/2001 Document Revised: 04/28/2016 Document Reviewed: 02/28/2015 °Elsevier Interactive Patient Education © 2018 [employer] ° ° °Hypertension During Pregnancy °Hypertension is also called high blood pressure. High blood pressure means that the force of your blood moving in your body is too strong. When you are pregnant, this condition should be watched carefully. It can cause problems for you and your baby. °Follow these instructions at home: °Eating and drinking °· Drink enough fluid to keep your pee (urine) clear or pale yellow. °· Eat healthy foods that are low in salt (sodium). °? Do not add salt to your food. °? Check labels on foods and drinks to see much salt is in them. Look on the label where you see "Sodium." °Lifestyle °· Do not use any products that contain nicotine or tobacco, such as cigarettes and e-cigarettes. If you need help quitting, ask your doctor. °·   Do not use alcohol. °· Avoid caffeine. °· Avoid stress. Rest and get plenty of sleep. °General instructions °· Take over-the-counter and prescription medicines only as told by your doctor. °· While lying down, lie on your left side. This keeps pressure off your baby. °· While sitting or lying down, raise (elevate) your feet. Try putting some pillows under your lower legs. °· Exercise regularly. Ask your doctor what kinds of exercise are best for you. °· Keep all prenatal and follow-up visits as told by your doctor. This is important. °Contact a doctor if: °· You have symptoms that your doctor told you to watch for, such as: °? Fever. °? Throwing up (vomiting). °? Headache. °Get help right away if: °· You have very bad pain in your belly (abdomen). °· You are throwing up, and this does not get better with treatment. °· You suddenly get swelling in your hands, ankles, or face. °· You gain 4 lb (1.8 kg) or more in 1  week. °· You get bleeding from your vagina. °· You have blood in your pee. °· You do not feel your baby moving as much as normal. °· You have a change in vision. °· You have muscle twitching or sudden tightening (spasms). °· You have trouble breathing. °· Your lips or fingernails turn blue. °This information is not intended to replace advice given to you by your health care provider. Make sure you discuss any questions you have with your health care provider. °Document Released: 04/28/2010 Document Revised: 12/06/2015 Document Reviewed: 12/06/2015 °Elsevier Interactive Patient Education © 2018 [employer] ° °

## 2017-07-02 NOTE — Discharge Summary (Signed)
OB Discharge Summary     Patient Name: Holly Greene DOB: 04-26-1995 MRN: [id number]  Date of admission: 06/28/2017 Delivering MD: Catalina Antigua   Date of discharge: 07/02/2017  Admitting diagnosis: 38WKS HEADACHE, SWOLLEN ANKLES Intrauterine pregnancy: [redacted]w[redacted]d     Secondary diagnosis:  Active Problems:   Preeclampsia   Antepartum chorioamnionitis, third trimester, fetus 1   Status post primary low transverse cesarean section  Additional problems: none     Discharge diagnosis: Term Pregnancy Delivered and Preeclampsia (severe)                                                                                                Post partum procedures:none  Augmentation: AROM, Pitocin and Cytotec  Complications: Intrauterine Inflammation or infection (Chorioamniotis)  Hospital course:  Induction of Labor With Cesarean Section  22 y.o. yo G2P2002 at [redacted]w[redacted]d was admitted to the hospital 06/28/2017 for induction of labor. Patient had a labor course significant for active phase arrest andn chorioamnionitis. The patient went for cesarean section due to Arrest of Dilation, and delivered a Viable infant,06/29/2017  Membrane Rupture Time/Date: 3:25 AM ,06/29/2017   Details of operation can be found in separate operative Note.  Patient had an uncomplicated postpartum course. She is ambulating, tolerating a regular diet, passing flatus, and urinating well.  Patient is discharged home in stable condition on 07/02/17.                                    Physical exam  Vitals:   07/01/17 1940 07/02/17 0002 07/02/17 0420 07/02/17 0800  BP: 137/90 139/84 (!) 145/90 (!) 142/82  Pulse: 80 85 86 77  Resp: 17 17 19 16   Temp: 99.4 F (37.4 C) 98.6 F (37 C) 99.2 F (37.3 C) 98.2 F (36.8 C)  TempSrc: Oral Oral Oral Oral  SpO2: 96% 99% 100% 99%  Weight:      Height:       General: alert, cooperative and no distress Lochia: appropriate Uterine Fundus: firm Incision: Healing well with no significant  drainage DVT Evaluation: No evidence of DVT seen on physical exam. Labs: Lab Results  Component Value Date   WBC 21.6 (H) 06/30/2017   HGB 9.9 (L) 06/30/2017   HCT 28.9 (L) 06/30/2017   MCV 90.3 06/30/2017   PLT 188 06/30/2017   CMP Latest Ref Rng & Units 06/30/2017  Glucose 65 - 99 mg/dL 188(C)  BUN 6 - 20 mg/dL 8  Creatinine 1.66 - 0.63 mg/dL 0.16  Sodium 010 - 932 mmol/L 130(L)  Potassium 3.5 - 5.1 mmol/L 4.3  Chloride 101 - 111 mmol/L 99(L)  CO2 22 - 32 mmol/L 21(L)  Calcium 8.9 - 10.3 mg/dL 8.0(L)  Total Protein 6.5 - 8.1 g/dL 3.5(T)  Total Bilirubin 0.3 - 1.2 mg/dL 0.7  Alkaline Phos 38 - 126 U/L 123  AST 15 - 41 U/L 35  ALT 14 - 54 U/L 7(L)    Discharge instruction: per After Visit Summary and "Baby and Me Booklet".  After visit meds:  Allergies as of 07/02/2017   No Known Allergies     Medication List    STOP taking these medications   albuterol 108 (90 Base) MCG/ACT inhaler Commonly known as:  PROVENTIL HFA;VENTOLIN HFA   ondansetron 4 MG tablet Commonly known as:  ZOFRAN   ranitidine 150 MG tablet Commonly known as:  ZANTAC     TAKE these medications   amLODipine 10 MG tablet Commonly known as:  NORVASC Take 1 tablet (10 mg total) by mouth daily.   ibuprofen 600 MG tablet Commonly known as:  ADVIL,MOTRIN Take 1 tablet (600 mg total) by mouth every 6 (six) hours.   oxyCODONE 5 MG immediate release tablet Commonly known as:  Oxy IR/ROXICODONE Take 1-2 tablets (5-10 mg total) by mouth every 4 (four) hours as needed (pain scale 4-7).   PRENATE PIXIE 10-0.6-0.4-200 MG Caps Take 1 tablet by mouth daily.       Diet: routine diet  Activity: Advance as tolerated. Pelvic rest for 6 weeks.   Outpatient follow up:2 days for BP and wound check Follow up Appt: Future Appointments  Date Time Provider Department Center  07/29/2017  1:00 PM Constant, Gigi GinPeggy, MD [facility] None   Follow up Visit:No follow-ups on file.  Postpartum contraception: Not  Discussed  Newborn Data: Live born female  Birth Weight: 8 lb 6.2 oz (3805 g) APGAR: 8, 9  Newborn Delivery   Birth date/time:  06/29/2017 21:49:00 Delivery type:  C-Section, Low Transverse C-section categorization:  Primary     Baby Feeding: Bottle Disposition:home with mother   07/02/2017 [name], MD

## 2017-07-02 NOTE — Progress Notes (Signed)
Patient discharged with printed instructions. Pt verbalized an understanding. No concerns noted. Dionte Blaustein L Rielyn Krupinski, RN 

## 2017-07-04 ENCOUNTER — Encounter: Payer: Self-pay | Admitting: Obstetrics

## 2017-07-04 ENCOUNTER — Ambulatory Visit: Payer: Medicaid Other | Admitting: Obstetrics

## 2017-07-04 DIAGNOSIS — O1414 Severe pre-eclampsia complicating childbirth: Secondary | ICD-10-CM

## 2017-07-04 NOTE — Progress Notes (Signed)
Pt presents for Blood Pressure/ Incision  Check today.  Pt states a stitch is lifting up   Denies any headaches or visual changes.

## 2017-07-04 NOTE — Progress Notes (Signed)
Subjective:     Holly Greene is a [age] y.o. female who presents for a postpartum visit. She is 1 week postpartum following a low cervical transverse Cesarean section. I have fully reviewed the prenatal and intrapartum course. The delivery was at 38 gestational weeks. Outcome: primary cesarean section, low transverse incision. Anesthesia: epidural. Postpartum course has been complicated by elevated BP. Baby's course has been normal. Baby is feeding by breast. Bleeding thin lochia. Bowel function is normal. Bladder function is normal. Patient is not sexually active. Contraception method is abstinence. Postpartum depression screening: negative.  Tobacco, alcohol and substance abuse history reviewed.  Adult immunizations reviewed including TDAP, rubella and varicella.  The following portions of the patient's history were reviewed and updated as appropriate: allergies, current medications, past family history, past medical history, past social history, past surgical history and problem list.  Review of Systems A comprehensive review of systems was negative.   Objective:    BP 136/88   Pulse 89   Temp 98.1 F (36.7 C) (Oral)   Wt 185 lb (83.9 kg)   LMP 10/26/2016 (Approximate)   BMI 28.98 kg/m   General:  alert and no distress   Breasts:  inspection negative, no nipple discharge or bleeding, no masses or nodularity palpable  Lungs: clear to auscultation bilaterally  Heart:  regular rate and rhythm, S1, S2 normal, no murmur, click, rub or gallop  Abdomen: soft, non-tender; bowel sounds normal; no masses,  no organomegaly and incision is C, D, I.    50% of 15 min visit spent on counseling and coordination of care.   Assessment:     1. Postpartum care following cesarean delivery - doing well  2. Pre-eclampsia, severe, delivered - BP stable on Norvasc   Plan:    1. Contraception: abstinence 2. Continue Norvasc 3. Follow up in: 1 week or as needed.   Brock BadHARLES A. Kateryn Marasigan MD 06-10-2017

## 2017-07-18 ENCOUNTER — Ambulatory Visit: Payer: Medicaid Other | Admitting: Obstetrics

## 2017-07-19 ENCOUNTER — Telehealth: Payer: Self-pay | Admitting: *Deleted

## 2017-07-19 NOTE — Telephone Encounter (Signed)
Lft vmail for patient to callback and reschedule missed PP visit on 07/18/17.Marland Kitchen.Marland Kitchen..Marland Kitchen

## 2017-07-22 ENCOUNTER — Ambulatory Visit (INDEPENDENT_AMBULATORY_CARE_PROVIDER_SITE_OTHER): Payer: Medicaid Other | Admitting: Obstetrics

## 2017-07-22 ENCOUNTER — Encounter: Payer: Self-pay | Admitting: Obstetrics

## 2017-07-22 DIAGNOSIS — O1414 Severe pre-eclampsia complicating childbirth: Secondary | ICD-10-CM

## 2017-07-22 NOTE — Progress Notes (Signed)
Patient ID: Holly Greene, female   DOB: October 03, 1995, 22 y.o.   MRN: [id number]  Chief Complaint  Patient presents with  . Follow-up    Incision check;BP check  . Letter for School/Work    Release to go back to work    HPI Holly CotaJalasia Mealor is a 22 y.o. female.  History of pre-eclampsia and delivery by C/S.  Presents for BP check and incision check.  No complaints. HPI  Past Medical History:  Diagnosis Date  . Asthma    as a child  . Bipolar 1 disorder (HCC)   . Defiant behavior   . Eczema   . Hypertension     Past Surgical History:  Procedure Laterality Date  . CESAREAN SECTION N/A 06/29/2017   Procedure: CESAREAN SECTION;  Surgeon: Catalina Antiguaonstant, Peggy, MD;  Location: [facility];  Service: Obstetrics;  Laterality: N/A;  . TONSILLECTOMY      Family History  Problem Relation Age of Onset  . Hypertension Mother     Social History Social History   Tobacco Use  . Smoking status: Never Smoker  . Smokeless tobacco: Never Used  Substance Use Topics  . Alcohol use: No  . Drug use: No    Comment: smoked a week ago    No Known Allergies  Current Outpatient Medications  Medication Sig Dispense Refill  . ibuprofen (ADVIL,MOTRIN) 600 MG tablet Take 1 tablet (600 mg total) by mouth every 6 (six) hours. 30 tablet 0  . oxyCODONE (OXY IR/ROXICODONE) 5 MG immediate release tablet Take 1-2 tablets (5-10 mg total) by mouth every 4 (four) hours as needed (pain scale 4-7). 30 tablet 0  . Prenat-FeAsp-Meth-FA-DHA w/o A (PRENATE PIXIE) 10-0.6-0.4-200 MG CAPS Take 1 tablet by mouth daily. 30 capsule 12  . amLODipine (NORVASC) 10 MG tablet Take 1 tablet (10 mg total) by mouth daily. (Patient not taking: Reported on 07/22/2017) 20 tablet 1   No current facility-administered medications for this visit.     Review of Systems Review of Systems Constitutional: negative for fatigue and weight loss Respiratory: negative for cough and wheezing Cardiovascular: negative for chest pain,  fatigue and palpitations Gastrointestinal: negative for abdominal pain and change in bowel habits Genitourinary:negative Integument/breast: negative for nipple discharge Musculoskeletal:negative for myalgias Neurological: negative for gait problems and tremors Behavioral/Psych: negative for abusive relationship, depression Endocrine: negative for temperature intolerance      Blood pressure 136/84, pulse 65, resp. rate 16, weight 177 lb 3.2 oz (80.4 kg), last menstrual period 06/29/2017, not currently breastfeeding.  Physical Exam Physical Exam General:   alert  Skin:   no rash or abnormalities  Lungs:   clear to auscultation bilaterally  Heart:   regular rate and rhythm, S1, S2 normal, no murmur, click, rub or gallop  Breasts:   normal without suspicious masses, skin or nipple changes or axillary nodes  Abdomen:  normal findings: no organomegaly, soft, non-tender and no hernia Incision is clean, dry, and intact.  Non tender.  50% of 15 min visit spent on counseling and coordination of care.   Data Reviewed Op Note  Assessment     1. Postpartum care following cesarean delivery  2. Pre-eclampsia, severe, delivered - stable BP, but has not taken BP med for past 3 days     Plan    Continue Norvasc  Follow up in 2 weeks   No orders of the defined types were placed in this encounter.  No orders of the defined types were placed in this encounter.  Brock Bad MD 07-22-2017

## 2017-07-29 ENCOUNTER — Ambulatory Visit: Payer: Self-pay | Admitting: Obstetrics

## 2017-07-29 ENCOUNTER — Ambulatory Visit: Payer: Self-pay | Admitting: Obstetrics and Gynecology

## 2017-07-30 ENCOUNTER — Encounter (INDEPENDENT_AMBULATORY_CARE_PROVIDER_SITE_OTHER): Payer: Self-pay

## 2017-08-18 ENCOUNTER — Encounter (HOSPITAL_COMMUNITY): Payer: Self-pay | Admitting: Obstetrics and Gynecology

## 2018-04-13 IMAGING — US US MFM FETAL NUCHAL TRANSLUCENCY
1 series · 10 of 10 positions shown · non-contrast
Comparison: none

[Series 1: us mfm fetal nuchal translucency · 10 of 10 slices shown]
[im 1/10]
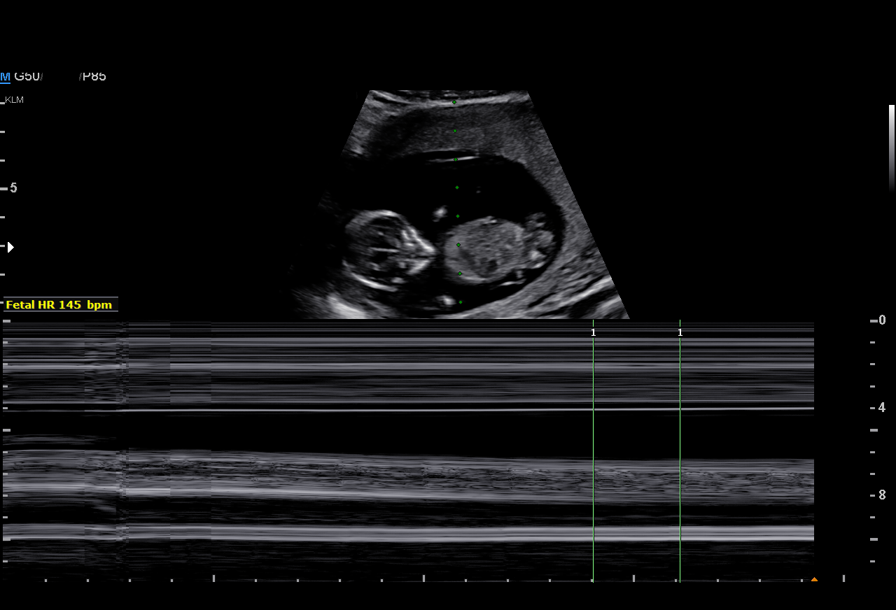
[im 2/10]
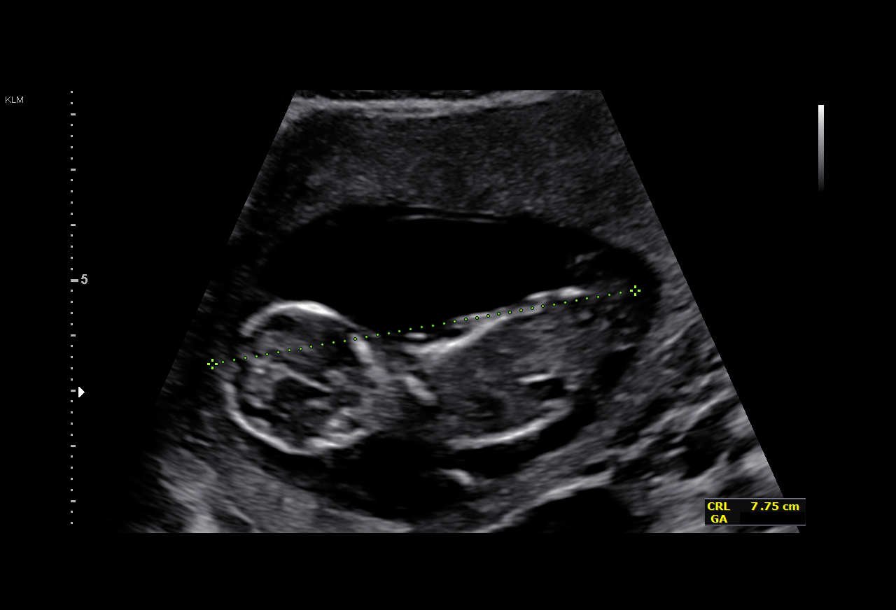
[im 3/10]
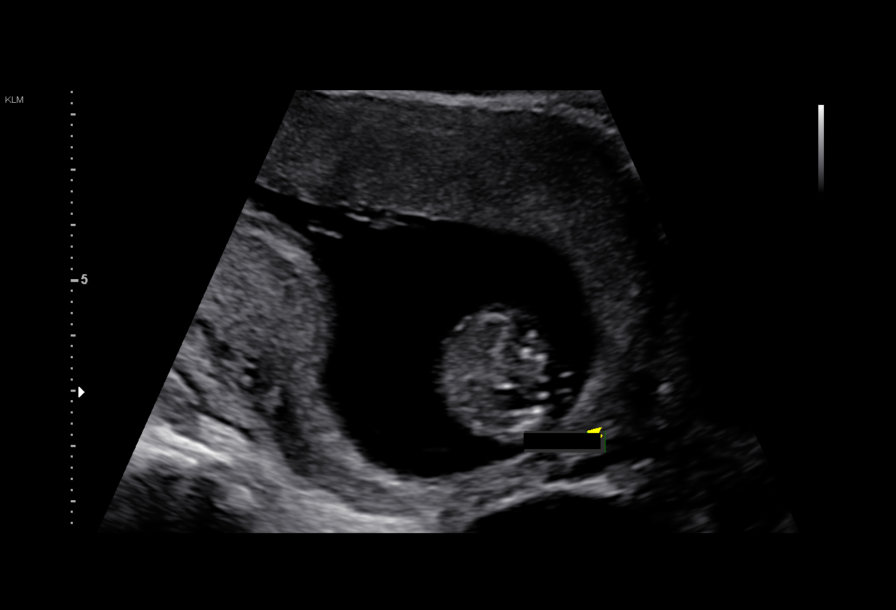
[im 4/10]
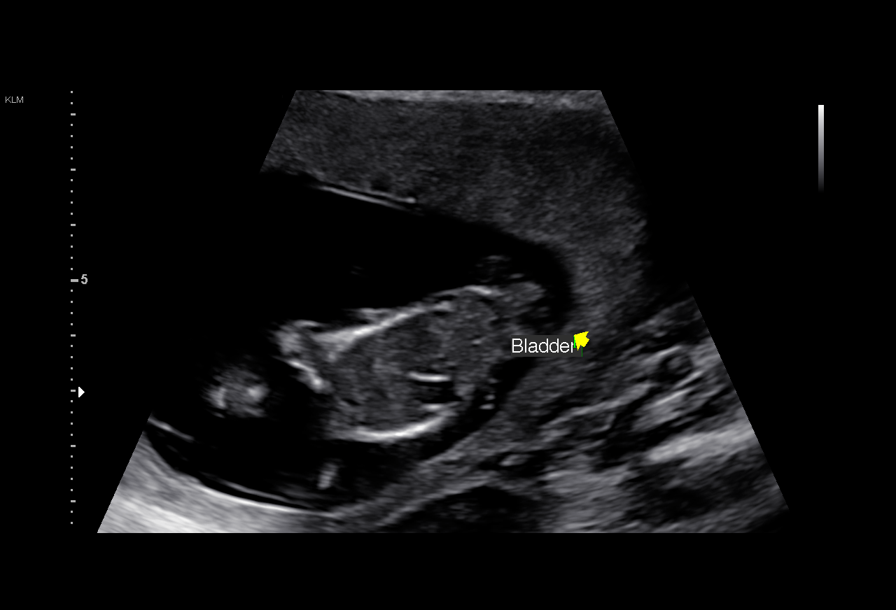
[im 5/10]
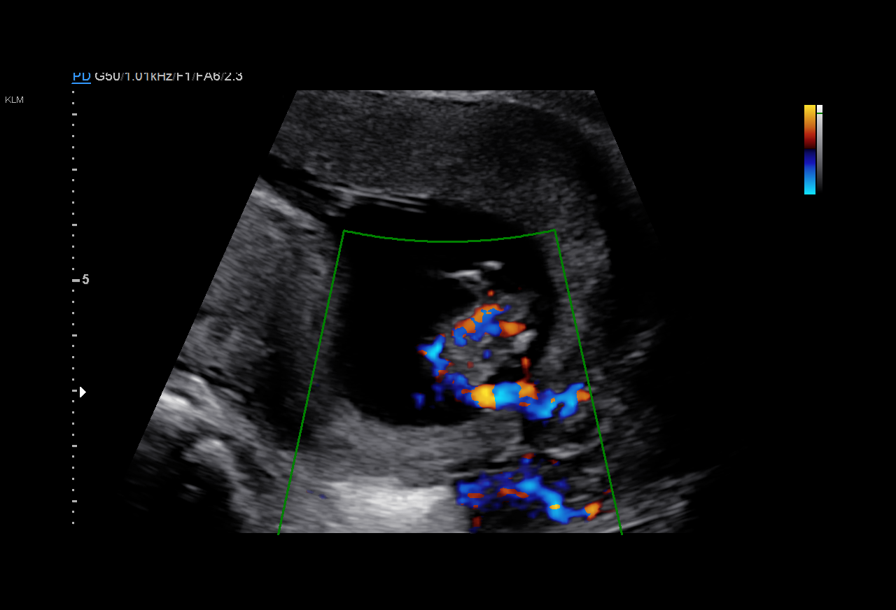
[im 6/10]
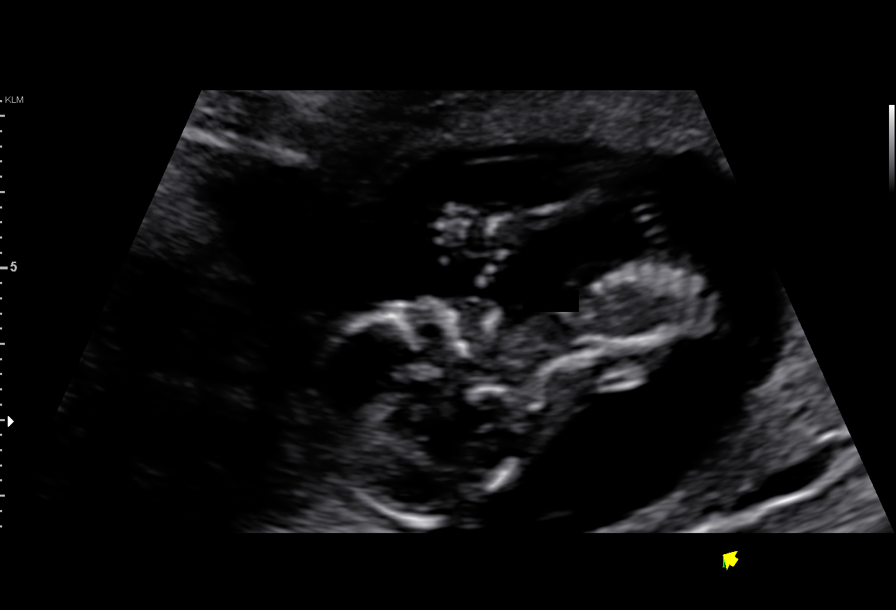
[im 7/10]
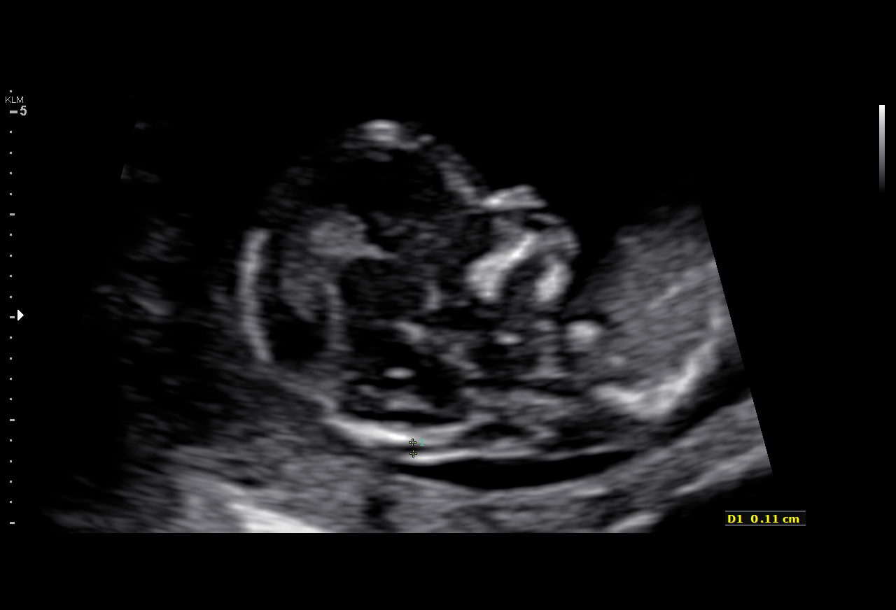
[im 8/10]
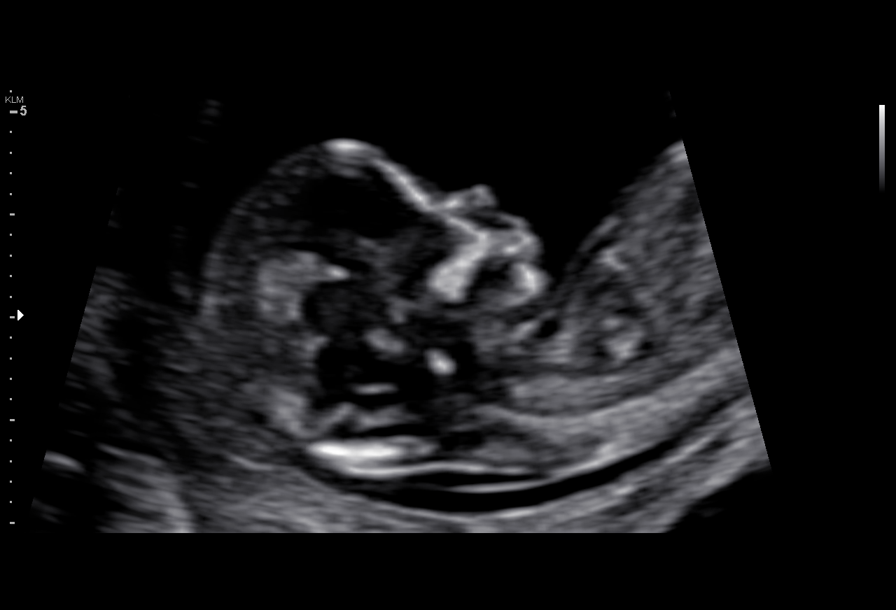
[im 9/10]
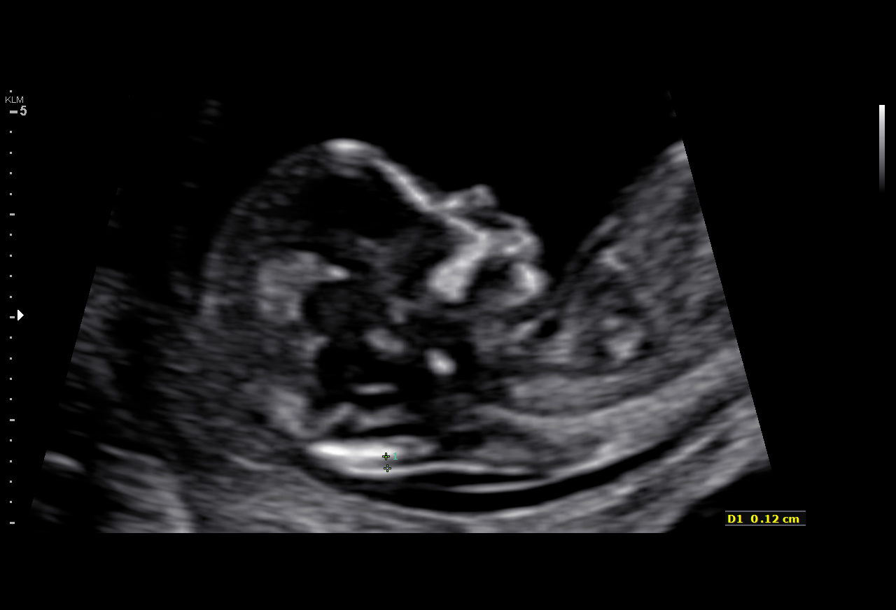
[im 10/10]
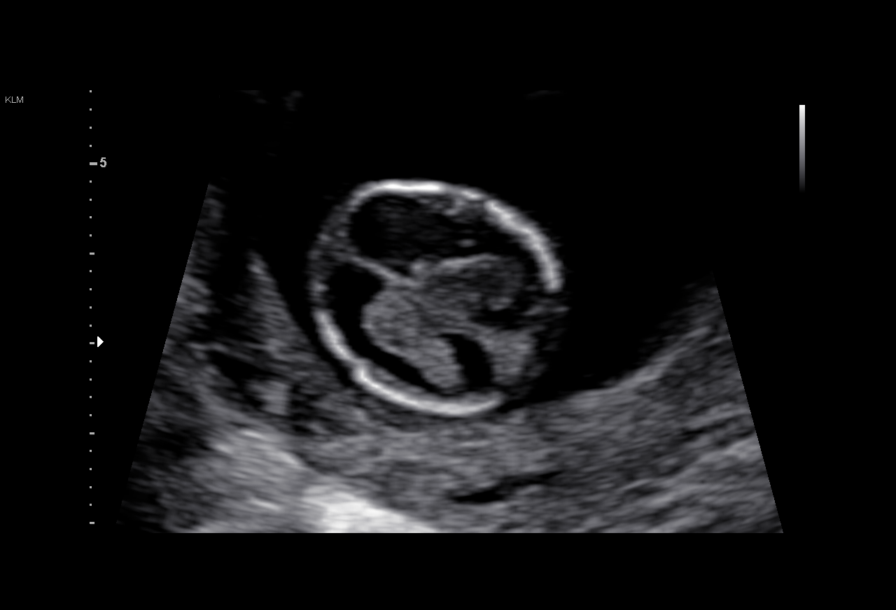

[10 of 10 positions shown; findings below may reference images not displayed]

TRANSLUCENCY

1  LIMWENA SAWAHENGA              [id number]      [id number]     [id number]
Indications

13 weeks gestation of pregnancy
Encounter for nuchal translucency
OB History

Blood Type:            Height:  5'8"   Weight (lb):  183      BMI:
Gravidity:    1         Term:   0        Prem:   0        SAB:   0
TOP:          0       Ectopic:  0        Living: 0
Fetal Evaluation

Num Of Fetuses:     1
Fetal Heart         145
Rate(bpm):
Cardiac Activity:   Observed
Presentation:       Variable

Amniotic Fluid
AFI FV:      Subjectively within normal limits
Gestational Age

LMP:           13w 3d       Date:   10/31/15                 EDD:   08/06/16
Best:          13w 3d    Det. By:   LMP  (10/31/15)          EDD:   08/06/16
1st Trimester Genetic Sonogram Screening

CRL:            77.5  mm    G. Age:   13w 4d                 EDD:   08/05/16
Nuc Trans:       1.2  mm
Nasal Bone:                 Present
Impression

Single IUP at 13w 3d
Normal NT (1.2 mm).  Nasal bone visualized.
First trimester aneuploidy screen performed as noted above.
Please do not draw triple/quad screen, though patient should
be offered MSAFP for neural tube defect screening.
Recommendations

Recommend ultrasound for fetal anatomy at 18-20 weeks
gestation

## 2018-06-21 IMAGING — US US MFM OB COMP +14 WKS
1 series · 14 of 28 positions shown · non-contrast
Comparison: none

[Series 1: us mfm ob comp +14 wks · 14 of 74 slices shown]
[im 3/74]
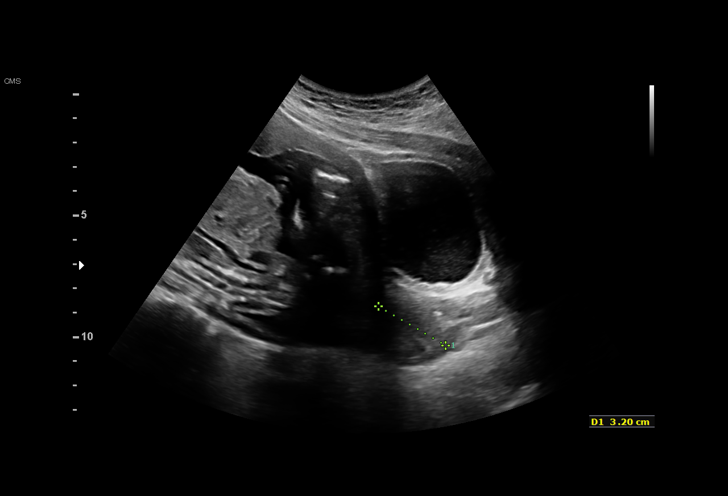
[im 9/74]
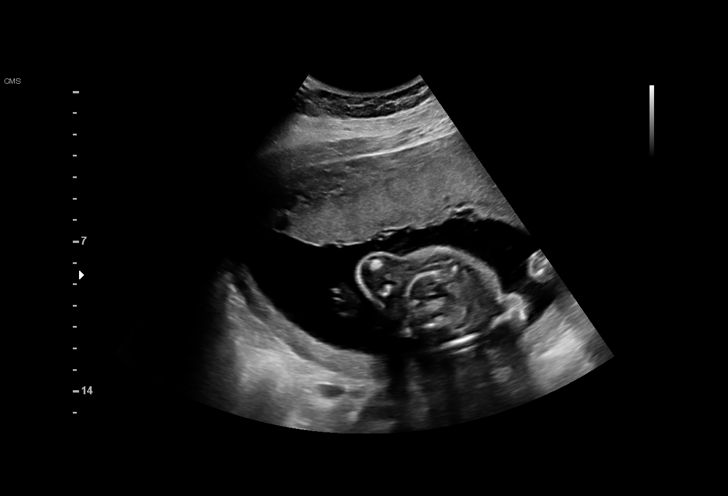
[im 14/74]
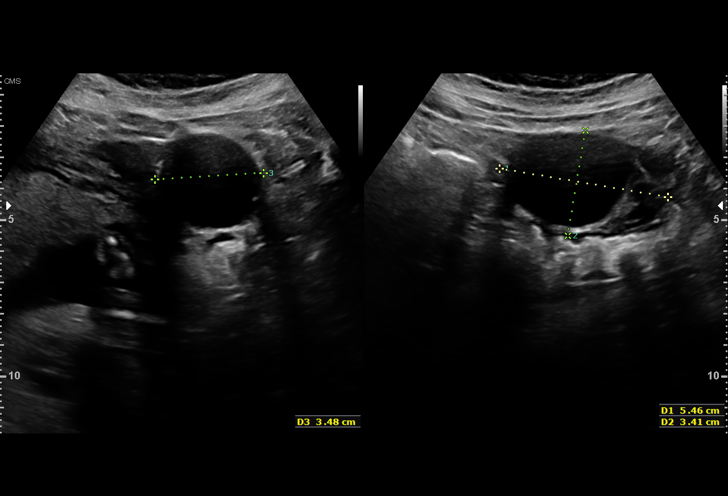
[im 19/74]
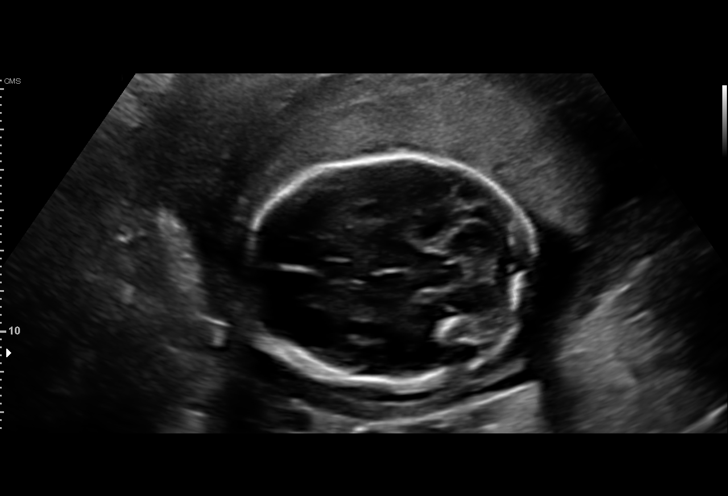
[im 25/74]
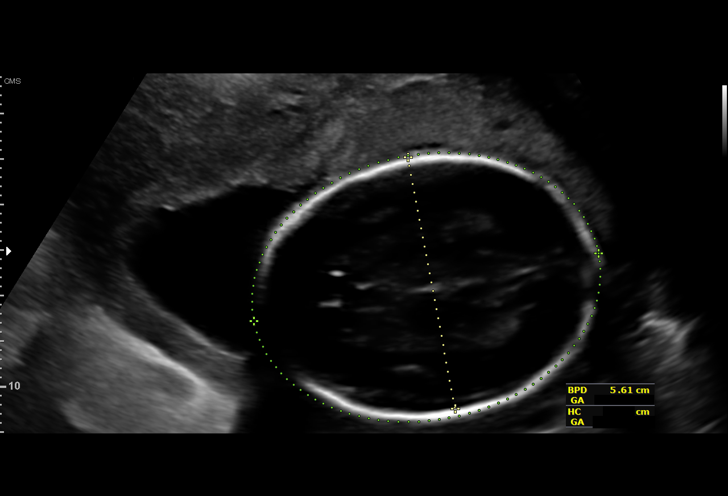
[im 30/74]
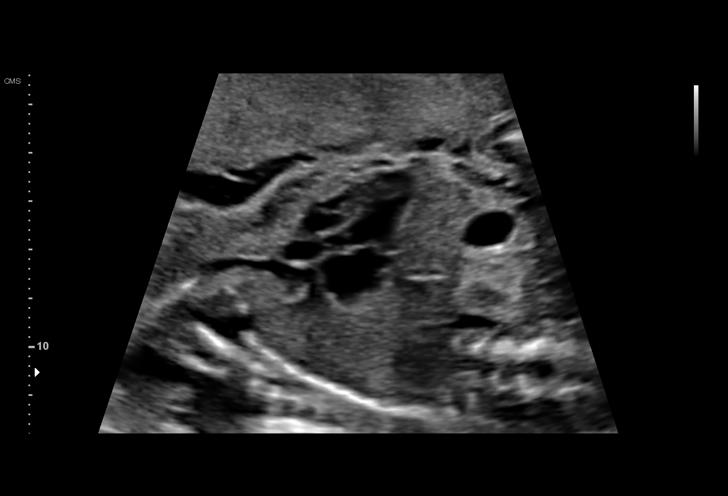
[im 36/74]
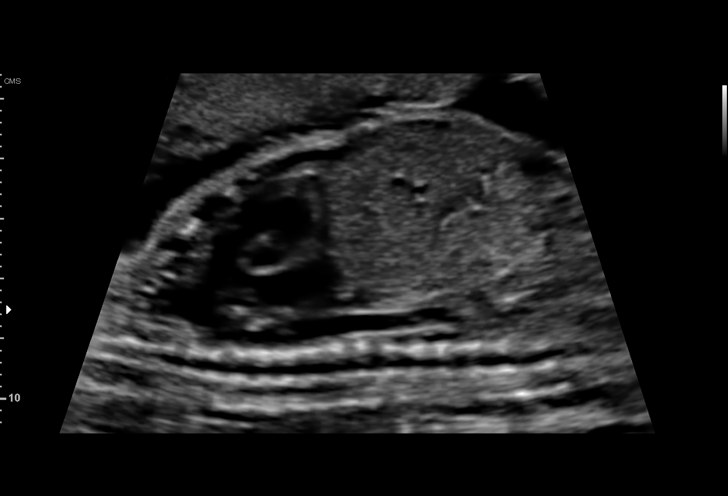
[im 41/74]
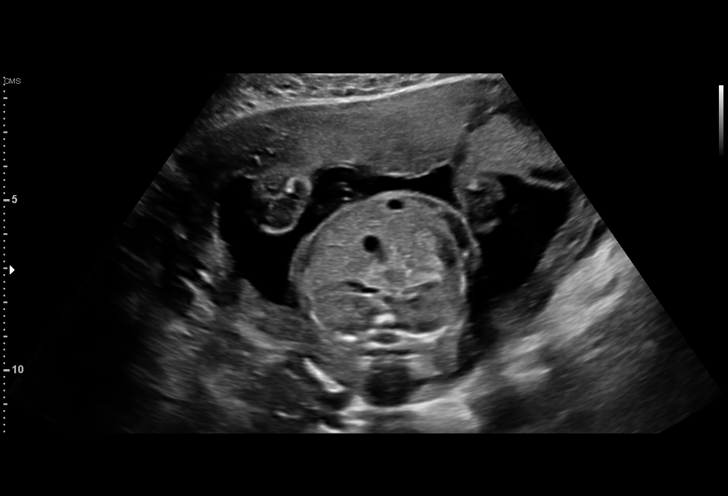
[im 46/74]
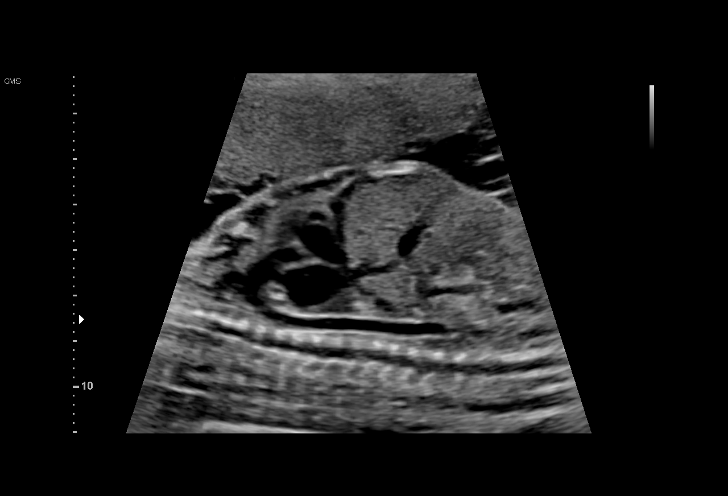
[im 52/74]
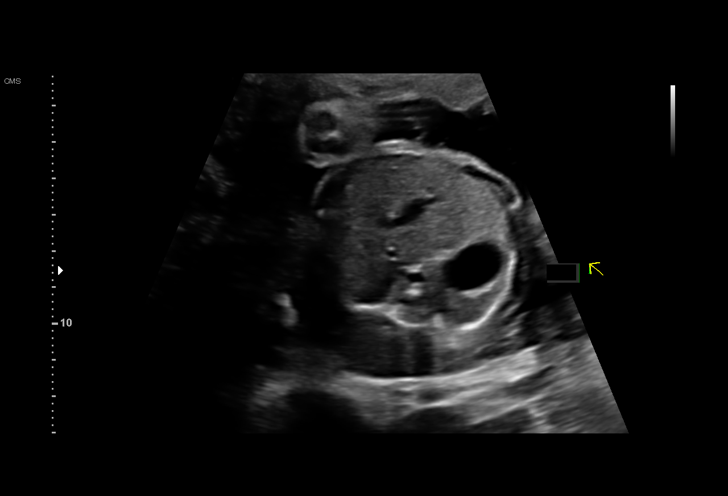
[im 57/74]
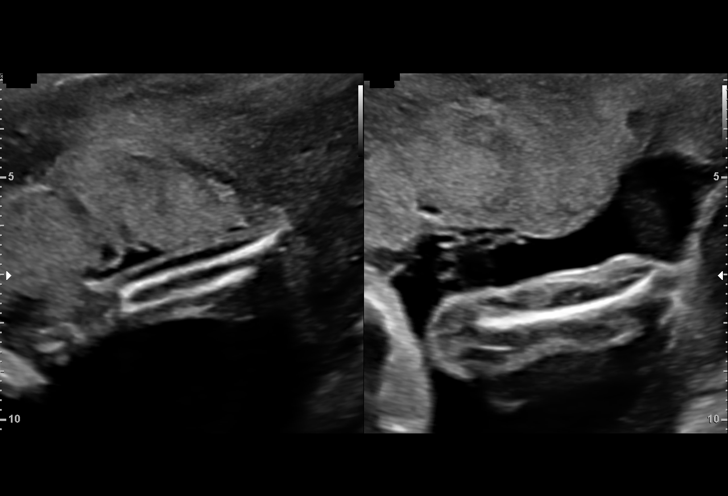
[im 63/74]
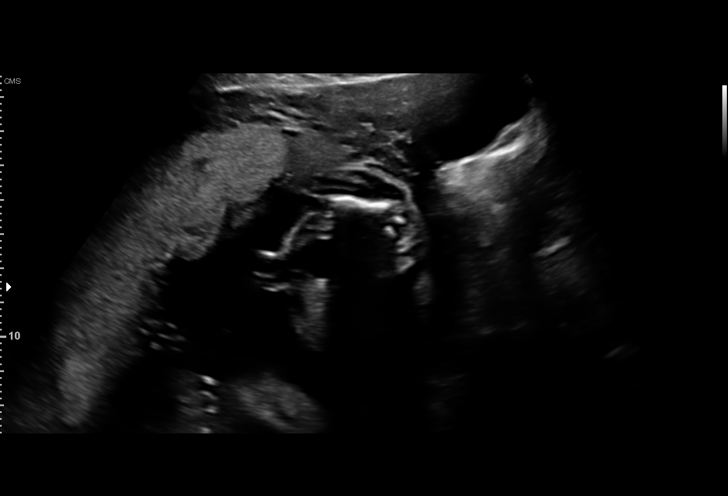
[im 68/74]
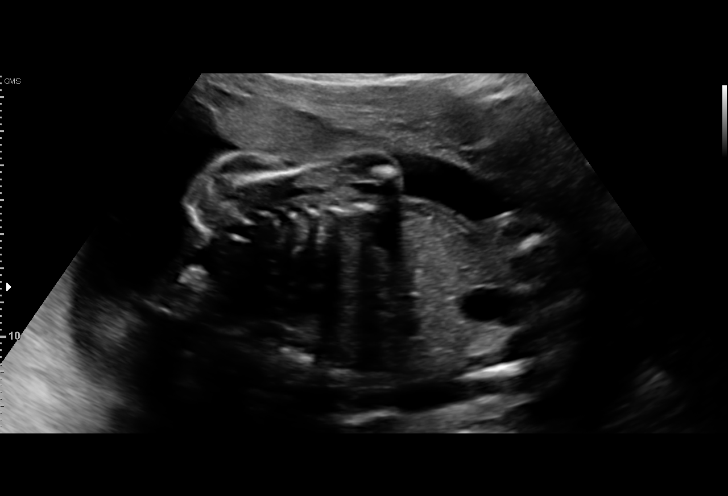
[im 74/74]
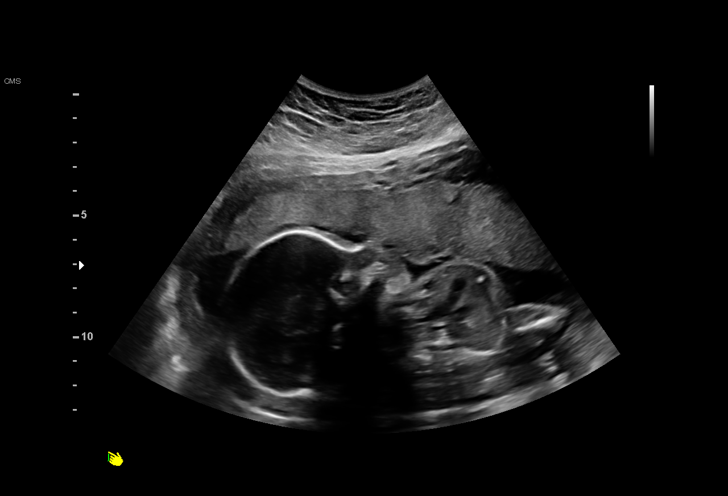

[14 of 28 positions shown; findings below may reference images not displayed]

1  RAMATLOTLA SPEAKS           [id number]      [id number]     [id number]
Indications

23 weeks gestation of pregnancy
Encounter for antenatal screening for
malformations
OB History

Blood Type:            Height:  5'8"   Weight (lb):  183       BMI:
Gravidity:    1         Term:   0        Prem:   0         SAB:   0
TOP:          0       Ectopic:  0        Living: 0
Fetal Evaluation

Num Of Fetuses:     1
Fetal Heart         137
Rate(bpm):
Cardiac Activity:   Observed
Presentation:       Breech
Placenta:           Anterior, above cervical os
P. Cord Insertion:  Visualized, central

Amniotic Fluid
AFI FV:      Subjectively within normal limits

Largest Pocket(cm)
4.86
Biometry

BPD:        56  mm     G. Age:  23w 1d         37  %    CI:         70.23  %    70 - 86
FL/HC:       18.9  %    19.2 -
HC:      213.1  mm     G. Age:  23w 3d         38  %    HC/AC:       1.17       1.05 -
AC:        182  mm     G. Age:  23w 0d         34  %    FL/BPD:      71.8  %    71 - 87
FL:       40.2  mm     G. Age:  23w 0d         29  %    FL/AC:       22.1  %    20 - 24
HUM:      38.3  mm     G. Age:  23w 4d         47  %
CER:      26.4  mm     G. Age:  24w 1d         65  %

CM:        4.2  mm

Est. FW:     558   gm     1 lb 4 oz     49  %
Gestational Age

LMP:           23w 2d        Date:  10/31/15                 EDD:    08/06/16
U/S Today:     23w 1d                                        EDD:    08/07/16
Best:          23w 2d     Det. By:  LMP  (10/31/15)          EDD:    08/06/16
Anatomy

Cranium:               Appears normal         Aortic Arch:            Appears normal
Cavum:                 Appears normal         Ductal Arch:            Appears normal
Ventricles:            Appears normal         Diaphragm:              Appears normal
Choroid Plexus:        Appears normal         Stomach:                Appears normal, left
sided
Cerebellum:            Appears normal         Abdomen:                Appears normal
Posterior Fossa:       Appears normal         Abdominal Wall:         Appears nml (cord
insert, abd wall)
Nuchal Fold:           Not applicable (>20    Cord Vessels:           Appears normal (3
wks GA)                                        vessel cord)
Face:                  Appears normal         Kidneys:                Appear normal
(orbits and profile)
Lips:                  Appears normal         Bladder:                Appears normal
Thoracic:              Appears normal         Spine:                  Limited views
appear normal
Heart:                 Appears normal         Upper Extremities:      Appears normal
(4CH, axis, and
situs)
RVOT:                  Appears normal         Lower Extremities:      Appears normal
LVOT:                  Appears normal

Other:  Fetus appears to be a female. Heels visualized. Nasal bone
visualized. Technically difficult due to fetal position.
Cervix Uterus Adnexa

Cervix
Length:            3.2  cm.
Normal appearance by transabdominal scan.

Uterus
No abnormality visualized.

Left Ovary
Simple cyst measuring 4.0 x 3.2 x 3.1 cm.

Right Ovary
Within normal limits.

Cul De Sac:   No free fluid seen.

Adnexa:       No abnormality visualized.
Impression

SIUP at 23+2 weeks
Normal detailed fetal anatomy
Normal amniotic fluid volume
Measurements consistent with LMP dating; EFW at the 49th
%tile
Recommendations

Follow-up as clinically indicated

## 2018-09-06 IMAGING — US US MFM OB FOLLOW-UP
1 series · 13 of 28 positions shown · non-contrast
Comparison: none

[Series 1: us mfm ob follow-up · 13 of 40 slices shown]
[im 2/40]
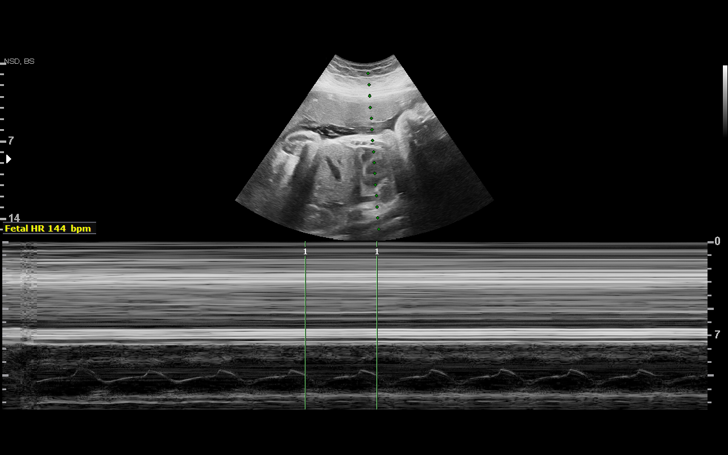
[im 5/40]
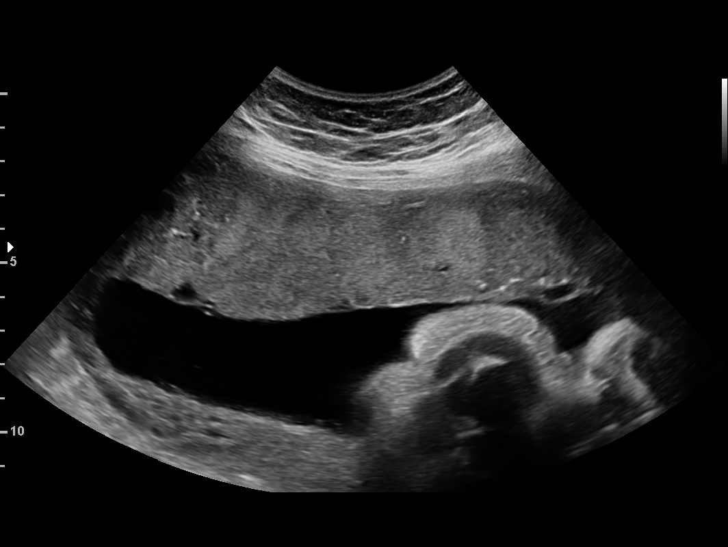
[im 8/40]
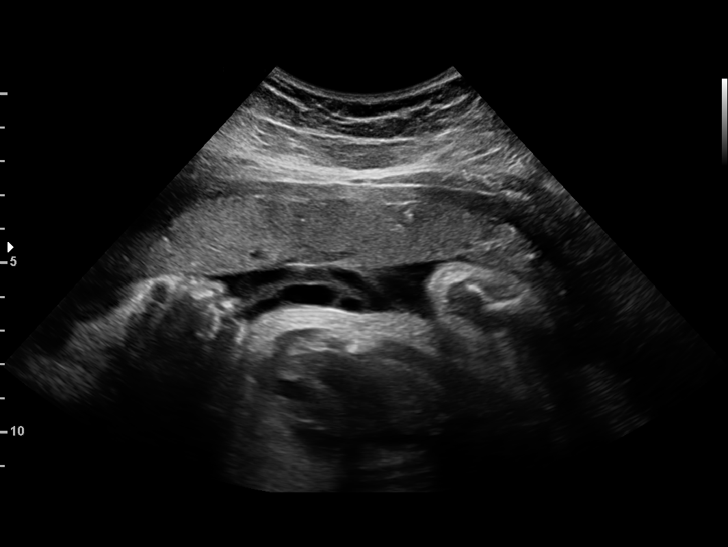
[im 11/40]
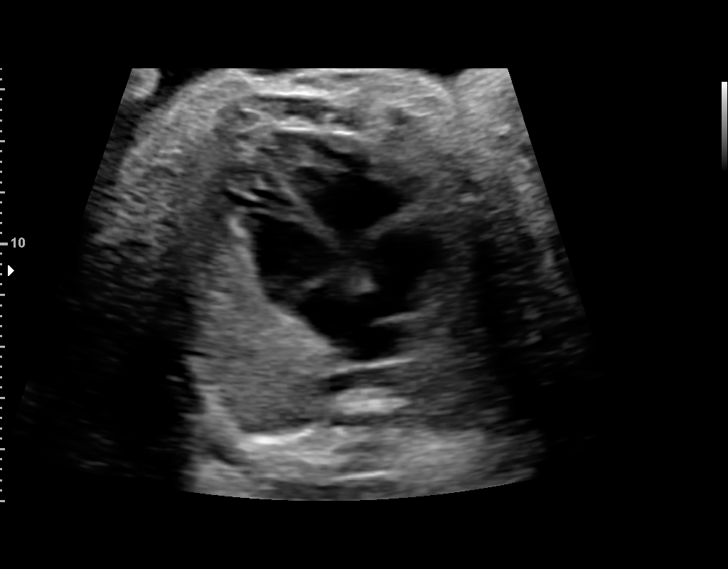
[im 14/40]
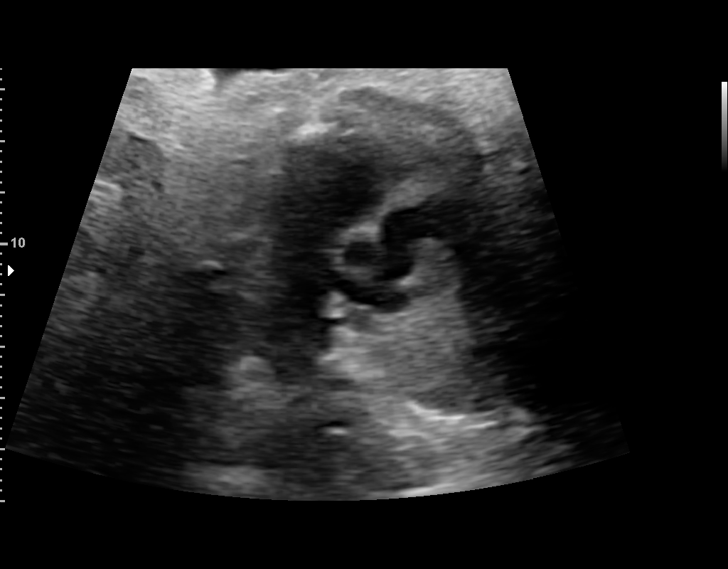
[im 16/40]
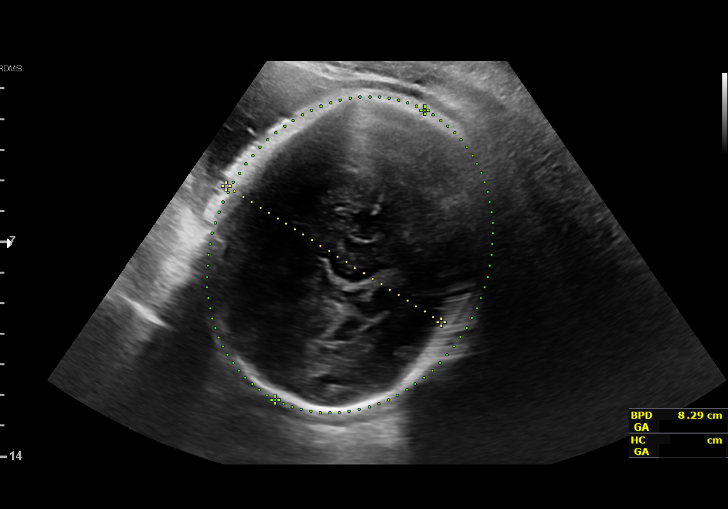
[im 21/40]
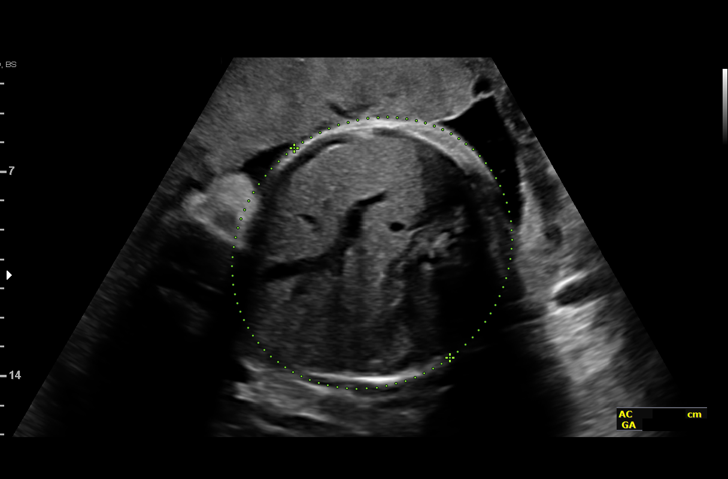
[im 24/40]
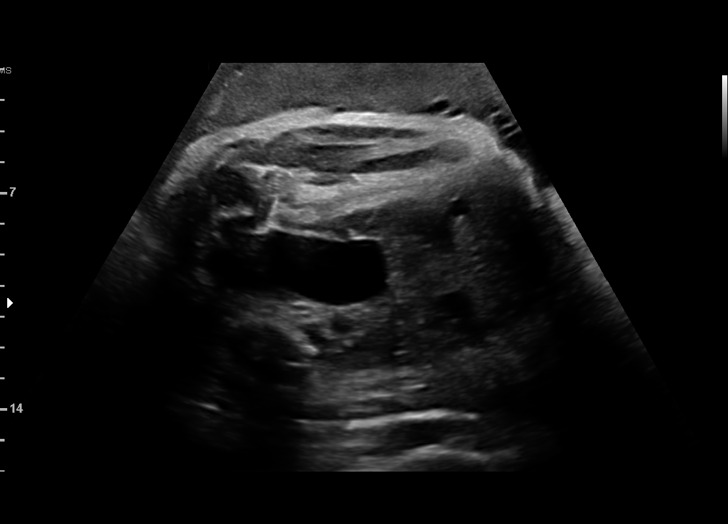
[im 27/40]
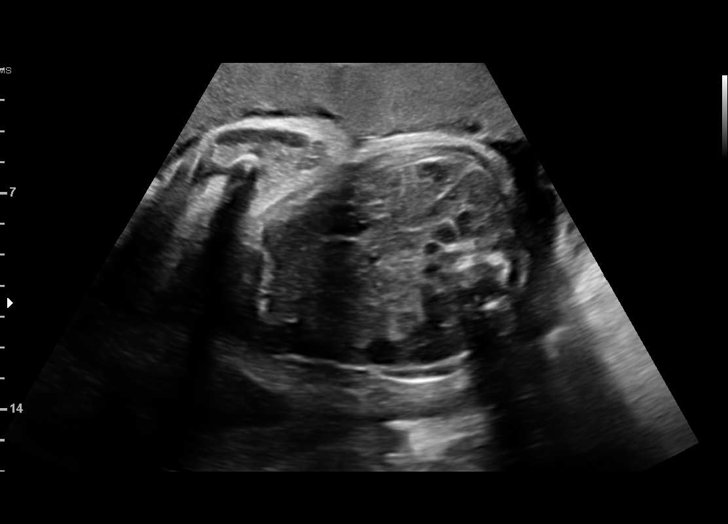
[im 29/40]
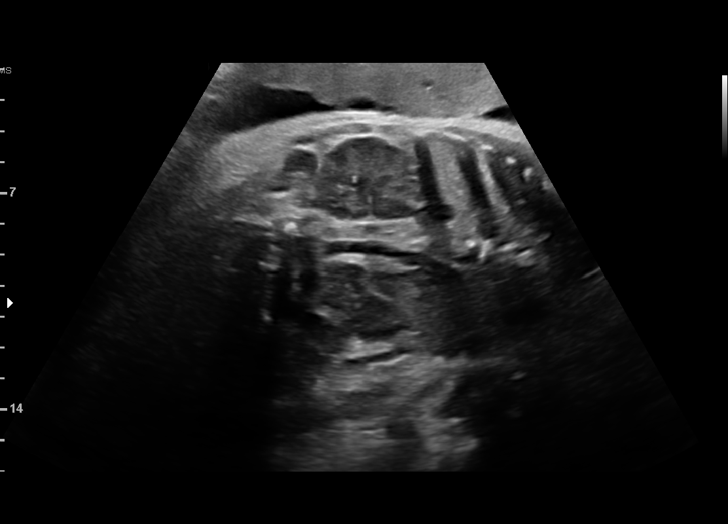
[im 32/40]
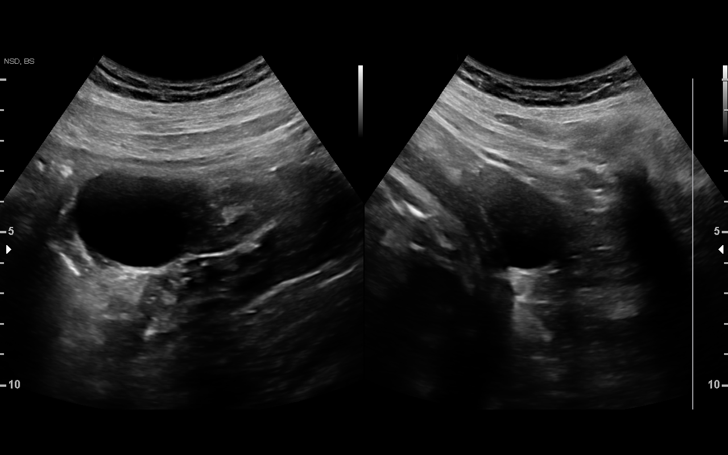
[im 35/40]
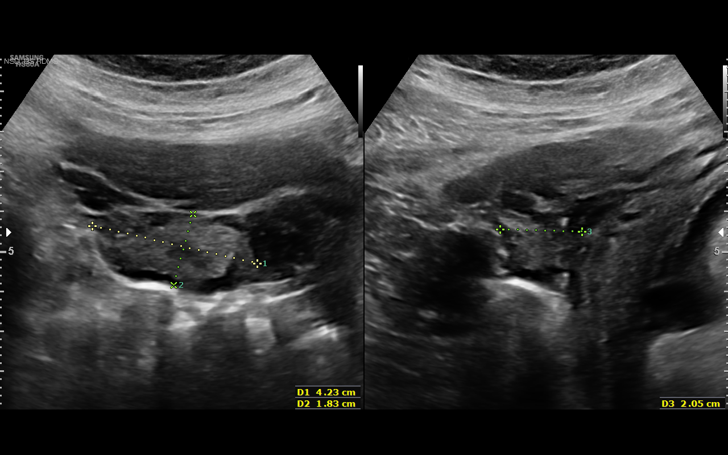
[im 38/40]
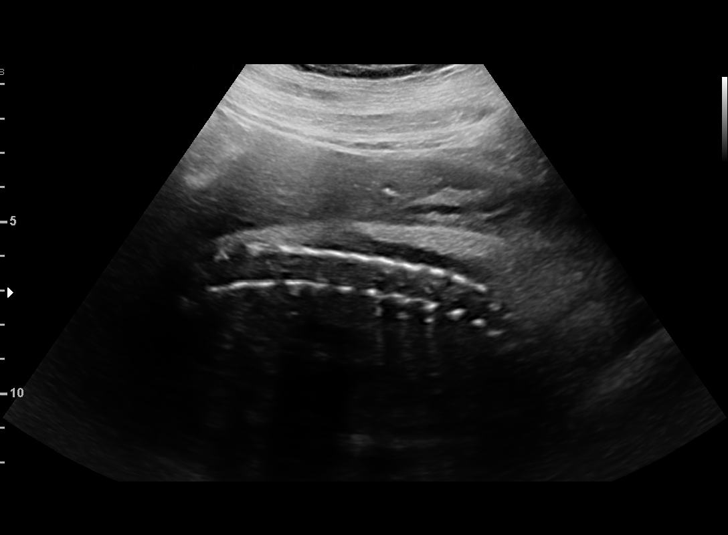

[13 of 28 positions shown; findings below may reference images not displayed]

OB/Gyn Clinic
[REDACTED]

Indications

34 weeks gestation of pregnancy
Antenatal follow-up for nonvisualized fetal
anatomy
Traumatic injury during pregnancy; fall 2
days ago
OB History

Blood Type:            Height:  5'8"   Weight (lb):  183      BMI:
Gravidity:    1         Term:   0        Prem:   0        SAB:   0
TOP:          0       Ectopic:  0        Living: 0
Fetal Evaluation

Num Of Fetuses:     1
Fetal Heart         144
Rate(bpm):
Cardiac Activity:   Observed
Presentation:       Cephalic
Placenta:           Anterior, above cervical os
P. Cord Insertion:  Previously Visualized
Amniotic Fluid
AFI FV:      Subjectively within normal limits

AFI Sum(cm)     %Tile       Largest Pocket(cm)
18.03           66

RUQ(cm)       RLQ(cm)       LUQ(cm)        LLQ(cm)
5.9
Biometry

BPD:      84.7  mm     G. Age:  34w 1d         44  %    CI:        75.89   %   70 - 86
FL/HC:      20.5   %   19.4 -
HC:      308.2  mm     G. Age:  34w 3d         19  %    HC/AC:      1.05       0.96 -
AC:      294.6  mm     G. Age:  33w 3d         30  %    FL/BPD:     74.7   %   71 - 87
FL:       63.3  mm     G. Age:  32w 5d          9  %    FL/AC:      21.5   %   20 - 24
HUM:      55.9  mm     G. Age:  32w 4d         23  %

Est. FW:    9525  gm    4 lb 13 oz      42  %
Gestational Age

LMP:           34w 2d       Date:   10/31/15                 EDD:   08/06/16
U/S Today:     33w 5d                                        EDD:   08/10/16
Best:          34w 2d    Det. By:   LMP  (10/31/15)          EDD:   08/06/16
Anatomy

Cranium:               Appears normal         Aortic Arch:            Previously seen
Cavum:                 Previously seen        Ductal Arch:            Previously seen
Ventricles:            Not well visualized    Diaphragm:              Appears normal
Choroid Plexus:        Previously seen        Stomach:                Appears normal, left
sided
Cerebellum:            Previously seen        Abdomen:                Appears normal
Posterior Fossa:       Previously seen        Abdominal Wall:         Previously seen
Nuchal Fold:           Not applicable (>20    Cord Vessels:           Previously seen
wks GA)
Face:                  Orbits and profile     Kidneys:                Appear normal
previously seen
Lips:                  Previously seen        Bladder:                Appears normal
Thoracic:              Appears normal         Spine:                  Limited views
appear normal
Heart:                 Appears normal         Upper Extremities:      Previously seen
(4CH, axis, and
situs)
RVOT:                  Appears normal         Lower Extremities:      Previously seen
LVOT:                  Appears normal

Other:  Female gender previously seen. Heels and Nasal bone previously
visualized. Technically difficult due to fetal position.
Cervix Uterus Adnexa

Cervix
Not visualized (advanced GA >72wks)

Left Ovary
Simple cyst measuring.
Right Ovary
Within normal limits.

Adnexa:       No abnormality visualized.
Impression

SIUP at 34+2 weeks
Normal interval anatomy; anatomic survey complete
Normal amniotic fluid volume
Appropriate interval growth with EFW at the 42nd %tile
Anterior placenta; no previa; no subchorionic fluid
collections/hemorrhage identified
Recommendations

Follow-up as clinically indicated

## 2019-07-17 ENCOUNTER — Encounter (HOSPITAL_COMMUNITY): Payer: Self-pay | Admitting: Emergency Medicine

## 2019-07-17 ENCOUNTER — Emergency Department (HOSPITAL_COMMUNITY): Payer: Medicaid Other

## 2019-07-17 ENCOUNTER — Other Ambulatory Visit: Payer: Self-pay

## 2019-07-17 ENCOUNTER — Emergency Department (HOSPITAL_COMMUNITY)
Admission: EM | Admit: 2019-07-17 | Discharge: 2019-07-17 | Disposition: A | Discharge status: Home / Self Care | Payer: Medicaid Other | Attending: Emergency Medicine | Admitting: Emergency Medicine

## 2019-07-17 DIAGNOSIS — Z79899 Other long term (current) drug therapy: Secondary | ICD-10-CM | POA: Insufficient documentation

## 2019-07-17 DIAGNOSIS — J45909 Unspecified asthma, uncomplicated: Secondary | ICD-10-CM | POA: Insufficient documentation

## 2019-07-17 DIAGNOSIS — M25562 Pain in left knee: Secondary | ICD-10-CM

## 2019-07-17 DIAGNOSIS — I10 Essential (primary) hypertension: Secondary | ICD-10-CM | POA: Insufficient documentation

## 2019-07-17 HISTORY — DX: Unspecified dislocation of unspecified knee, initial encounter: S83.106A

## 2019-07-17 MED ORDER — HYDROCODONE-ACETAMINOPHEN 5-325 MG PO TABS
1.0000 | ORAL_TABLET | Freq: Once | ORAL | Status: AC
  Administered 2019-07-17: 23:00:00 1 via ORAL
  Filled 2019-07-17: qty 1

## 2019-07-17 NOTE — Progress Notes (Signed)
Orthopedic Tech Progress Note Patient Details:  Holly Greene 1995/07/18 [id number]  Ortho Devices Type of Ortho Device: Crutches, Knee Immobilizer Ortho Device/Splint Location: lle Ortho Device/Splint Interventions: Ordered, Application, Adjustment   Post Interventions Patient Tolerated: Well Instructions Provided: Care of device, Adjustment of device   Trinna Post 07/17/2019, 11:22 PM

## 2019-07-17 NOTE — ED Notes (Signed)
ED Provider at bedside. 

## 2019-07-17 NOTE — ED Provider Notes (Signed)
[facility] EMERGENCY DEPARTMENT Provider Note   CSN: [id number] Arrival date & time: 07/17/19  2137     History Chief Complaint  Patient presents with  . Knee Pain    Holly Greene is a 24 y.o. female.  HPI   24 year old female with a pmh of asthma, BP d/o, HTN, previous knee injury presenting to the emergency department complaining of L patellar pain. The patient was at a friend's house when she twisted her step and felt a pop in her L knee. 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 The patient was not able to tolerate the pain or bear weight prompting her to call EMS. She states she is unable to completely straighten her led. No falls or trauma. No previous surgeries to the extremity. No other joint pain.   Past Medical History:  Diagnosis Date  . Asthma    as a child  . Bipolar 1 disorder (HCC)   . Defiant behavior   . Eczema   . Hypertension   . Knee dislocation     Patient Active Problem List   Diagnosis Date Noted  . Antepartum chorioamnionitis, third trimester, fetus 1 06/29/2017  . Polyhydramnios affecting pregnancy in third trimester 06/29/2017  . Status post primary low transverse cesarean section 06/29/2017  . Preeclampsia 06/28/2017  . Pyelonephritis 05/02/2017  . Trichimoniasis 05/02/2017  . AKI (acute kidney injury) (HCC) 05/02/2017  . Back pain affecting pregnancy in third trimester 04/26/2017  . Bipolar disorder (HCC) 03/06/2017  . Asthma 03/06/2017  . UTI in pregnancy 03/06/2017  . History of gestational hypertension 03/06/2017  . Encounter for supervision of normal pregnancy, unspecified, unspecified trimester 02/20/2017    Past Surgical History:  Procedure Laterality Date  . CESAREAN SECTION N/A 06/29/2017   Procedure: CESAREAN SECTION;  Surgeon: Catalina Antigua, MD;  Location: [facility];  Service: Obstetrics;  Laterality: N/A;  . TONSILLECTOMY       OB  History    Gravida  2   Para  2   Term  2   Preterm      AB      Living  2     SAB      TAB      Ectopic      Multiple  0   Live Births  2           Family History  Problem Relation Age of Onset  . Hypertension Mother     Social History   Tobacco Use  . Smoking status: Never Smoker  . Smokeless tobacco: Never Used  Substance Use Topics  . Alcohol use: No  . Drug use: No    Comment: smoked a week ago    Home Medications Prior to Admission medications   Medication Sig Start Date End Date Taking? Authorizing Provider  amLODipine (NORVASC) 10 MG tablet Take 1 tablet (10 mg total) by mouth daily. Patient not taking: Reported on 07/22/2017 07/02/17   Adam Phenix, MD  ibuprofen (ADVIL,MOTRIN) 600 MG tablet Take 1 tablet (600 mg total) by mouth every 6 (six) hours. 07/02/17   Adam Phenix, MD  oxyCODONE (OXY IR/ROXICODONE) 5 MG immediate release tablet Take 1-2 tablets (5-10 mg total) by mouth every 4 (four) hours as needed (pain scale 4-7). 07/02/17   Adam Phenix, MD  Prenat-FeAsp-Meth-FA-DHA w/o A (PRENATE PIXIE) 10-0.6-0.4-200 MG CAPS Take 1 tablet by mouth daily. 06/26/16   Denney,  Rachelle A, CNM    Allergies    Patient has no known allergies.  Review of Systems   Review of Systems  Constitutional: Negative for activity change, appetite change, chills, diaphoresis, fatigue and fever.  HENT: Negative for congestion and rhinorrhea.   Respiratory: Negative for cough, shortness of breath and wheezing.   Cardiovascular: Negative for chest pain.  Gastrointestinal: Negative for abdominal distention, abdominal pain, diarrhea, nausea and vomiting.  Genitourinary: Negative for decreased urine volume, difficulty urinating, dysuria, frequency and urgency.  Musculoskeletal: Positive for arthralgias, gait problem and joint swelling. Negative for back pain and myalgias.  Skin: Negative for color change and wound.  Neurological: Negative for dizziness, syncope,  weakness, light-headedness and headaches.  All other systems reviewed and are negative.   Physical Exam Updated Vital Signs BP (!) 141/92 (BP Location: Right Arm)   Pulse 91   Temp 98.7 F (37.1 C) (Oral)   Resp 16   Ht 5\' 8"  (1.727 m)   Wt 79.4 kg   LMP 07/17/2019   SpO2 100%   BMI 26.61 kg/m   Physical Exam Vitals and nursing note reviewed.  Constitutional:      General: She is not in acute distress.    Appearance: Normal appearance. She is normal weight. She is not ill-appearing.  HENT:     Head: Normocephalic.     Right Ear: External ear normal.     Left Ear: External ear normal.     Nose: Nose normal.     Mouth/Throat:     Mouth: Mucous membranes are moist.     Pharynx: Oropharynx is clear.  Eyes:     Extraocular Movements: Extraocular movements intact.  Cardiovascular:     Rate and Rhythm: Normal rate and regular rhythm.     Pulses: Normal pulses.     Heart sounds: Normal heart sounds.  Pulmonary:     Effort: Pulmonary effort is normal. No respiratory distress.     Breath sounds: Normal breath sounds. No wheezing or rhonchi.  Abdominal:     General: Bowel sounds are normal.     Palpations: Abdomen is soft.     Tenderness: There is no abdominal tenderness. There is no guarding.  Musculoskeletal:     Cervical back: Normal range of motion.     Right knee: Normal.     Left knee: Swelling and bony tenderness present. No deformity, effusion or crepitus. Decreased range of motion. Tenderness present. No medial joint line, lateral joint line, MCL, LCL, ACL, PCL or patellar tendon tenderness. Normal alignment and normal patellar mobility. Normal pulse.     Right lower leg: Normal. No edema.     Left lower leg: Normal. No edema.  Skin:    General: Skin is warm and dry.     Capillary Refill: Capillary refill takes less than 2 seconds.  Neurological:     General: No focal deficit present.     Mental Status: She is alert and oriented to person, place, and time. Mental  status is at baseline.  Psychiatric:        Mood and Affect: Mood normal.     ED Results / Procedures / Treatments   Labs (all labs ordered are listed, but only abnormal results are displayed) Labs Reviewed - No data to display  EKG None  Radiology DG Knee Complete 4 Views Left  Result Date: 07/17/2019 CLINICAL DATA:  Knee pain, no known injury EXAM: LEFT KNEE - COMPLETE 4+ VIEW COMPARISON:  None. FINDINGS: No evidence  of fracture, dislocation, or joint effusion. No evidence of arthropathy or other focal bone abnormality. Soft tissues are unremarkable. IMPRESSION: Negative. Electronically Signed   By: Rolm Baptise M.D.   On: 07/17/2019 22:31    Procedures Procedures (including critical care time)  Medications Ordered in ED Medications  HYDROcodone-acetaminophen (NORCO/VICODIN) 5-325 MG per tablet 1 tablet (has no administration in time range)    ED Course  I have reviewed the triage vital signs and the nursing notes.  Pertinent labs & imaging results that were available during my care of the patient were reviewed by me and considered in my medical decision making (see chart for details).    MDM Rules/Calculators/A&P                      24 year old female with a pmh of asthma, BP d/o, HTN, previous knee injury presenting to the emergency department complaining of L patellar pain. The patient was at a friend's house when she twisted her step and felt a pop in her L knee  Radiograph of the L knee demonstrated no acute fractures or malalignments.  Suspect patient probably has either a patellar dislocation that self reduced versus a knee sprain.  Low suspicion for an acute knee dislocation without any history of severe trauma.  Patient has good pulses distally low suspicion for need for angiography or evaluation of the vasculature of the posterior knee.  Treat the patient acutely with Norco.  We will place the patient in a knee immobilizer and provide her with crutches while she  acutely has pain over the next several days.  Encourage the patient to treat over-the-counter anti-inflammatory medications as needed.  Encourage patient to use RICE therapy.  Otherwise, I feel the patient is safe and stable for discharge at this time with return precautions and plan for follow-up care in place as needed.  The plan for this patient was discussed with Dr. Regenia Skeeter, who voiced agreement and who oversaw evaluation and treatment of this patient.   Final Clinical Impression(s) / ED Diagnoses Final diagnoses:  Acute pain of left knee    Rx / DC Orders ED Discharge Orders    None       Filbert Berthold, MD 07/17/19 2256    Sherwood Gambler, MD 07/20/19 1705

## 2019-07-17 NOTE — ED Notes (Signed)
Pt verbalizes understanding of d/c instructions. Pt taken to lobby via wheelchair at d/c with all belongings.  

## 2019-07-17 NOTE — ED Notes (Signed)
X-ray at bedside

## 2019-07-17 NOTE — ED Triage Notes (Signed)
Per ems, pt was at a friend's house when she twisted her step and dislocated her left knee. Pt has chronic knee dislocations and is usually able to pop it back in place but pt reporting unable to fix it this time. Pt reports pain upon palpation and bearing weight. Unable to straighten leg. EMS VSS.

## 2019-07-18 ENCOUNTER — Telehealth: Payer: Self-pay | Admitting: *Deleted

## 2019-07-18 NOTE — Telephone Encounter (Signed)
Pt called regarding location of Rx sent from ED.  RNCM reviewed chart to find that EDP advised over-the-counter Rx and did not prescribe additional medications.

## 2019-08-13 IMAGING — US US MFM FETAL BPP W/O NON-STRESS
1 series · 14 of 14 positions shown · non-contrast
Comparison: none

[Series 1: us mfm fetal bpp w/o non-stress · 14 acquisitions, 14 frames shown]
[im 1/14]
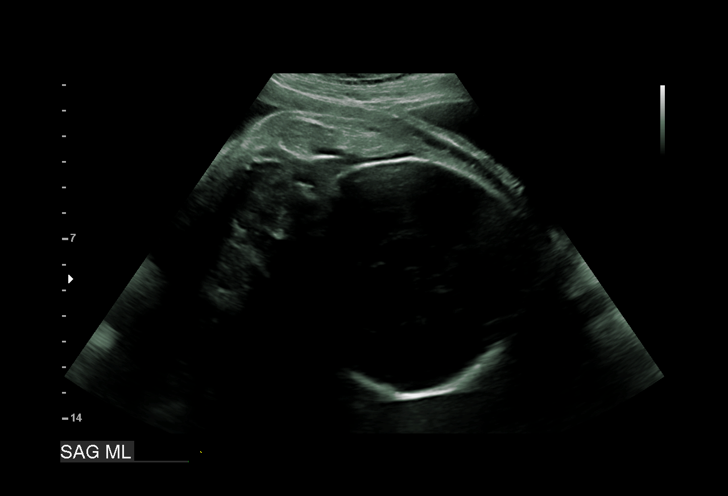
[im 2/14]
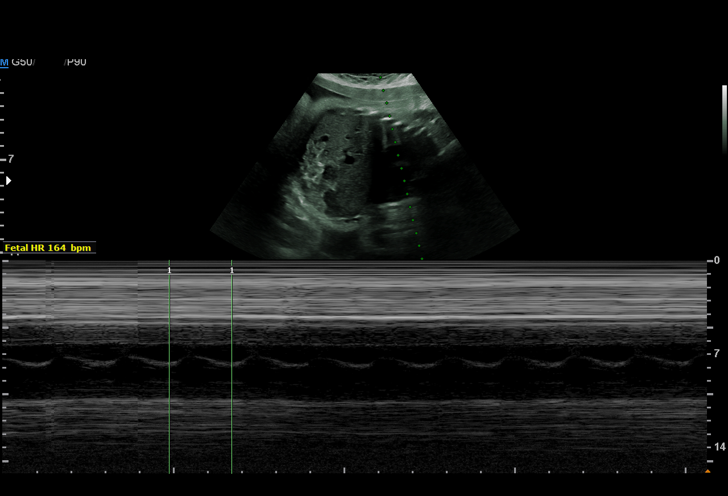
[im 3/14]
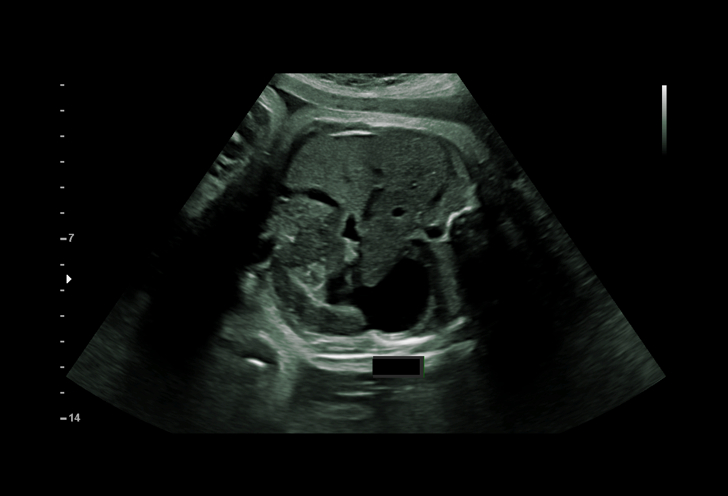
[im 4/14]
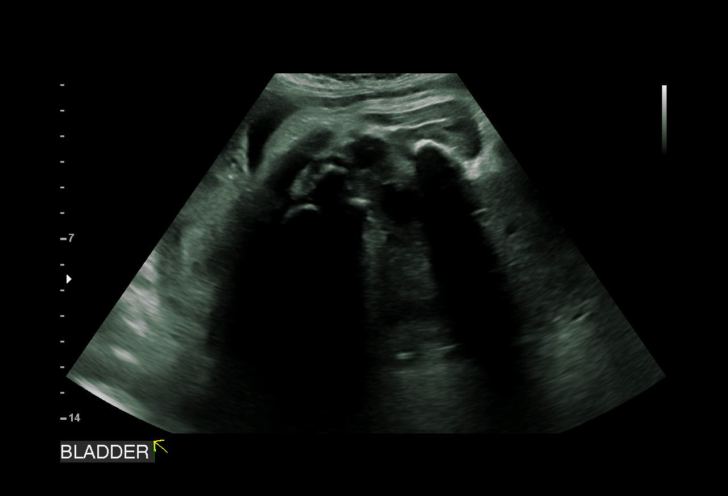
[im 5/14]
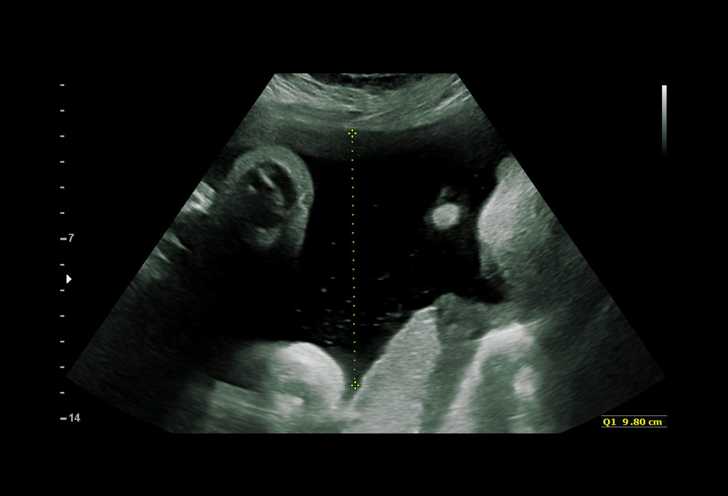
[im 6/14]
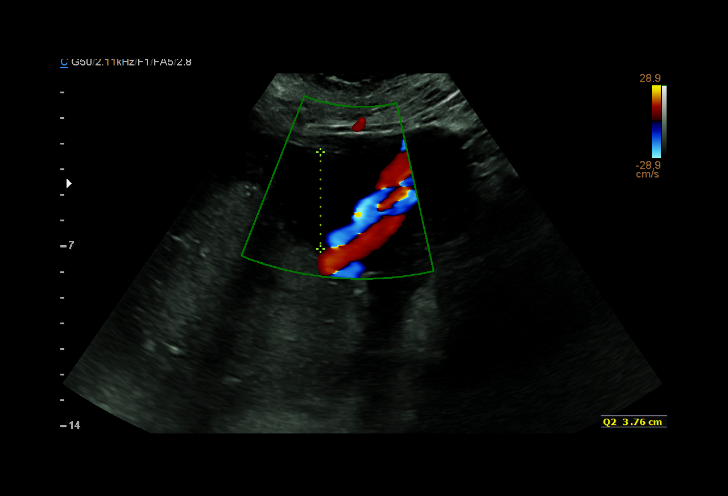
[im 7/14]
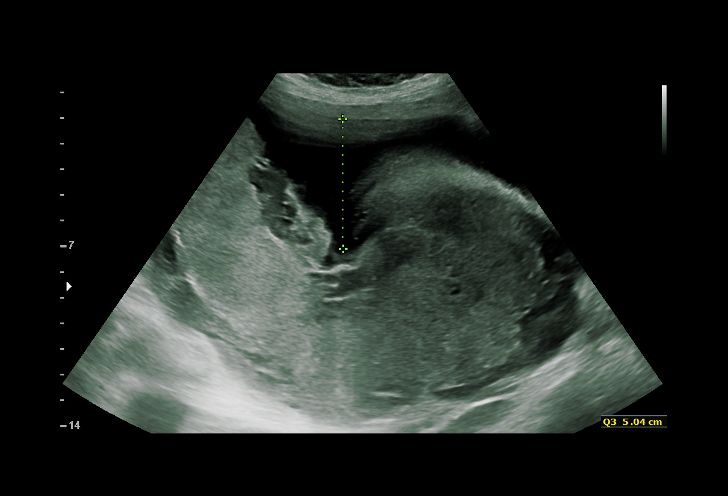
[im 8/14]
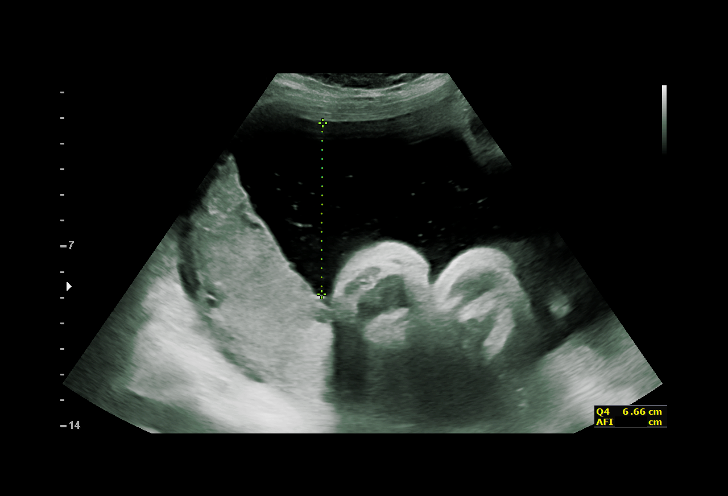
[im 9/14]
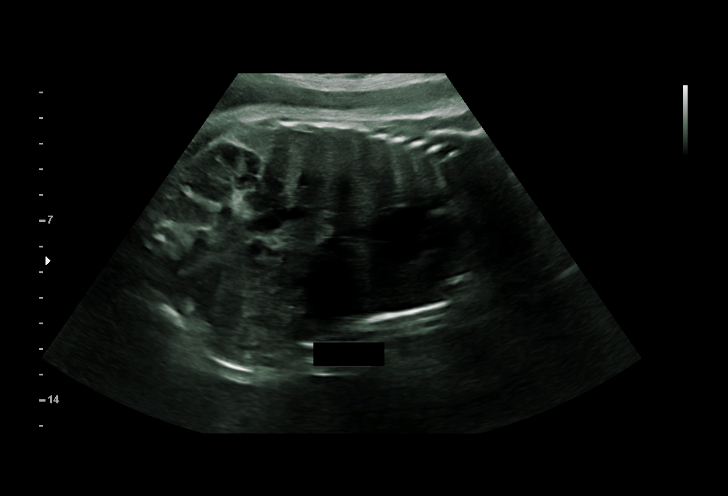
[im 10/14]
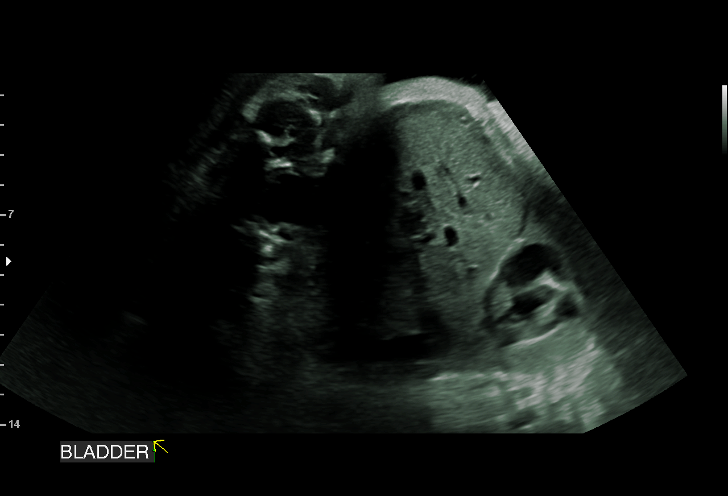
[im 11/14]
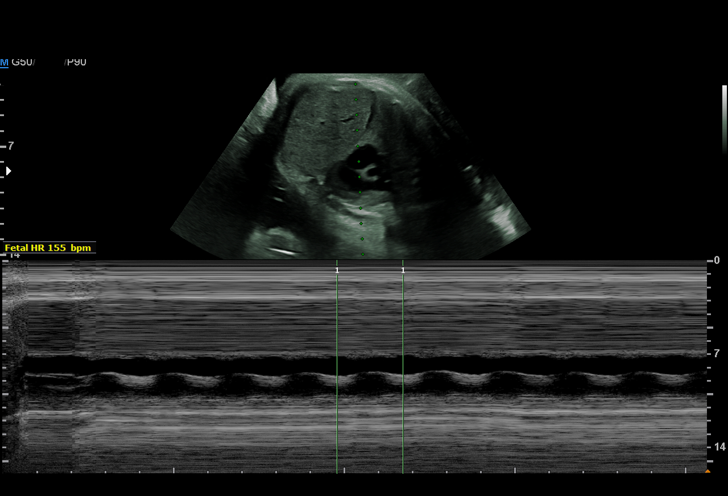
[im 12/14]
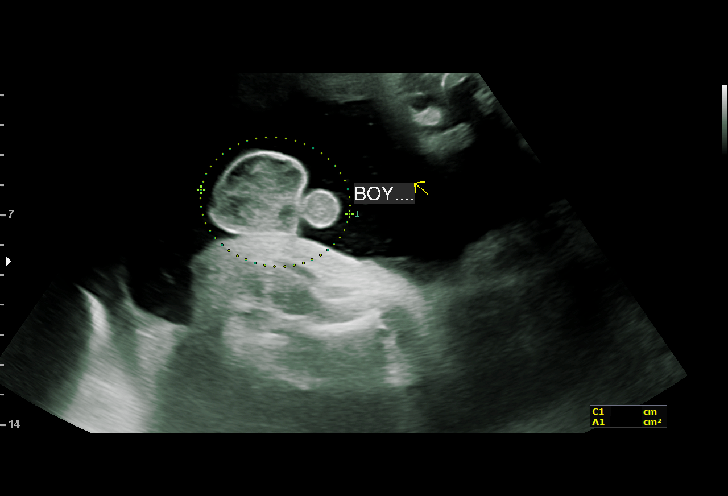
[im 13/14]
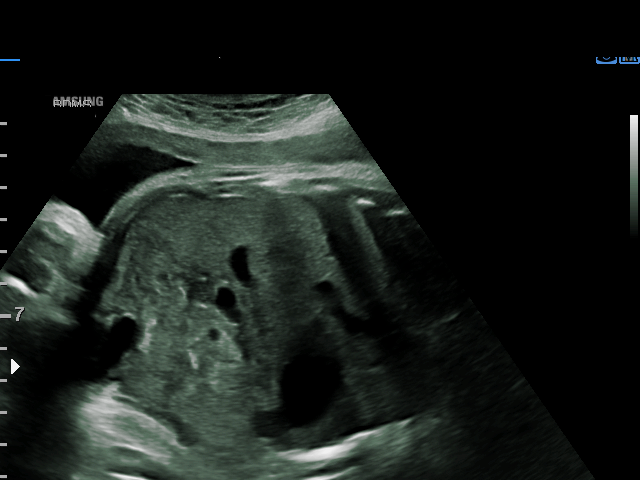
[im 14/14]
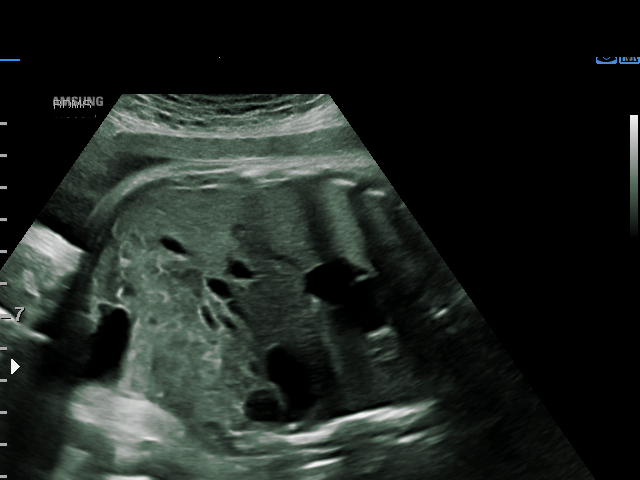

[14 of 14 positions shown; findings below may reference images not displayed]

Road [HOSPITAL]
MAU/Triage

1  NIK HEO          [id number]      [id number]     [id number]
Indications

35 weeks gestation of pregnancy
Maternal care for fetal tachycardia during
pregnancy
Short interval between pregancies, 2nd
trimester (delivery [DATE])
OB History

Blood Type:            Height:  5'7"   Weight (lb):  194       BMI:
Gravidity:    2         Term:   1        Prem:   0        SAB:   0
TOP:          0       Ectopic:  0        Living: 1
Fetal Evaluation

Num Of Fetuses:     1
Fetal Heart         164
Rate(bpm):
Cardiac Activity:   Observed
Presentation:       Cephalic

Amniotic Fluid
AFI FV:      Subjectively upper-normal

AFI Sum(cm)     %Tile       Largest Pocket(cm)
25.26           95

RUQ(cm)       RLQ(cm)       LUQ(cm)        LLQ(cm)
9.8
Biophysical Evaluation
Amniotic F.V:   Within normal limits       F. Tone:        Observed
F. Movement:    Observed                   Score:          [DATE]
F. Breathing:   Observed
Gestational Age

LMP:           31w 3d        Date:  10/26/16                 EDD:   08/02/17
Best:          35w 0d     Det. By:  U/S  (02/26/17)          EDD:   07/08/17
Anatomy

Stomach:               Appears normal, left   Bladder:                Appears normal
sided
Impression

SIUP at 35+0 weeks with cardiac activity
Cephalic presentation
High normal amniotic fluid volume
BPP [DATE]
Recommendations

Follow-up as clinically indicated

## 2020-12-12 ENCOUNTER — Encounter (HOSPITAL_COMMUNITY): Payer: Self-pay | Admitting: Obstetrics and Gynecology

## 2020-12-12 ENCOUNTER — Other Ambulatory Visit: Payer: Self-pay

## 2020-12-12 ENCOUNTER — Inpatient Hospital Stay (HOSPITAL_COMMUNITY)
Admission: AD | Admit: 2020-12-12 | Discharge: 2020-12-12 | Disposition: A | Discharge status: Home / Self Care | Payer: Medicaid Other | Attending: Obstetrics and Gynecology | Admitting: Obstetrics and Gynecology

## 2020-12-12 DIAGNOSIS — Z79899 Other long term (current) drug therapy: Secondary | ICD-10-CM | POA: Insufficient documentation

## 2020-12-12 DIAGNOSIS — Z3A01 Less than 8 weeks gestation of pregnancy: Secondary | ICD-10-CM | POA: Diagnosis not present

## 2020-12-12 DIAGNOSIS — O210 Mild hyperemesis gravidarum: Secondary | ICD-10-CM | POA: Diagnosis not present

## 2020-12-12 LAB — URINALYSIS, ROUTINE W REFLEX MICROSCOPIC
Glucose, UA: NEGATIVE mg/dL
Hgb urine dipstick: NEGATIVE
Ketones, ur: 80 mg/dL — AB
Nitrite: POSITIVE — AB
Protein, ur: 30 mg/dL — AB
Specific Gravity, Urine: 1.028 (ref 1.005–1.030)
WBC, UA: >50 WBC/hpf — ABNORMAL HIGH (ref 0–5)
pH: 5 (ref 5.0–8.0)

## 2020-12-12 LAB — CBC
HCT: 39 % (ref 36.0–46.0)
Hemoglobin: 13.1 g/dL (ref 12.0–15.0)
MCH: 31.6 pg (ref 26.0–34.0)
MCHC: 33.6 g/dL (ref 30.0–36.0)
MCV: 94 fL (ref 80.0–100.0)
Platelets: 254 10*3/uL (ref 150–400)
RBC: 4.15 MIL/uL (ref 3.87–5.11)
RDW: 12 % (ref 11.5–15.5)
WBC: 10.9 10*3/uL — ABNORMAL HIGH (ref 4.0–10.5)
nRBC: 0 % (ref 0.0–0.2)

## 2020-12-12 LAB — COMPREHENSIVE METABOLIC PANEL
ALT: 12 U/L (ref 0–44)
AST: 40 U/L (ref 15–41)
Albumin: 3.8 g/dL (ref 3.5–5.0)
Alkaline Phosphatase: 48 U/L (ref 38–126)
Anion gap: 7 (ref 5–15)
BUN: 6 mg/dL (ref 6–20)
CO2: 23 mmol/L (ref 22–32)
Calcium: 9.3 mg/dL (ref 8.9–10.3)
Chloride: 104 mmol/L (ref 98–111)
Creatinine, Ser: 0.71 mg/dL (ref 0.44–1.00)
GFR, Estimated: >60 mL/min (ref >60)
Glucose, Bld: 85 mg/dL (ref 70–99)
Potassium: 3.6 mmol/L (ref 3.5–5.1)
Sodium: 134 mmol/L — ABNORMAL LOW (ref 135–145)
Total Bilirubin: 0.8 mg/dL (ref 0.3–1.2)
Total Protein: 6.9 g/dL (ref 6.5–8.1)

## 2020-12-12 LAB — POCT PREGNANCY, URINE: Preg Test, Ur: POSITIVE — AB

## 2020-12-12 MED ORDER — ONDANSETRON 8 MG PO TBDP: 8.0000 mg | ORAL_TABLET | Freq: Three times a day (TID) | ORAL | 0 refills | Status: DC | PRN

## 2020-12-12 MED ORDER — ONDANSETRON 4 MG PO TBDP
8.0000 mg | ORAL_TABLET | Freq: Once | ORAL | Status: AC
  Administered 2020-12-12: 8 mg via ORAL
  Filled 2020-12-12: qty 2

## 2020-12-12 MED ORDER — LACTATED RINGERS IV BOLUS
1000.0000 mL | Freq: Once | INTRAVENOUS | Status: AC
  Administered 2020-12-12: 1000 mL via INTRAVENOUS

## 2020-12-12 NOTE — MAU Note (Signed)
Holly Greene is a 25 y.o. at [redacted]w[redacted]d here in MAU reporting: emesis ongoing since last week, states she is not able to keep anything down. States vomiting is nonstop.  LMP: 10/31/2020  Onset of complaint: ongoing  Pain score: 0/10  Vitals:   12/12/20 1610  BP: 119/74  Pulse: 86  Resp: 16  Temp: 98.8 F (37.1 C)  SpO2: 97%    Lab orders placed from triage: ua, upt

## 2020-12-12 NOTE — MAU Provider Note (Signed)
Patient Holly Greene is a 25 y.o.  [id number]  At [redacted]w[redacted]d here with "ongoing" nausea and vomiting that started on Tuesday (6 days ago). She denies abdominal pain, vaginal bleeding, pain with urination, fever, SOB, chest pain. She endorses some acid reflux but no chest pain.   She has a history of one NSVD in 2018 and a c/section in 2018 for "failure to progress".    History     CSN: [id number]  Arrival date and time: 12/12/20 1548   None     Chief Complaint  Patient presents with   Nausea   Emesis   Emesis  This is a new problem. The current episode started in the past 7 days. The problem occurs 5 to 10 times per day. The problem has been waxing and waning. The emesis has an appearance of bile and stomach contents. There has been no fever. Pertinent negatives include no abdominal pain, chest pain, chills, coughing, diarrhea or fever.   OB History     Gravida  3   Para  2   Term  2   Preterm      AB      Living  2      SAB      IAB      Ectopic      Multiple  0   Live Births  2           Past Medical History:  Diagnosis Date   Asthma    as a child   Bipolar 1 disorder (HCC)    Defiant behavior    Eczema    Hypertension    Knee dislocation     Past Surgical History:  Procedure Laterality Date   CESAREAN SECTION N/A 06/29/2017   Procedure: CESAREAN SECTION;  Surgeon: Catalina Antigua, MD;  Location: [facility];  Service: Obstetrics;  Laterality: N/A;   TONSILLECTOMY      Family History  Problem Relation Age of Onset   Hypertension Mother     Social History   Tobacco Use   Smoking status: Never   Smokeless tobacco: Never  Vaping Use   Vaping Use: Never used  Substance Use Topics   Alcohol use: No   Drug use: Not Currently    Types: Marijuana    Comment: Stopped once found out was pregnant    Allergies: No Known Allergies  Medications Prior to Admission  Medication Sig Dispense Refill Last Dose   amLODipine (NORVASC) 10 MG  tablet Take 1 tablet (10 mg total) by mouth daily. (Patient not taking: No sig reported) 20 tablet 1    ibuprofen (ADVIL,MOTRIN) 600 MG tablet Take 1 tablet (600 mg total) by mouth every 6 (six) hours. 30 tablet 0    oxyCODONE (OXY IR/ROXICODONE) 5 MG immediate release tablet Take 1-2 tablets (5-10 mg total) by mouth every 4 (four) hours as needed (pain scale 4-7). 30 tablet 0    Prenat-FeAsp-Meth-FA-DHA w/o A (PRENATE PIXIE) 10-0.6-0.4-200 MG CAPS Take 1 tablet by mouth daily. 30 capsule 12     Review of Systems  Constitutional:  Negative for chills and fever.  Respiratory:  Negative for cough.   Cardiovascular:  Negative for chest pain.  Gastrointestinal:  Positive for vomiting. Negative for abdominal pain and diarrhea.  Physical Exam   Blood pressure 127/68, pulse 70, temperature 99 F (37.2 C), temperature source Oral, resp. rate 16, height 5\' 7"  (1.702 m), weight 89.6 kg, last menstrual period 10/31/2020, SpO2 99 %.  Physical Exam  Constitutional:      Appearance: Normal appearance.  Pulmonary:     Effort: Pulmonary effort is normal.  Abdominal:     General: Abdomen is flat.     Palpations: Abdomen is soft. There is no mass.     Tenderness: There is no abdominal tenderness.  Skin:    General: Skin is warm and dry.     Capillary Refill: Capillary refill takes less than 2 seconds.  Neurological:     General: No focal deficit present.     Mental Status: She is alert.  Psychiatric:        Mood and Affect: Mood normal.    MAU Course  Procedures  MDM -CMP stable -UA shows ketones; patient had 1.5 L of LR --had Zofran, will try PO challenge> patient tolerated PO well Assessment and Plan   1. Morning sickness   -patient stable for discharge  -patient given pregnancy confirmation letter -patient given RX for Zofran -patient given Goodrx coupon for zofran to use until she gets Medicaid   [employer] Nimah Uphoff 12/12/2020, 6:02 PM

## 2021-01-26 ENCOUNTER — Encounter: Payer: Self-pay | Admitting: Obstetrics

## 2021-02-22 ENCOUNTER — Other Ambulatory Visit: Payer: Self-pay

## 2021-02-22 DIAGNOSIS — Z3480 Encounter for supervision of other normal pregnancy, unspecified trimester: Secondary | ICD-10-CM | POA: Insufficient documentation

## 2021-02-22 MED ORDER — GOJJI WEIGHT SCALE MISC: 1.0000 | 0 refills | Status: DC

## 2021-02-22 MED ORDER — BLOOD PRESSURE KIT DEVI: 1.0000 | 0 refills | Status: DC

## 2021-02-23 ENCOUNTER — Encounter: Payer: Self-pay | Admitting: Obstetrics and Gynecology

## 2021-02-23 DIAGNOSIS — Z3480 Encounter for supervision of other normal pregnancy, unspecified trimester: Secondary | ICD-10-CM

## 2021-03-20 ENCOUNTER — Encounter: Payer: Self-pay | Admitting: Obstetrics and Gynecology

## 2021-03-20 ENCOUNTER — Other Ambulatory Visit (HOSPITAL_COMMUNITY)
Admission: RE | Admit: 2021-03-20 | Discharge: 2021-03-20 | Disposition: A | Discharge status: Home / Self Care | Payer: Medicaid Other | Source: Ambulatory Visit | Attending: Obstetrics | Admitting: Obstetrics

## 2021-03-20 ENCOUNTER — Other Ambulatory Visit: Payer: Self-pay

## 2021-03-20 ENCOUNTER — Ambulatory Visit (INDEPENDENT_AMBULATORY_CARE_PROVIDER_SITE_OTHER): Payer: Medicaid Other | Admitting: Obstetrics and Gynecology

## 2021-03-20 VITALS — BP 132/77 | HR 116 | Wt 208.0 lb

## 2021-03-20 DIAGNOSIS — Z3480 Encounter for supervision of other normal pregnancy, unspecified trimester: Secondary | ICD-10-CM | POA: Diagnosis not present

## 2021-03-20 DIAGNOSIS — Z98891 History of uterine scar from previous surgery: Secondary | ICD-10-CM

## 2021-03-20 DIAGNOSIS — O99210 Obesity complicating pregnancy, unspecified trimester: Secondary | ICD-10-CM | POA: Diagnosis not present

## 2021-03-20 DIAGNOSIS — R8271 Bacteriuria: Secondary | ICD-10-CM

## 2021-03-20 DIAGNOSIS — Z3482 Encounter for supervision of other normal pregnancy, second trimester: Secondary | ICD-10-CM | POA: Diagnosis not present

## 2021-03-20 DIAGNOSIS — Z3A20 20 weeks gestation of pregnancy: Secondary | ICD-10-CM

## 2021-03-20 MED ORDER — ASPIRIN EC 81 MG PO TBEC: 81.0000 mg | DELAYED_RELEASE_TABLET | Freq: Every day | ORAL | 2 refills | Status: DC

## 2021-03-20 NOTE — Progress Notes (Signed)
  Subjective:    Holly Greene is a [id number] [redacted]w[redacted]d being seen today for her first obstetrical visit.  Her obstetrical history is significant for obesity, pre-eclampsia, and previous cesarean section due to failure to progress . Patient does intend to breast feed. Pregnancy history fully reviewed.  Patient reports no complaints.  Vitals:   03/20/21 1511  BP: 132/77  Pulse: (!) 116  Weight: 208 lb (94.3 kg)    HISTORY: OB History  Gravida Para Term Preterm AB Living  3 2 2     2   SAB IAB Ectopic Multiple Live Births        0 2    # Outcome Date GA Lbr Len/2nd Weight Sex Delivery Anes PTL Lv  3 Current           2 Term 06/29/17 [redacted]w[redacted]d  8 lb 6.2 oz (3.805 kg) M CS-LTranv EPI  LIV  1 Term 07/27/16 [redacted]w[redacted]d 08:36 / 01:13 7 lb 9.4 oz (3.442 kg) F Vag-Spont EPI  LIV   Past Medical History:  Diagnosis Date  . Asthma    as a child  . Bipolar 1 disorder (HCC)   . Defiant behavior   . Eczema   . Hypertension   . Knee dislocation    Past Surgical History:  Procedure Laterality Date  . CESAREAN SECTION N/A 06/29/2017   Procedure: CESAREAN SECTION;  Surgeon: 07/01/2017, MD;  Location: [facility];  Service: Obstetrics;  Laterality: N/A;  . TONSILLECTOMY     Family History  Problem Relation Age of Onset  . Hypertension Mother      Exam    Uterus:  Fundal Height: 20 cm  Pelvic Exam:    Perineum: No Hemorrhoids   Vulva: normal   Vagina:  normal mucosa, normal discharge   pH:    Cervix: multiparous appearance and cervix is closed and long   Adnexa: not evaluated   Bony Pelvis: gynecoid  System: Breast:  normal appearance, no masses or tenderness   Skin: normal coloration and turgor, no rashes    Neurologic: oriented, no focal deficits   Extremities: normal strength, tone, and muscle mass   HEENT extra ocular movement intact   Mouth/Teeth mucous membranes moist, pharynx normal without lesions and dental hygiene good   Neck supple and no masses   Cardiovascular:  regular rate and rhythm   Respiratory:  appears well, vitals normal, no respiratory distress, acyanotic, normal RR, chest clear, no wheezing, crepitations, rhonchi, normal symmetric air entry   Abdomen: soft, non-tender; bowel sounds normal; no masses,  no organomegaly   Urinary:       Assessment:    Pregnancy: Catalina Antigua Patient Active Problem List   Diagnosis Date Noted  . Maternal obesity affecting pregnancy, antepartum 03/20/2021  . Supervision of other normal pregnancy, antepartum 02/22/2021  . History of cesarean section 06/29/2017  . Bipolar disorder (HCC) 03/06/2017  . Asthma 03/06/2017  . Hx of preeclampsia, prior pregnancy, currently pregnant 03/06/2017        Plan:     Initial labs drawn. Prenatal vitamins. Problem list reviewed and updated. Genetic Screening discussed : panorama ordered.  Ultrasound discussed; fetal survey: ordered. Baseline labs, A1C ordered Rx ASA provided  Follow up in 4 weeks. 50% of 30 min visit spent on counseling and coordination of care.     Maleka Contino 03/20/2021

## 2021-03-20 NOTE — Progress Notes (Signed)
New OB @ 20 wks OB panel, OB urine, GC/CC, Pap today Genetic screening offered and accepted Depression and anxiety screen neg Hx pre eclampsia, C/S for FTP, maternal obesity  Several cancelled or no show appts: denies transportation or childcare needs.

## 2021-03-20 NOTE — Addendum Note (Signed)
Addended by: Harrel Lemon on: 03/20/2021 03:57 PM   Modules accepted: Orders

## 2021-03-20 NOTE — Addendum Note (Signed)
Addended by: Catalina Antigua on: 03/20/2021 04:12 PM   Modules accepted: Orders

## 2021-03-20 NOTE — Addendum Note (Signed)
Addended by: Harrel Lemon on: 03/20/2021 05:07 PM   Modules accepted: Orders

## 2021-03-20 NOTE — Patient Instructions (Signed)
Second Trimester of Pregnancy The second trimester of pregnancy is from week 13 through week 27. This is months 4 through 6 of pregnancy. The second trimester is often a time when you feel your best. Your body has adjusted to being pregnant, and you begin to feel better physically. During the second trimester: Morning sickness has lessened or stopped completely. You may have more energy. You may have an increase in appetite. The second trimester is also a time when the unborn baby (fetus) is growing rapidly. At the end of the sixth month, the fetus may be up to 12 inches long and weigh about 1 pounds. You will likely begin to feel the baby move (quickening) between 16 and 20 weeks of pregnancy. Body changes during your second trimester Your body continues to go through many changes during your second trimester. The changes vary and generally return to normal after the baby is born. Physical changes Your weight will continue to increase. You will notice your lower abdomen bulging out. You may begin to get stretch marks on your hips, abdomen, and breasts. Your breasts will continue to grow and to become tender. Dark spots or blotches (chloasma or mask of pregnancy) may develop on your face. A dark line from your belly button to the pubic area (linea nigra) may appear. You may have changes in your hair. These can include thickening of your hair, rapid growth, and changes in texture. Some people also have hair loss during or after pregnancy, or hair that feels dry or thin. Health changes You may develop headaches. You may have heartburn. You may develop constipation. You may develop hemorrhoids or swollen, bulging veins (varicose veins). Your gums may bleed and may be sensitive to brushing and flossing. You may urinate more often because the fetus is pressing on your bladder. You may have back pain. This is caused by: Weight gain. Pregnancy hormones that are relaxing the joints in your  pelvis. A shift in weight and the muscles that support your balance. Follow these instructions at home: Medicines Follow your health care provider's instructions regarding medicine use. Specific medicines may be either safe or unsafe to take during pregnancy. Do not take any medicines unless approved by your health care provider. Take a prenatal vitamin that contains at least 600 micrograms (mcg) of folic acid. Eating and drinking Eat a healthy diet that includes fresh fruits and vegetables, whole grains, good sources of protein such as meat, eggs, or tofu, and low-fat dairy products. Avoid raw meat and unpasteurized juice, milk, and cheese. These carry germs that can harm you and your baby. You may need to take these actions to prevent or treat constipation: Drink enough fluid to keep your urine pale yellow. Eat foods that are high in fiber, such as beans, whole grains, and fresh fruits and vegetables. Limit foods that are high in fat and processed sugars, such as fried or sweet foods. Activity Exercise only as directed by your health care provider. Most people can continue their usual exercise routine during pregnancy. Try to exercise for 30 minutes at least 5 days a week. Stop exercising if you develop contractions in your uterus. Stop exercising if you develop pain or cramping in the lower abdomen or lower back. Avoid exercising if it is very hot or humid or if you are at a high altitude. Avoid heavy lifting. If you choose to, you may have sex unless your health care provider tells you not to. Relieving pain and discomfort Wear a supportive bra   to prevent discomfort from breast tenderness. Take warm sitz baths to soothe any pain or discomfort caused by hemorrhoids. Use hemorrhoid cream if your health care provider approves. Rest with your legs raised (elevated) if you have leg cramps or low back pain. If you develop varicose veins: Wear support hose as told by your health care  provider. Elevate your feet for 15 minutes, 3-4 times a day. Limit salt in your diet. Safety Wear your seat belt at all times when driving or riding in a car. Talk with your health care provider if someone is verbally or physically abusive to you. Lifestyle Do not use hot tubs, steam rooms, or saunas. Do not douche. Do not use tampons or scented sanitary pads. Avoid cat litter boxes and soil used by cats. These carry germs that can cause birth defects in the baby and possibly loss of the fetus by miscarriage or stillbirth. Do not use herbal remedies, alcohol, illegal drugs, or medicines that are not approved by your health care provider. Chemicals in these products can harm your baby. Do not use any products that contain nicotine or tobacco, such as cigarettes, e-cigarettes, and chewing tobacco. If you need help quitting, ask your health care provider. General instructions During a routine prenatal visit, your health care provider will do a physical exam and other tests. He or she will also discuss your overall health. Keep all follow-up visits. This is important. Ask your health care provider for a referral to a local prenatal education class. Ask for help if you have counseling or nutritional needs during pregnancy. Your health care provider can offer advice or refer you to specialists for help with various needs. Where to find more information American Pregnancy Association: americanpregnancy.org American College of Obstetricians and Gynecologists: acog.org/en/Womens%20Health/Pregnancy Office on Women's Health: womenshealth.gov/pregnancy Contact a health care provider if you have: A headache that does not go away when you take medicine. Vision changes or you see spots in front of your eyes. Mild pelvic cramps, pelvic pressure, or nagging pain in the abdominal area. Persistent nausea, vomiting, or diarrhea. A bad-smelling vaginal discharge or foul-smelling urine. Pain when you  urinate. Sudden or extreme swelling of your face, hands, ankles, feet, or legs. A fever. Get help right away if you: Have fluid leaking from your vagina. Have spotting or bleeding from your vagina. Have severe abdominal cramping or pain. Have difficulty breathing. Have chest pain. Have fainting spells. Have not felt your baby move for the time period told by your health care provider. Have new or increased pain, swelling, or redness in an arm or leg. Summary The second trimester of pregnancy is from week 13 through week 27 (months 4 through 6). Do not use herbal remedies, alcohol, illegal drugs, or medicines that are not approved by your health care provider. Chemicals in these products can harm your baby. Exercise only as directed by your health care provider. Most people can continue their usual exercise routine during pregnancy. Keep all follow-up visits. This is important. This information is not intended to replace advice given to you by your health care provider. Make sure you discuss any questions you have with your health care provider. Document Revised: 09/02/2019 Document Reviewed: 07/09/2019 Elsevier Patient Education  2022 [employer]  Contraception Choices Contraception, also called birth control, refers to methods or devices that prevent pregnancy. Hormonal methods Contraceptive implant A contraceptive implant is a thin, plastic tube that contains a hormone that prevents pregnancy. It is different from an intrauterine device (IUD). It   is inserted into the upper part of the arm by a health care provider. Implants can be effective for up to 3 years. Progestin-only injections Progestin-only injections are injections of progestin, a synthetic form of the hormone progesterone. They are given every 3 months by a health care provider. Birth control pills Birth control pills are pills that contain hormones that prevent pregnancy. They must be taken once a day, preferably at the  same time each day. A prescription is needed to use this method of contraception. Birth control patch The birth control patch contains hormones that prevent pregnancy. It is placed on the skin and must be changed once a week for three weeks and removed on the fourth week. A prescription is needed to use this method of contraception. Vaginal ring A vaginal ring contains hormones that prevent pregnancy. It is placed in the vagina for three weeks and removed on the fourth week. After that, the process is repeated with a new ring. A prescription is needed to use this method of contraception. Emergency contraceptive Emergency contraceptives prevent pregnancy after unprotected sex. They come in pill form and can be taken up to 5 days after sex. They work best the sooner they are taken after having sex. Most emergency contraceptives are available without a prescription. This method should not be used as your only form of birth control. Barrier methods Female condom A female condom is a thin sheath that is worn over the penis during sex. Condoms keep sperm from going inside a woman's body. They can be used with a sperm-killing substance (spermicide) to increase their effectiveness. They should be thrown away after one use. Female condom A female condom is a soft, loose-fitting sheath that is put into the vagina before sex. The condom keeps sperm from going inside a woman's body. They should be thrown away after one use. Diaphragm A diaphragm is a soft, dome-shaped barrier. It is inserted into the vagina before sex, along with a spermicide. The diaphragm blocks sperm from entering the uterus, and the spermicide kills sperm. A diaphragm should be left in the vagina for 6-8 hours after sex and removed within 24 hours. A diaphragm is prescribed and fitted by a health care provider. A diaphragm should be replaced every 1-2 years, after giving birth, after gaining more than 15 lb (6.8 kg), and after pelvic  surgery. Cervical cap A cervical cap is a round, soft latex or plastic cup that fits over the cervix. It is inserted into the vagina before sex, along with spermicide. It blocks sperm from entering the uterus. The cap should be left in place for 6-8 hours after sex and removed within 48 hours. A cervical cap must be prescribed and fitted by a health care provider. It should be replaced every 2 years. Sponge A sponge is a soft, circular piece of polyurethane foam with spermicide in it. The sponge helps block sperm from entering the uterus, and the spermicide kills sperm. To use it, you make it wet and then insert it into the vagina. It should be inserted before sex, left in for at least 6 hours after sex, and removed and thrown away within 30 hours. Spermicides Spermicides are chemicals that kill or block sperm from entering the cervix and uterus. They can come as a cream, jelly, suppository, foam, or tablet. A spermicide should be inserted into the vagina with an applicator at least 10-15 minutes before sex to allow time for it to work. The process must be repeated every time   you have sex. Spermicides do not require a prescription. Intrauterine contraception Intrauterine device (IUD) An IUD is a T-shaped device that is put in a woman's uterus. There are two types: Hormone IUD.This type contains progestin, a synthetic form of the hormone progesterone. This type can stay in place for 3-5 years. Copper IUD.This type is wrapped in copper wire. It can stay in place for 10 years. Permanent methods of contraception Female tubal ligation In this method, a woman's fallopian tubes are sealed, tied, or blocked during surgery to prevent eggs from traveling to the uterus. Hysteroscopic sterilization In this method, a small, flexible insert is placed into each fallopian tube. The inserts cause scar tissue to form in the fallopian tubes and block them, so sperm cannot reach an egg. The procedure takes about 3  months to be effective. Another form of birth control must be used during those 3 months. Female sterilization This is a procedure to tie off the tubes that carry sperm (vasectomy). After the procedure, the man can still ejaculate fluid (semen). Another form of birth control must be used for 3 months after the procedure. Natural planning methods Natural family planning In this method, a couple does not have sex on days when the woman could become pregnant. Calendar method In this method, the woman keeps track of the length of each menstrual cycle, identifies the days when pregnancy can happen, and does not have sex on those days. Ovulation method In this method, a couple avoids sex during ovulation. Symptothermal method This method involves not having sex during ovulation. The woman typically checks for ovulation by watching changes in her temperature and in the consistency of cervical mucus. Post-ovulation method In this method, a couple waits to have sex until after ovulation. Where to find more information Centers for Disease Control and Prevention: www.cdc.gov Summary Contraception, also called birth control, refers to methods or devices that prevent pregnancy. Hormonal methods of contraception include implants, injections, pills, patches, vaginal rings, and emergency contraceptives. Barrier methods of contraception can include female condoms, female condoms, diaphragms, cervical caps, sponges, and spermicides. There are two types of IUDs (intrauterine devices). An IUD can be put in a woman's uterus to prevent pregnancy for 3-5 years. Permanent sterilization can be done through a procedure for males and females. Natural family planning methods involve nothaving sex on days when the woman could become pregnant. This information is not intended to replace advice given to you by your health care provider. Make sure you discuss any questions you have with your health care provider. Document  Revised: 08/31/2019 Document Reviewed: 08/31/2019 Elsevier Patient Education  2022 [employer] Inc.  Vaginal Birth After Cesarean Delivery Vaginal birth after cesarean delivery (VBAC) means giving birth vaginally after previously delivering a baby through a cesarean section, or C-section. VBAC may be a safe option for you, depending on your health and other factors. Discuss VBAC with your health care provider early in your pregnancy, so you can understand the risks, benefits, and options. Having these discussions early will give you time to make your birth plan. What increases the chances for a successful VBAC? These factors increase your chances of a successful VBAC: You have had only one previous C-section. You had a low transverse incision for your C-section. You have had a successful vaginal birth. Your labor starts naturally on or before your due date. You and your baby have had a healthy pregnancy. Your baby is head-down. What happens when I arrive at the birth center or hospital? Once   you are in labor and have been admitted into the hospital or birth center, your health care team may: Review your pregnancy history and go over any concerns you have. Talk with you about your birth plan and discuss pain control options. Check your blood pressure, breathing rate, and heart rate. Check your baby's heartbeat. Monitor your uterus for contractions. Check whether your bag of water (amniotic sac) has broken (ruptured). Insert an IV into one of your veins. You may get fluids and medicine through the IV. Monitoring and exams Your health care team may check your contractions (uterine monitoring) and your baby's heart rate (fetal monitoring). You may need to be monitored for long periods at a time (continuously) with a monitoring device. Your health care provider may also do frequent physical exams. These may include checking how and where your baby is positioned in your uterus and checking your  cervix to determine whether it is opening up (dilating) or thinning out (effacing). What happens during labor and delivery? Normal labor and delivery are divided into the following three stages: Stage 1 This is the longest stage of labor. Throughout this stage, you will feel contractions. Contractions are generally mild, infrequent, and irregular at first. They get stronger, more frequent (about every 2-3 minutes), and more regular as you move through this stage. The first stage ends when your cervix is completely dilated to 4 inches (10 cm) and effaced. Stage 2 This stage starts once your cervix is completely dilated and effaced and lasts until you deliver your baby. In this stage, you will feel the urge to push your baby out of your vagina. You may feel stretching and burning pain, especially when the widest part of your baby's head passes through the vaginal opening (crowning). Once your baby is delivered, the umbilical cord will be clamped and cut. Your baby will be placed on your bare chest and covered with a warm blanket. If you are planning to breastfeed, watch your baby for feeding cues, like rooting or sucking. Stage 3 This stage starts immediately after your baby is born and ends after you deliver the placenta. This stage may take anywhere from 5 to 30 minutes. What can I expect after labor and delivery? After labor is over, you and your baby will be checked to make sure you are both healthy, and you will both be monitored until you are ready to go home. You may be encouraged to stay in the same room with your baby to promote early bonding and successful breastfeeding. Your uterus will be checked and massaged regularly (fundal massage). You may continue to receive fluids and medicines through an IV. You will have some soreness and pain in your abdomen, vagina, and the area of skin between your vaginal opening and your anus (perineum). If an incision was made near your vagina  (episiotomy) or if you had some vaginal tearing during delivery, cold compresses may be used to reduce pain and swelling. Follow these instructions at home: Incision care If you have an episiotomy incision, follow instructions from your health care provider about how to take care of your incision. Check your incision area every day for signs of infection. Check for: Redness, swelling, or pain. More fluid or blood. Warmth. Pus or a bad smell. If directed, put ice on the episiotomy area. To do this: Put ice in a plastic bag. Place a towel between your skin and the bag. Leave the ice on for 20 minutes, 2-3 times a day. Remove the ice   if your skin turns bright red. This is very important. If you cannot feel pain, heat, or cold, you have a greater risk of damage to the area. Activity Return to your normal activities as told by your health care provider. Ask your health care provider what activities are safe for you. Avoid sitting for a long time without moving. Get up to take short walks every 1-2 hours. General instructions Take over-the-counter and prescription medicines only as told by your health care provider. Do not take baths, swim, or use a hot tub until your health care provider approves. Ask if you may take showers. You may only be allowed to take sponge baths. It is normal to have vaginal bleeding after delivery. Wear a sanitary pad for vaginal bleeding and discharge. Keep all follow-up visits. This is important. Where to find more information American Pregnancy Association: americanpregnancy.org American College of Obstetricians and Gynecologists: acog.org Summary Vaginal birth after cesarean delivery (VBAC) means giving birth vaginally after previously delivering a baby through a cesarean section, or C-section. Once you are in labor and have been admitted into the hospital or birth center, your health care team may review your pregnancy history and go over any concerns you  have. Although most women are able to have a successful VBAC, sometimes it is necessary to have another C-section. Keep all follow-up visits. This is important. This information is not intended to replace advice given to you by your health care provider. Make sure you discuss any questions you have with your health care provider. Document Revised: 03/24/2020 Document Reviewed: 03/24/2020 Elsevier Patient Education  2022 Else[employer] Inc.  

## 2021-03-21 LAB — CBC/D/PLT+RPR+RH+ABO+RUBIGG...
Antibody Screen: NEGATIVE
Basophils Absolute: 0 10*3/uL (ref 0.0–0.2)
Basos: 0 %
EOS (ABSOLUTE): 0.3 10*3/uL (ref 0.0–0.4)
Eos: 3 %
HCV Ab: <0.1 s/co ratio (ref 0.0–0.9)
HIV Screen 4th Generation wRfx: NONREACTIVE
Hematocrit: 32.2 % — ABNORMAL LOW (ref 34.0–46.6)
Hemoglobin: 11 g/dL — ABNORMAL LOW (ref 11.1–15.9)
Hepatitis B Surface Ag: NEGATIVE
Immature Grans (Abs): 0 10*3/uL (ref 0.0–0.1)
Immature Granulocytes: 0 %
Lymphocytes Absolute: 2 10*3/uL (ref 0.7–3.1)
Lymphs: 19 %
MCH: 31.7 pg (ref 26.6–33.0)
MCHC: 34.2 g/dL (ref 31.5–35.7)
MCV: 93 fL (ref 79–97)
Monocytes Absolute: 0.7 10*3/uL (ref 0.1–0.9)
Monocytes: 7 %
Neutrophils Absolute: 7.3 10*3/uL — ABNORMAL HIGH (ref 1.4–7.0)
Neutrophils: 71 %
Platelets: 265 10*3/uL (ref 150–450)
RBC: 3.47 x10E6/uL — ABNORMAL LOW (ref 3.77–5.28)
RDW: 12.7 % (ref 11.7–15.4)
RPR Ser Ql: NONREACTIVE
Rh Factor: POSITIVE
Rubella Antibodies, IGG: 3.03 index (ref >0.99)
WBC: 10.4 10*3/uL (ref 3.4–10.8)

## 2021-03-21 LAB — COMPREHENSIVE METABOLIC PANEL
ALT: 10 IU/L (ref 0–32)
AST: 29 IU/L (ref 0–40)
Albumin/Globulin Ratio: 1.6 (ref 1.2–2.2)
Albumin: 3.7 g/dL — ABNORMAL LOW (ref 3.9–5.0)
Alkaline Phosphatase: 75 IU/L (ref 44–121)
BUN/Creatinine Ratio: 11 (ref 9–23)
BUN: 7 mg/dL (ref 6–20)
Bilirubin Total: <0.2 mg/dL (ref 0.0–1.2)
CO2: 21 mmol/L (ref 20–29)
Calcium: 8.8 mg/dL (ref 8.7–10.2)
Chloride: 106 mmol/L (ref 96–106)
Creatinine, Ser: 0.65 mg/dL (ref 0.57–1.00)
Globulin, Total: 2.3 g/dL (ref 1.5–4.5)
Glucose: 83 mg/dL (ref 70–99)
Potassium: 4.1 mmol/L (ref 3.5–5.2)
Sodium: 139 mmol/L (ref 134–144)
Total Protein: 6 g/dL (ref 6.0–8.5)
eGFR: 125 mL/min/{1.73_m2} (ref >59)

## 2021-03-21 LAB — CERVICOVAGINAL ANCILLARY ONLY
Chlamydia: NEGATIVE
Comment: NEGATIVE
Comment: NORMAL
Neisseria Gonorrhea: NEGATIVE

## 2021-03-21 LAB — PROTEIN / CREATININE RATIO, URINE
Creatinine, Urine: 157.4 mg/dL
Protein, Ur: 24.3 mg/dL
Protein/Creat Ratio: 154 mg/g creat (ref 0–200)

## 2021-03-21 LAB — HEMOGLOBIN A1C
Est. average glucose Bld gHb Est-mCnc: 103 mg/dL
Hgb A1c MFr Bld: 5.2 % (ref 4.8–5.6)

## 2021-03-21 LAB — HCV INTERPRETATION

## 2021-03-24 LAB — CYTOLOGY - PAP
Comment: NEGATIVE
Diagnosis: NEGATIVE
High risk HPV: NEGATIVE

## 2021-03-24 LAB — CULTURE, OB URINE

## 2021-03-24 LAB — URINE CULTURE, OB REFLEX

## 2021-03-27 DIAGNOSIS — R8271 Bacteriuria: Secondary | ICD-10-CM | POA: Insufficient documentation

## 2021-03-27 MED ORDER — CEPHALEXIN 500 MG PO CAPS: 500.0000 mg | ORAL_CAPSULE | Freq: Four times a day (QID) | ORAL | 2 refills | Status: DC

## 2021-03-27 NOTE — Addendum Note (Signed)
Addended by: Catalina Antigua on: 03/27/2021 08:19 AM   Modules accepted: Orders

## 2021-03-28 ENCOUNTER — Encounter: Payer: Self-pay | Admitting: Obstetrics and Gynecology

## 2021-04-03 ENCOUNTER — Encounter (HOSPITAL_COMMUNITY): Payer: Self-pay | Admitting: *Deleted

## 2021-04-03 ENCOUNTER — Inpatient Hospital Stay (HOSPITAL_COMMUNITY)
Admission: AD | Admit: 2021-04-03 | Discharge: 2021-04-03 | Disposition: A | Discharge status: Home / Self Care | Payer: Medicaid Other | Attending: Family Medicine | Admitting: Family Medicine

## 2021-04-03 ENCOUNTER — Other Ambulatory Visit: Payer: Self-pay

## 2021-04-03 DIAGNOSIS — E669 Obesity, unspecified: Secondary | ICD-10-CM | POA: Insufficient documentation

## 2021-04-03 DIAGNOSIS — O26892 Other specified pregnancy related conditions, second trimester: Secondary | ICD-10-CM | POA: Diagnosis not present

## 2021-04-03 DIAGNOSIS — O98912 Unspecified maternal infectious and parasitic disease complicating pregnancy, second trimester: Secondary | ICD-10-CM | POA: Diagnosis not present

## 2021-04-03 DIAGNOSIS — R079 Chest pain, unspecified: Secondary | ICD-10-CM | POA: Diagnosis not present

## 2021-04-03 DIAGNOSIS — Z3480 Encounter for supervision of other normal pregnancy, unspecified trimester: Secondary | ICD-10-CM

## 2021-04-03 DIAGNOSIS — O99212 Obesity complicating pregnancy, second trimester: Secondary | ICD-10-CM | POA: Insufficient documentation

## 2021-04-03 DIAGNOSIS — R55 Syncope and collapse: Secondary | ICD-10-CM | POA: Diagnosis not present

## 2021-04-03 DIAGNOSIS — Z3A22 22 weeks gestation of pregnancy: Secondary | ICD-10-CM | POA: Diagnosis not present

## 2021-04-03 DIAGNOSIS — R42 Dizziness and giddiness: Secondary | ICD-10-CM

## 2021-04-03 DIAGNOSIS — O99210 Obesity complicating pregnancy, unspecified trimester: Secondary | ICD-10-CM | POA: Diagnosis not present

## 2021-04-03 DIAGNOSIS — R0902 Hypoxemia: Secondary | ICD-10-CM | POA: Diagnosis not present

## 2021-04-03 DIAGNOSIS — R8271 Bacteriuria: Secondary | ICD-10-CM | POA: Diagnosis not present

## 2021-04-03 DIAGNOSIS — R200 Anesthesia of skin: Secondary | ICD-10-CM | POA: Insufficient documentation

## 2021-04-03 DIAGNOSIS — M542 Cervicalgia: Secondary | ICD-10-CM | POA: Diagnosis not present

## 2021-04-03 LAB — URINALYSIS, ROUTINE W REFLEX MICROSCOPIC
Bilirubin Urine: NEGATIVE
Glucose, UA: NEGATIVE mg/dL
Hgb urine dipstick: NEGATIVE
Ketones, ur: NEGATIVE mg/dL
Nitrite: POSITIVE — AB
Protein, ur: NEGATIVE mg/dL
Specific Gravity, Urine: 1.02 (ref 1.005–1.030)
pH: 7 (ref 5.0–8.0)

## 2021-04-03 LAB — CBC WITH DIFFERENTIAL/PLATELET
Abs Immature Granulocytes: 0.03 10*3/uL (ref 0.00–0.07)
Basophils Absolute: 0 10*3/uL (ref 0.0–0.1)
Basophils Relative: 0 %
Eosinophils Absolute: 0.2 10*3/uL (ref 0.0–0.5)
Eosinophils Relative: 2 %
HCT: 32.9 % — ABNORMAL LOW (ref 36.0–46.0)
Hemoglobin: 11.1 g/dL — ABNORMAL LOW (ref 12.0–15.0)
Immature Granulocytes: 0 %
Lymphocytes Relative: 13 %
Lymphs Abs: 1.1 10*3/uL (ref 0.7–4.0)
MCH: 32.3 pg (ref 26.0–34.0)
MCHC: 33.7 g/dL (ref 30.0–36.0)
MCV: 95.6 fL (ref 80.0–100.0)
Monocytes Absolute: 0.7 10*3/uL (ref 0.1–1.0)
Monocytes Relative: 9 %
Neutro Abs: 6.4 10*3/uL (ref 1.7–7.7)
Neutrophils Relative %: 76 %
Platelets: 248 10*3/uL (ref 150–400)
RBC: 3.44 MIL/uL — ABNORMAL LOW (ref 3.87–5.11)
RDW: 12.6 % (ref 11.5–15.5)
WBC: 8.5 10*3/uL (ref 4.0–10.5)
nRBC: 0 % (ref 0.0–0.2)

## 2021-04-03 LAB — URINALYSIS, MICROSCOPIC (REFLEX)

## 2021-04-03 LAB — BASIC METABOLIC PANEL
Anion gap: 9 (ref 5–15)
BUN: 8 mg/dL (ref 6–20)
CO2: 22 mmol/L (ref 22–32)
Calcium: 8.9 mg/dL (ref 8.9–10.3)
Chloride: 104 mmol/L (ref 98–111)
Creatinine, Ser: 0.67 mg/dL (ref 0.44–1.00)
GFR, Estimated: >60 mL/min (ref >60)
Glucose, Bld: 66 mg/dL — ABNORMAL LOW (ref 70–99)
Potassium: 3.5 mmol/L (ref 3.5–5.1)
Sodium: 135 mmol/L (ref 135–145)

## 2021-04-03 NOTE — Discharge Instructions (Signed)
For near feinting spells: Drink plenty of water Eat frequent snacks Move slowly, especially when standing up. Buy and wear compression stockings, preferably knee high stockings.  I have written a note for you to get a stool at work.  If this continues to happen, please let your doctor know so we can refer you to the [facility].

## 2021-04-03 NOTE — ED Notes (Signed)
Report called to [facility], [facility] and transport called

## 2021-04-03 NOTE — ED Triage Notes (Signed)
Pt arrived by [facility]. Was hot at work and had near syncopal episode. At that time had onset of left side neck pain and left side of body was numb, those symptoms have resolved. Patient is approx 4-5 months pregnant.

## 2021-04-03 NOTE — MAU Provider Note (Signed)
History     CSN: [id number]  Arrival date and time: 04/03/21 1703    Chief Complaint  Patient presents with   Near Syncope   HPI This is a 25 year old G3 P2-0-0-2 at 22 weeks and 0 days.  She presents to the MAU after having a near syncopal episode that started earlier today.  These have been happening on a daily occurrence over the past several days.  These are worse after going from a sitting position to a standing position or other rapid position changes.  They are improved by sitting down and eating something.  She has been trying to have liquid drink and food nearby.  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  Denies headaches, weakness, palpitations, shortness of breath, chest pain.  She does have some left-sided numbness that occurs when she has the episodes.  When the episodes happen, her vision gets blurry and a tunnel vision when she gets really hot.  OB History     Gravida  3   Para  2   Term  2   Preterm      AB      Living  2      SAB      IAB      Ectopic      Multiple  0   Live Births  2           Past Medical History:  Diagnosis Date   Asthma    as a child   Bipolar 1 disorder ([place])    Defiant behavior    Eczema    Hypertension    Knee dislocation     Past Surgical History:  Procedure Laterality Date   CESAREAN SECTION N/A 06/29/2017   Procedure: CESAREAN SECTION;  Surgeon: Mora Bellman, MD;  Location: [place];  Service: Obstetrics;  Laterality: N/A;   TONSILLECTOMY      Family History  Problem Relation Age of Onset   Hypertension Mother     Social History   Tobacco Use   Smoking status: Never   Smokeless tobacco: Never  Vaping Use   Vaping Use: Never used  Substance Use Topics   Alcohol use: No   Drug use: Not Currently    Types: Marijuana    Comment: Stopped once found out was pregnant    Allergies: No Known Allergies  Medications Prior to Admission  Medication  Sig Dispense Refill Last Dose   Prenat-FeAsp-Meth-FA-DHA w/o A (PRENATE PIXIE) 10-0.6-0.4-200 MG CAPS Take 1 tablet by mouth daily. 30 capsule 12 04/03/2021   aspirin EC 81 MG tablet Take 1 tablet (81 mg total) by mouth daily. Take after 12 weeks for prevention of preeclampsia later in pregnancy 300 tablet 2 Unknown   Blood Pressure Monitoring (BLOOD PRESSURE KIT) DEVI 1 kit by Does not apply route once a week. Check Blood Pressure regularly and record readings into the Babyscripts App.  Large Cuff.  DX O90.0 1 each 0 Unknown   cephALEXin (KEFLEX) 500 MG capsule Take 1 capsule (500 mg total) by mouth 4 (four) times daily. 28 capsule 2 Unknown   Misc. Devices (GOJJI WEIGHT SCALE) MISC 1 Device by Does not apply route once a week. CHECK WEIGHT WEEKLY AND RECORD DATA INTO BABY SCRIPTS APP 1 each 0 Unknown   ondansetron (ZOFRAN-ODT) 8 MG disintegrating tablet Take 1 tablet (8 mg total) by mouth every 8 (eight) hours as needed for nausea or vomiting.  60 tablet 0 Unknown    Review of Systems Physical Exam   Blood pressure 127/73, pulse 99, temperature 98.8 F (37.1 C), temperature source Oral, resp. rate 17, weight 95.6 kg, last menstrual period 10/31/2020, SpO2 100 %.  Physical Exam Vitals reviewed.  Constitutional:      Appearance: Normal appearance.  Cardiovascular:     Rate and Rhythm: Normal rate and regular rhythm.  Pulmonary:     Effort: Pulmonary effort is normal.     Breath sounds: Normal breath sounds.  Abdominal:     General: Abdomen is flat.  Skin:    General: Skin is warm and dry.     Capillary Refill: Capillary refill takes less than 2 seconds.  Neurological:     General: No focal deficit present.     Mental Status: She is alert.  Psychiatric:        Mood and Affect: Mood normal.        Behavior: Behavior normal.        Thought Content: Thought content normal.        Judgment: Judgment normal.   Results for orders placed or performed during the hospital encounter of  04/03/21 (from the past 24 hour(s))  CBC with Differential     Status: Abnormal   Collection Time: 04/03/21  5:36 PM  Result Value Ref Range   WBC 8.5 4.0 - 10.5 K/uL   RBC 3.44 (L) 3.87 - 5.11 MIL/uL   Hemoglobin 11.1 (L) 12.0 - 15.0 g/dL   HCT 32.9 (L) 36.0 - 46.0 %   MCV 95.6 80.0 - 100.0 fL   MCH 32.3 26.0 - 34.0 pg   MCHC 33.7 30.0 - 36.0 g/dL   RDW 12.6 11.5 - 15.5 %   Platelets 248 150 - 400 K/uL   nRBC 0.0 0.0 - 0.2 %   Neutrophils Relative % 76 %   Neutro Abs 6.4 1.7 - 7.7 K/uL   Lymphocytes Relative 13 %   Lymphs Abs 1.1 0.7 - 4.0 K/uL   Monocytes Relative 9 %   Monocytes Absolute 0.7 0.1 - 1.0 K/uL   Eosinophils Relative 2 %   Eosinophils Absolute 0.2 0.0 - 0.5 K/uL   Basophils Relative 0 %   Basophils Absolute 0.0 0.0 - 0.1 K/uL   Immature Granulocytes 0 %   Abs Immature Granulocytes 0.03 0.00 - 0.07 K/uL  Basic metabolic panel     Status: Abnormal   Collection Time: 04/03/21  5:36 PM  Result Value Ref Range   Sodium 135 135 - 145 mmol/L   Potassium 3.5 3.5 - 5.1 mmol/L   Chloride 104 98 - 111 mmol/L   CO2 22 22 - 32 mmol/L   Glucose, Bld 66 (L) 70 - 99 mg/dL   BUN 8 6 - 20 mg/dL   Creatinine, Ser 0.67 0.44 - 1.00 mg/dL   Calcium 8.9 8.9 - 10.3 mg/dL   GFR, Estimated >60 >60 mL/min   Anion gap 9 5 - 15  ABO/Rh     Status: None   Collection Time: 04/03/21  5:42 PM  Result Value Ref Range   ABO/RH(D)      O POS Performed at [facility], [address], [place], [place] [address]      MAU Course  Procedures EKG independently reviewed.  Normal sinus rhythm with normal rate.  No acute ST changes.   MDM  Assessment and Plan   1. Maternal obesity affecting pregnancy, antepartum   2. Supervision of  other normal pregnancy, antepartum   3. GBS bacteriuria   4. [redacted] weeks gestation of pregnancy   5. Postural dizziness with presyncope    No arrhythmia detected on EKG.  Electrolytes normal.  Patient mildly anemic, but not to the point of causing the  presyncopal episodes.  Encourage patient to continue with hydration and frequent snacks. Will provide work note for stool accommodation so that she is sitting set of standing for prolonged periods, which will exacerbate the patient's condition.  Also advised the patient to purchase knee-high compression stockings.  If this were to continue despite the interventions, will refer patient to [facility] cardiology for evaluation.  Truett Mainland 04/03/2021, 7:10 PM

## 2021-04-03 NOTE — ED Provider Notes (Signed)
Emergency Medicine Provider Triage Evaluation Note  Holly Greene , a 25 y.o. female  was evaluated in triage.  Pt complains of feeling hot, lightheaded, like she was going to pass out at work. 5-6 months pregnant. G3p2. Hypertension in previous pregnancy but otherwise term births. Some nausea at this time, no vomiting. Some crampy abdominal pain, worsening at this time. Describes left sided numbness extending from neck to left arm. No chest pain. Reports she has not felt baby move since last night.  Review of Systems  Positive: Near syncope, numbness, nausea Negative: Chest pain, shortness of breath  Physical Exam  BP 113/69 (BP Location: Right Arm)    Pulse 94    Temp 98.2 F (36.8 C) (Oral)    Resp (!) 22    LMP 10/31/2020    SpO2 99%  Gen:   Awake, no distress   Resp:  Normal effort  MSK:   Moves extremities without difficulty  Other:  Fundus palpated, no fetal movement detected. Normal heart rate and rhythm, Normal breath sounds. Fetal heart tones 150  Medical Decision Making  Medically screening exam initiated at 5:33 PM.  Appropriate orders placed.  Holly Greene was informed that the remainder of the evaluation will be completed by another provider, this initial triage assessment does not replace that evaluation, and the importance of remaining in the ED until their evaluation is complete.  Called MAU APP Misty Stanley who agrees to transfer   Holly Greene 04/03/21 Nolon Nations, MD 04/03/21 [phone]

## 2021-04-03 NOTE — ED Notes (Signed)
Fetal HR 151

## 2021-04-03 NOTE — MAU Note (Addendum)
Pt reports to MAU with c/o dizziness and one fainting episode that occurred while she was at work around 1530.  Pt states that she just kind of bumped her belly not feel on her belly.  Pt denies VB and LOF.  Pt also reports stiffness in her neck on the left side.  Pt states that she did not fall to the floor.   +FM

## 2021-04-04 ENCOUNTER — Encounter: Payer: Self-pay | Admitting: Obstetrics and Gynecology

## 2021-04-04 LAB — ABO/RH: ABO/RH(D): O POS

## 2021-04-05 ENCOUNTER — Ambulatory Visit: Payer: Medicaid Other

## 2021-04-05 ENCOUNTER — Other Ambulatory Visit: Payer: Self-pay | Admitting: Family Medicine

## 2021-04-05 MED ORDER — CEFADROXIL 500 MG PO CAPS: 500.0000 mg | ORAL_CAPSULE | Freq: Two times a day (BID) | ORAL | 0 refills | Status: DC

## 2021-04-09 NOTE — L&D Delivery Note (Signed)
OB/GYN Faculty Practice Delivery Note ? ?Holly Greene is a 26 y.o. [phone] s/p VBAC at [redacted]w[redacted]d. She was admitted for SOL.  ? ?ROM: 5h 46m with meconium stained fluid ?GBS Status: Positive; received 2 doses of Ampicillin  ? ?Delivery Date/Time: 08/04/21 at 1532 ? ?Delivery: Called to room and patient was complete and pushing. Head delivered LOA. Nuchal cord present and reduced at the perineum. Shoulder and body delivered in usual fashion. Infant with spontaneous cry, placed on mother's abdomen, dried and stimulated. Cord clamped x 2 after 1-minute delay and cut by FOB under direct supervision. Cord blood drawn. Placenta delivered spontaneously with gentle cord traction. Fundus firm with massage and Pitocin. Labia, perineum, vagina, and cervix were inspected, and patient was found to have bilateral labial abrasions that were hemostatic and not repaired.  ? ?Placenta: Intact, 3VC - sent to L&D ?Complications: None  ?Lacerations: Bilateral labial abrasions ?EBL: 250 cc ?Analgesia: Epidural  ? ?Infant: Viable female  APGARs 8 and 8 ? ?Vilma Meckel, MD ?OB/GYN Fellow, Faculty Practice  ? ? ?

## 2021-04-17 ENCOUNTER — Encounter: Payer: Medicaid Other | Admitting: Obstetrics and Gynecology

## 2021-04-27 ENCOUNTER — Ambulatory Visit: Payer: Medicaid Other | Attending: Obstetrics and Gynecology

## 2021-04-27 ENCOUNTER — Other Ambulatory Visit: Payer: Self-pay

## 2021-04-27 ENCOUNTER — Ambulatory Visit: Payer: Medicaid Other | Admitting: *Deleted

## 2021-04-27 VITALS — BP 122/68 | HR 92

## 2021-04-27 DIAGNOSIS — R8271 Bacteriuria: Secondary | ICD-10-CM

## 2021-04-27 DIAGNOSIS — O99210 Obesity complicating pregnancy, unspecified trimester: Secondary | ICD-10-CM | POA: Diagnosis not present

## 2021-04-27 DIAGNOSIS — O34219 Maternal care for unspecified type scar from previous cesarean delivery: Secondary | ICD-10-CM | POA: Diagnosis not present

## 2021-04-27 DIAGNOSIS — O321XX0 Maternal care for breech presentation, not applicable or unspecified: Secondary | ICD-10-CM | POA: Diagnosis not present

## 2021-04-27 DIAGNOSIS — Z3480 Encounter for supervision of other normal pregnancy, unspecified trimester: Secondary | ICD-10-CM

## 2021-04-27 DIAGNOSIS — N83209 Unspecified ovarian cyst, unspecified side: Secondary | ICD-10-CM | POA: Insufficient documentation

## 2021-04-27 DIAGNOSIS — J45909 Unspecified asthma, uncomplicated: Secondary | ICD-10-CM | POA: Diagnosis not present

## 2021-04-27 DIAGNOSIS — O99512 Diseases of the respiratory system complicating pregnancy, second trimester: Secondary | ICD-10-CM | POA: Diagnosis not present

## 2021-04-27 DIAGNOSIS — O3482 Maternal care for other abnormalities of pelvic organs, second trimester: Secondary | ICD-10-CM | POA: Diagnosis not present

## 2021-04-27 DIAGNOSIS — O09292 Supervision of pregnancy with other poor reproductive or obstetric history, second trimester: Secondary | ICD-10-CM | POA: Diagnosis not present

## 2021-04-27 DIAGNOSIS — Z3A25 25 weeks gestation of pregnancy: Secondary | ICD-10-CM | POA: Insufficient documentation

## 2021-04-27 DIAGNOSIS — L309 Dermatitis, unspecified: Secondary | ICD-10-CM | POA: Insufficient documentation

## 2021-04-27 DIAGNOSIS — O99212 Obesity complicating pregnancy, second trimester: Secondary | ICD-10-CM | POA: Diagnosis not present

## 2021-04-28 ENCOUNTER — Other Ambulatory Visit: Payer: Self-pay | Admitting: *Deleted

## 2021-04-28 DIAGNOSIS — O09299 Supervision of pregnancy with other poor reproductive or obstetric history, unspecified trimester: Secondary | ICD-10-CM

## 2021-04-28 DIAGNOSIS — O99519 Diseases of the respiratory system complicating pregnancy, unspecified trimester: Secondary | ICD-10-CM

## 2021-04-28 DIAGNOSIS — O34219 Maternal care for unspecified type scar from previous cesarean delivery: Secondary | ICD-10-CM

## 2021-04-28 DIAGNOSIS — J45909 Unspecified asthma, uncomplicated: Secondary | ICD-10-CM

## 2021-05-04 ENCOUNTER — Encounter: Payer: Medicaid Other | Admitting: Obstetrics & Gynecology

## 2021-05-24 ENCOUNTER — Ambulatory Visit (INDEPENDENT_AMBULATORY_CARE_PROVIDER_SITE_OTHER): Payer: Medicaid Other | Admitting: Obstetrics and Gynecology

## 2021-05-24 ENCOUNTER — Encounter: Payer: Self-pay | Admitting: Obstetrics and Gynecology

## 2021-05-24 ENCOUNTER — Other Ambulatory Visit: Payer: Self-pay

## 2021-05-24 VITALS — BP 109/75 | HR 75 | Wt 208.4 lb

## 2021-05-24 DIAGNOSIS — Z3480 Encounter for supervision of other normal pregnancy, unspecified trimester: Secondary | ICD-10-CM

## 2021-05-24 DIAGNOSIS — Z3A29 29 weeks gestation of pregnancy: Secondary | ICD-10-CM

## 2021-05-24 DIAGNOSIS — Z98891 History of uterine scar from previous surgery: Secondary | ICD-10-CM | POA: Diagnosis not present

## 2021-05-24 DIAGNOSIS — O09299 Supervision of pregnancy with other poor reproductive or obstetric history, unspecified trimester: Secondary | ICD-10-CM

## 2021-05-24 DIAGNOSIS — R8271 Bacteriuria: Secondary | ICD-10-CM | POA: Diagnosis not present

## 2021-05-24 NOTE — Progress Notes (Signed)
Subjective:  Holly Greene is a 26 y.o. G3P2002 at 108w2d being seen today for ongoing prenatal care.  She is currently monitored for the following issues for this high-risk pregnancy and has Bipolar disorder ([place]); Asthma; Hx of preeclampsia, prior pregnancy, currently pregnant; History of cesarean section; Supervision of other normal pregnancy, antepartum; Maternal obesity affecting pregnancy, antepartum; GBS bacteriuria; and Eczema on their problem list.  Patient reports general discomforts of pregnancy.  Contractions: Not present. Vag. Bleeding: None.  Movement: Present. Denies leaking of fluid.   The following portions of the patient's history were reviewed and updated as appropriate: allergies, current medications, past family history, past medical history, past social history, past surgical history and problem list. Problem list updated.  Objective:   Vitals:   05/24/21 1019  BP: 109/75  Pulse: 75  Weight: 208 lb 6.4 oz (94.5 kg)    Fetal Status:     Movement: Present     General:  Alert, oriented and cooperative. Patient is in no acute distress.  Skin: Skin is warm and dry. No rash noted.   Cardiovascular: Normal heart rate noted  Respiratory: Normal respiratory effort, no problems with respiration noted  Abdomen: Soft, gravid, appropriate for gestational age. Pain/Pressure: Absent     Pelvic:  Cervical exam deferred        Extremities: Normal range of motion.  Edema: None  Mental Status: Normal mood and affect. Normal behavior. Normal judgment and thought content.   Urinalysis:      Assessment and Plan:  Pregnancy: G3P2002 at [redacted]w[redacted]d  1. Supervision of other normal pregnancy, antepartum Stable Schedule glucola  2. History of cesarean section Desires VBAC Consented today  3. Hx of preeclampsia, prior pregnancy, currently pregnant Stable   4. GBS bacteriuria Tx while in labor  Preterm labor symptoms and general obstetric precautions including but not limited to  vaginal bleeding, contractions, leaking of fluid and fetal movement were reviewed in detail with the patient. Please refer to After Visit Summary for other counseling recommendations.  Return in about 2 weeks (around 06/07/2021) for OB visit, face to face, any provider.   Chancy Milroy, MD

## 2021-05-24 NOTE — Patient Instructions (Signed)

## 2021-05-24 NOTE — Progress Notes (Signed)
Pt in office for routine prenatal visit. She has no concerns today. PHQ9:0 GAD7:0

## 2021-05-25 ENCOUNTER — Ambulatory Visit: Payer: Medicaid Other | Attending: Obstetrics and Gynecology

## 2021-05-25 ENCOUNTER — Ambulatory Visit: Payer: Medicaid Other | Admitting: *Deleted

## 2021-05-25 ENCOUNTER — Other Ambulatory Visit: Payer: Self-pay | Admitting: *Deleted

## 2021-05-25 VITALS — BP 119/70 | HR 89

## 2021-05-25 DIAGNOSIS — O09293 Supervision of pregnancy with other poor reproductive or obstetric history, third trimester: Secondary | ICD-10-CM | POA: Diagnosis not present

## 2021-05-25 DIAGNOSIS — O34219 Maternal care for unspecified type scar from previous cesarean delivery: Secondary | ICD-10-CM

## 2021-05-25 DIAGNOSIS — N83299 Other ovarian cyst, unspecified side: Secondary | ICD-10-CM | POA: Insufficient documentation

## 2021-05-25 DIAGNOSIS — O3483 Maternal care for other abnormalities of pelvic organs, third trimester: Secondary | ICD-10-CM | POA: Diagnosis not present

## 2021-05-25 DIAGNOSIS — Z3A29 29 weeks gestation of pregnancy: Secondary | ICD-10-CM

## 2021-05-25 DIAGNOSIS — O99213 Obesity complicating pregnancy, third trimester: Secondary | ICD-10-CM | POA: Insufficient documentation

## 2021-05-25 DIAGNOSIS — J45909 Unspecified asthma, uncomplicated: Secondary | ICD-10-CM

## 2021-05-25 DIAGNOSIS — O99519 Diseases of the respiratory system complicating pregnancy, unspecified trimester: Secondary | ICD-10-CM | POA: Insufficient documentation

## 2021-05-25 DIAGNOSIS — E668 Other obesity: Secondary | ICD-10-CM

## 2021-05-25 DIAGNOSIS — O99210 Obesity complicating pregnancy, unspecified trimester: Secondary | ICD-10-CM | POA: Insufficient documentation

## 2021-05-25 DIAGNOSIS — O321XX0 Maternal care for breech presentation, not applicable or unspecified: Secondary | ICD-10-CM | POA: Insufficient documentation

## 2021-05-25 DIAGNOSIS — R8271 Bacteriuria: Secondary | ICD-10-CM

## 2021-05-25 DIAGNOSIS — Z3480 Encounter for supervision of other normal pregnancy, unspecified trimester: Secondary | ICD-10-CM | POA: Insufficient documentation

## 2021-05-25 DIAGNOSIS — O99513 Diseases of the respiratory system complicating pregnancy, third trimester: Secondary | ICD-10-CM | POA: Diagnosis not present

## 2021-05-25 DIAGNOSIS — O09299 Supervision of pregnancy with other poor reproductive or obstetric history, unspecified trimester: Secondary | ICD-10-CM

## 2021-05-29 ENCOUNTER — Other Ambulatory Visit: Payer: Medicaid Other

## 2021-06-08 ENCOUNTER — Encounter: Payer: Medicaid Other | Admitting: Obstetrics

## 2021-06-28 ENCOUNTER — Encounter: Payer: Medicaid Other | Admitting: Obstetrics

## 2021-06-29 ENCOUNTER — Other Ambulatory Visit: Payer: Self-pay

## 2021-06-29 ENCOUNTER — Ambulatory Visit: Payer: Medicaid Other | Admitting: *Deleted

## 2021-06-29 ENCOUNTER — Ambulatory Visit: Payer: Medicaid Other | Attending: Obstetrics

## 2021-06-29 VITALS — BP 121/68 | HR 101

## 2021-06-29 DIAGNOSIS — O99213 Obesity complicating pregnancy, third trimester: Secondary | ICD-10-CM | POA: Diagnosis not present

## 2021-06-29 DIAGNOSIS — N83209 Unspecified ovarian cyst, unspecified side: Secondary | ICD-10-CM | POA: Insufficient documentation

## 2021-06-29 DIAGNOSIS — O09293 Supervision of pregnancy with other poor reproductive or obstetric history, third trimester: Secondary | ICD-10-CM | POA: Diagnosis not present

## 2021-06-29 DIAGNOSIS — O99210 Obesity complicating pregnancy, unspecified trimester: Secondary | ICD-10-CM

## 2021-06-29 DIAGNOSIS — R8271 Bacteriuria: Secondary | ICD-10-CM

## 2021-06-29 DIAGNOSIS — O99513 Diseases of the respiratory system complicating pregnancy, third trimester: Secondary | ICD-10-CM | POA: Diagnosis not present

## 2021-06-29 DIAGNOSIS — E669 Obesity, unspecified: Secondary | ICD-10-CM | POA: Diagnosis not present

## 2021-06-29 DIAGNOSIS — O3483 Maternal care for other abnormalities of pelvic organs, third trimester: Secondary | ICD-10-CM | POA: Insufficient documentation

## 2021-06-29 DIAGNOSIS — O34219 Maternal care for unspecified type scar from previous cesarean delivery: Secondary | ICD-10-CM | POA: Insufficient documentation

## 2021-06-29 DIAGNOSIS — J45909 Unspecified asthma, uncomplicated: Secondary | ICD-10-CM | POA: Insufficient documentation

## 2021-06-29 DIAGNOSIS — Z3A34 34 weeks gestation of pregnancy: Secondary | ICD-10-CM | POA: Diagnosis not present

## 2021-06-29 DIAGNOSIS — Z3480 Encounter for supervision of other normal pregnancy, unspecified trimester: Secondary | ICD-10-CM

## 2021-06-30 ENCOUNTER — Telehealth: Payer: Self-pay

## 2021-06-30 NOTE — Telephone Encounter (Signed)
Received call from pt reports low abdominal pain and pressure. Reports +FM, denies vaginal bleeding, denies LOF. ?Informed pt as the baby drops lower into your pelvis in preparation for birth, she may experience increased pain and pressure. ?She is to increase water intake and take OTC Tylenol as directed. If sx's worsen, contact the office or report to MAU after business hours.  ?

## 2021-07-05 ENCOUNTER — Encounter: Payer: Medicaid Other | Admitting: Obstetrics

## 2021-07-25 ENCOUNTER — Encounter (HOSPITAL_COMMUNITY): Payer: Self-pay | Admitting: Obstetrics and Gynecology

## 2021-07-25 ENCOUNTER — Inpatient Hospital Stay (HOSPITAL_COMMUNITY)
Admission: AD | Admit: 2021-07-25 | Discharge: 2021-07-25 | Disposition: A | Discharge status: Home / Self Care | Payer: Medicaid Other | Attending: Obstetrics and Gynecology | Admitting: Obstetrics and Gynecology

## 2021-07-25 ENCOUNTER — Other Ambulatory Visit: Payer: Self-pay

## 2021-07-25 DIAGNOSIS — O26893 Other specified pregnancy related conditions, third trimester: Secondary | ICD-10-CM | POA: Insufficient documentation

## 2021-07-25 DIAGNOSIS — Z3A38 38 weeks gestation of pregnancy: Secondary | ICD-10-CM | POA: Diagnosis not present

## 2021-07-25 DIAGNOSIS — O4693 Antepartum hemorrhage, unspecified, third trimester: Secondary | ICD-10-CM | POA: Diagnosis present

## 2021-07-25 DIAGNOSIS — R58 Hemorrhage, not elsewhere classified: Secondary | ICD-10-CM | POA: Diagnosis not present

## 2021-07-25 DIAGNOSIS — R350 Frequency of micturition: Secondary | ICD-10-CM | POA: Diagnosis not present

## 2021-07-25 DIAGNOSIS — O200 Threatened abortion: Secondary | ICD-10-CM | POA: Diagnosis not present

## 2021-07-25 DIAGNOSIS — O479 False labor, unspecified: Secondary | ICD-10-CM

## 2021-07-25 DIAGNOSIS — O26899 Other specified pregnancy related conditions, unspecified trimester: Secondary | ICD-10-CM | POA: Diagnosis not present

## 2021-07-25 DIAGNOSIS — N930 Postcoital and contact bleeding: Secondary | ICD-10-CM

## 2021-07-25 DIAGNOSIS — O471 False labor at or after 37 completed weeks of gestation: Secondary | ICD-10-CM | POA: Diagnosis not present

## 2021-07-25 DIAGNOSIS — I10 Essential (primary) hypertension: Secondary | ICD-10-CM | POA: Diagnosis not present

## 2021-07-25 LAB — URINALYSIS, ROUTINE W REFLEX MICROSCOPIC
Bilirubin Urine: NEGATIVE
Glucose, UA: NEGATIVE mg/dL
Hgb urine dipstick: NEGATIVE
Ketones, ur: 20 mg/dL — AB
Nitrite: NEGATIVE
Protein, ur: NEGATIVE mg/dL
Specific Gravity, Urine: 1.006 (ref 1.005–1.030)
pH: 6 (ref 5.0–8.0)

## 2021-07-25 NOTE — MAU Note (Incomplete)
.  Holly Greene is a 26 y.o. at [redacted]w[redacted]d here in MAU reporting: EMS arrival. Pt c/o some vaginal bleeding on and off since yesterday ( did have intercourse yesterday)also c/o pelvic pressure when she urinates.  Good fetal movement reported.  ? ?Onset of complaint: yesterday ?Pain score:  ?There were no vitals filed for this visit.   ?FHT: ?Lab orders placed from triage:  u/a ? ?

## 2021-07-25 NOTE — MAU Provider Note (Signed)
CC:  ?Chief Complaint  ?Patient presents with  ? Vaginal Bleeding  ? Laboring  ? ? ? Event Date/Time  ? First Provider Initiated Contact with Patient 07/25/21 1329   ?  ? ?HPI: Holly Greene is a 26 y.o. year old G69P2012 female at [redacted]w[redacted]d weeks gestation who presents to MAU reporting moderate contractions since this morning and scant pick spotting since last night Cam to MAU by EMS for labor check. Had IC yesterday. . ? ?Associated Sx: Frequent urination. Neg for hematuria, urgency, dysuria or vaginal discharge.  ?Vaginal bleeding: Spotting ?Leaking of fluid: Denies ?Fetal movement: Normal ? ?O: Patient Vitals for the past 24 hrs: ? BP Temp Pulse Resp SpO2  ?07/25/21 1402 111/83 -- 81 -- --  ?07/25/21 1235 125/78 -- 93 -- --  ?07/25/21 1233 -- 98.1 ?F (36.7 ?C) -- 16 99 %  ? ? ?General: NAD ?Heart: Regular rate ?Lungs: Normal rate and effort ?Abd: Soft, NT, Gravid, S=D ?Pelvic: NEFG, no LOF or blood per RN.   ?Dilation: Closed ?Effacement (%): Thick ?Exam by:: Zenia Resides, RN ? ?EFM: 145, Moderate variability, 15 x 15 accelerations, no decelerations ?Toco: Contractions every 5-13 minutes, mild-mod ? ?Results for orders placed or performed during the hospital encounter of 07/25/21 (from the past 24 hour(s))  ?Urinalysis, Routine w reflex microscopic Urine, Clean Catch     Status: Abnormal  ? Collection Time: 07/25/21  1:19 PM  ?Result Value Ref Range  ? Color, Urine YELLOW YELLOW  ? APPearance HAZY (A) CLEAR  ? Specific Gravity, Urine 1.006 1.005 - 1.030  ? pH 6.0 5.0 - 8.0  ? Glucose, UA NEGATIVE NEGATIVE mg/dL  ? Hgb urine dipstick NEGATIVE NEGATIVE  ? Bilirubin Urine NEGATIVE NEGATIVE  ? Ketones, ur 20 (A) NEGATIVE mg/dL  ? Protein, ur NEGATIVE NEGATIVE mg/dL  ? Nitrite NEGATIVE NEGATIVE  ? Leukocytes,Ua SMALL (A) NEGATIVE  ? RBC / HPF 0-5 0 - 5 RBC/hpf  ? WBC, UA 0-5 0 - 5 WBC/hpf  ? Bacteria, UA RARE (A) NONE SEEN  ? Squamous Epithelial / LPF 11-20 0 - 5  ? ? ? ?Orders Placed This Encounter  ?Procedures  ?  Urinalysis, Routine w reflex microscopic Urine, Clean Catch  ? Discharge patient  ? ?MAU Course/MDM ?-Irregular contractions without cervical change.  No evidence of active labor.  ? ?- Spotting c/w Nml bloody show or post-coital spotting. Low suspicion for abruption or other emergent condition.  No bleeding throughout MAU visit. ? ?- Urinary frequency.  UA essentially normal without evidence of infection.  Urinary frequency likely due to contractions and late gestational age. ? ?A: [redacted]w[redacted]d week IUP ?1. False labor   ?2. PCB (post coital bleeding)   ?3. Urinary frequency   ?4. [redacted] weeks gestation of pregnancy   ?FHR reactive ? ?P: Discharge home in stable condition. ?Labor labor precautions and fetal kick counts. ?Follow-up as scheduled for prenatal visit or sooner as needed if symptoms worsen. ?Return to maternity admissions as needed if symptoms worsen. ? ?Katrinka Blazing, [place], CNM ?07/25/2021 ?2:32 PM ? ?3 ? ? ? ? ?

## 2021-07-27 ENCOUNTER — Encounter: Payer: Self-pay | Admitting: Obstetrics and Gynecology

## 2021-08-02 ENCOUNTER — Inpatient Hospital Stay (HOSPITAL_COMMUNITY)
Admission: AD | Admit: 2021-08-02 | Discharge: 2021-08-02 | Disposition: A | Discharge status: Home / Self Care | Payer: Medicaid Other | Attending: Obstetrics and Gynecology | Admitting: Obstetrics and Gynecology

## 2021-08-02 ENCOUNTER — Encounter (HOSPITAL_COMMUNITY): Payer: Self-pay | Admitting: Obstetrics and Gynecology

## 2021-08-02 DIAGNOSIS — Z3A39 39 weeks gestation of pregnancy: Secondary | ICD-10-CM | POA: Insufficient documentation

## 2021-08-02 DIAGNOSIS — R8271 Bacteriuria: Secondary | ICD-10-CM

## 2021-08-02 DIAGNOSIS — O471 False labor at or after 37 completed weeks of gestation: Secondary | ICD-10-CM | POA: Insufficient documentation

## 2021-08-02 DIAGNOSIS — Z3480 Encounter for supervision of other normal pregnancy, unspecified trimester: Secondary | ICD-10-CM

## 2021-08-02 DIAGNOSIS — O479 False labor, unspecified: Secondary | ICD-10-CM

## 2021-08-02 DIAGNOSIS — O99210 Obesity complicating pregnancy, unspecified trimester: Secondary | ICD-10-CM

## 2021-08-02 NOTE — MAU Provider Note (Signed)
Holly Greene is a 26 y.o. [phone] female at [redacted]w[redacted]d. ? ?RN labor check, not seen by provider. ? ?SVE by RN: Dilation: Closed ?Effacement (%): Thick ?Exam by:: Jeanice Lim Flippin RN ? ?NST: FHR baseline 140 bpm, Variability: moderate, Accelerations: present, Decelerations:  Absent = Cat 1/Reactive ?Toco: Irregular, every 9-14 minutes   ? ?Assessment and Plan:  ?- SVE unchanged upon recheck by RN ?- VSS, Cat 1/reactive NST ?- Stable for discharge home with return precautions  ? ?Worthy Rancher, MD ? ?08/02/2021 ?10:35 AM ? ?

## 2021-08-02 NOTE — MAU Note (Signed)
.  Holly Greene is a 26 y.o. at [redacted]w[redacted]d here in MAU reporting: ctx that started last night, now are every five minutes apart. Denies VB or LOF. +FM.  ? ?Pain score: 8 ? ?

## 2021-08-04 ENCOUNTER — Inpatient Hospital Stay (HOSPITAL_COMMUNITY)
Admission: AD | Admit: 2021-08-04 | Discharge: 2021-08-05 | DRG: 807 | Disposition: A | Discharge status: Home / Self Care | Payer: Medicaid Other | Attending: Family Medicine | Admitting: Family Medicine

## 2021-08-04 ENCOUNTER — Other Ambulatory Visit: Payer: Self-pay

## 2021-08-04 ENCOUNTER — Inpatient Hospital Stay (HOSPITAL_COMMUNITY): Payer: Medicaid Other | Admitting: Anesthesiology

## 2021-08-04 ENCOUNTER — Encounter (HOSPITAL_COMMUNITY): Payer: Self-pay | Admitting: Obstetrics and Gynecology

## 2021-08-04 DIAGNOSIS — O99820 Streptococcus B carrier state complicating pregnancy: Secondary | ICD-10-CM | POA: Diagnosis not present

## 2021-08-04 DIAGNOSIS — R8271 Bacteriuria: Secondary | ICD-10-CM | POA: Diagnosis present

## 2021-08-04 DIAGNOSIS — O99214 Obesity complicating childbirth: Secondary | ICD-10-CM | POA: Diagnosis present

## 2021-08-04 DIAGNOSIS — O99210 Obesity complicating pregnancy, unspecified trimester: Secondary | ICD-10-CM | POA: Diagnosis present

## 2021-08-04 DIAGNOSIS — Z3A39 39 weeks gestation of pregnancy: Secondary | ICD-10-CM

## 2021-08-04 DIAGNOSIS — O26893 Other specified pregnancy related conditions, third trimester: Secondary | ICD-10-CM | POA: Diagnosis not present

## 2021-08-04 DIAGNOSIS — O09299 Supervision of pregnancy with other poor reproductive or obstetric history, unspecified trimester: Secondary | ICD-10-CM

## 2021-08-04 DIAGNOSIS — O34211 Maternal care for low transverse scar from previous cesarean delivery: Secondary | ICD-10-CM | POA: Diagnosis not present

## 2021-08-04 DIAGNOSIS — Z3480 Encounter for supervision of other normal pregnancy, unspecified trimester: Secondary | ICD-10-CM

## 2021-08-04 DIAGNOSIS — O34219 Maternal care for unspecified type scar from previous cesarean delivery: Secondary | ICD-10-CM | POA: Diagnosis present

## 2021-08-04 DIAGNOSIS — O99824 Streptococcus B carrier state complicating childbirth: Secondary | ICD-10-CM | POA: Diagnosis not present

## 2021-08-04 DIAGNOSIS — O4202 Full-term premature rupture of membranes, onset of labor within 24 hours of rupture: Secondary | ICD-10-CM | POA: Diagnosis not present

## 2021-08-04 DIAGNOSIS — O164 Unspecified maternal hypertension, complicating childbirth: Secondary | ICD-10-CM | POA: Diagnosis not present

## 2021-08-04 DIAGNOSIS — Z98891 History of uterine scar from previous surgery: Secondary | ICD-10-CM

## 2021-08-04 LAB — CBC
HCT: 30.1 % — ABNORMAL LOW (ref 36.0–46.0)
Hemoglobin: 10 g/dL — ABNORMAL LOW (ref 12.0–15.0)
MCH: 28.8 pg (ref 26.0–34.0)
MCHC: 33.2 g/dL (ref 30.0–36.0)
MCV: 86.7 fL (ref 80.0–100.0)
Platelets: 230 10*3/uL (ref 150–400)
RBC: 3.47 MIL/uL — ABNORMAL LOW (ref 3.87–5.11)
RDW: 13.7 % (ref 11.5–15.5)
WBC: 9.9 10*3/uL (ref 4.0–10.5)
nRBC: 0 % (ref 0.0–0.2)

## 2021-08-04 LAB — RPR: RPR Ser Ql: NONREACTIVE

## 2021-08-04 LAB — COMPREHENSIVE METABOLIC PANEL
ALT: 8 U/L (ref 0–44)
AST: 40 U/L (ref 15–41)
Albumin: 3 g/dL — ABNORMAL LOW (ref 3.5–5.0)
Alkaline Phosphatase: 219 U/L — ABNORMAL HIGH (ref 38–126)
Anion gap: 10 (ref 5–15)
BUN: <5 mg/dL — ABNORMAL LOW (ref 6–20)
CO2: 21 mmol/L — ABNORMAL LOW (ref 22–32)
Calcium: 9 mg/dL (ref 8.9–10.3)
Chloride: 108 mmol/L (ref 98–111)
Creatinine, Ser: 0.64 mg/dL (ref 0.44–1.00)
GFR, Estimated: >60 mL/min (ref >60)
Glucose, Bld: 89 mg/dL (ref 70–99)
Potassium: 3.5 mmol/L (ref 3.5–5.1)
Sodium: 139 mmol/L (ref 135–145)
Total Bilirubin: 0.5 mg/dL (ref 0.3–1.2)
Total Protein: 6.3 g/dL — ABNORMAL LOW (ref 6.5–8.1)

## 2021-08-04 LAB — TYPE AND SCREEN
ABO/RH(D): O POS
Antibody Screen: NEGATIVE

## 2021-08-04 LAB — PROTEIN / CREATININE RATIO, URINE
Creatinine, Urine: 129.17 mg/dL
Protein Creatinine Ratio: 0.09 mg/mg{Cre} (ref 0.00–0.15)
Total Protein, Urine: 11 mg/dL

## 2021-08-04 MED ORDER — LACTATED RINGERS IV SOLN
INTRAVENOUS | Status: DC
  Administered 2021-08-04: 125 mL/h via INTRAVENOUS

## 2021-08-04 MED ORDER — LIDOCAINE HCL (PF) 1 % IJ SOLN
INTRAMUSCULAR | Status: DC | PRN
  Administered 2021-08-04: 8 mL via EPIDURAL

## 2021-08-04 MED ORDER — OXYTOCIN BOLUS FROM INFUSION
333.0000 mL | Freq: Once | INTRAVENOUS | Status: AC
  Administered 2021-08-04: 333 mL via INTRAVENOUS

## 2021-08-04 MED ORDER — LACTATED RINGERS AMNIOINFUSION
INTRAVENOUS | Status: DC
  Administered 2021-08-04: 125 mL/h via INTRAUTERINE

## 2021-08-04 MED ORDER — FENTANYL-BUPIVACAINE-NACL 0.5-0.125-0.9 MG/250ML-% EP SOLN
12.0000 mL/h | EPIDURAL | Status: DC | PRN
  Administered 2021-08-04: 12 mL/h via EPIDURAL
  Filled 2021-08-04: qty 250

## 2021-08-04 MED ORDER — ACETAMINOPHEN 325 MG PO TABS: 650.0000 mg | ORAL_TABLET | ORAL | Status: DC | PRN

## 2021-08-04 MED ORDER — WITCH HAZEL-GLYCERIN EX PADS: 1.0000 "application " | MEDICATED_PAD | CUTANEOUS | Status: DC | PRN

## 2021-08-04 MED ORDER — FENTANYL CITRATE (PF) 100 MCG/2ML IJ SOLN
100.0000 ug | INTRAMUSCULAR | Status: DC | PRN
  Administered 2021-08-04 (×2): 100 ug via INTRAVENOUS
  Filled 2021-08-04 (×3): qty 2

## 2021-08-04 MED ORDER — PHENYLEPHRINE 80 MCG/ML (10ML) SYRINGE FOR IV PUSH (FOR BLOOD PRESSURE SUPPORT): 80.0000 ug | PREFILLED_SYRINGE | INTRAVENOUS | Status: DC | PRN

## 2021-08-04 MED ORDER — IBUPROFEN 600 MG PO TABS
600.0000 mg | ORAL_TABLET | Freq: Four times a day (QID) | ORAL | Status: DC
  Administered 2021-08-04 – 2021-08-05 (×4): 600 mg via ORAL
  Filled 2021-08-04 (×4): qty 1

## 2021-08-04 MED ORDER — PRENATAL MULTIVITAMIN CH
1.0000 | ORAL_TABLET | Freq: Every day | ORAL | Status: DC
  Administered 2021-08-05: 1 via ORAL
  Filled 2021-08-04: qty 1

## 2021-08-04 MED ORDER — DIBUCAINE (PERIANAL) 1 % EX OINT: 1.0000 "application " | TOPICAL_OINTMENT | CUTANEOUS | Status: DC | PRN

## 2021-08-04 MED ORDER — LACTATED RINGERS IV SOLN: 500.0000 mL | Freq: Once | INTRAVENOUS | Status: DC

## 2021-08-04 MED ORDER — OXYCODONE-ACETAMINOPHEN 5-325 MG PO TABS: 2.0000 | ORAL_TABLET | ORAL | Status: DC | PRN

## 2021-08-04 MED ORDER — BUPIVACAINE HCL (PF) 0.5 % IJ SOLN
INTRAMUSCULAR | Status: DC | PRN
  Administered 2021-08-04: 8 mL

## 2021-08-04 MED ORDER — DIPHENHYDRAMINE HCL 50 MG/ML IJ SOLN: 12.5000 mg | INTRAMUSCULAR | Status: DC | PRN

## 2021-08-04 MED ORDER — SODIUM BICARBONATE 8.4 % IV SOLN
INTRAVENOUS | Status: DC | PRN
  Administered 2021-08-04: 6 mL via EPIDURAL

## 2021-08-04 MED ORDER — EPHEDRINE 5 MG/ML INJ: 10.0000 mg | INTRAVENOUS | Status: DC | PRN

## 2021-08-04 MED ORDER — DIPHENHYDRAMINE HCL 25 MG PO CAPS: 25.0000 mg | ORAL_CAPSULE | Freq: Four times a day (QID) | ORAL | Status: DC | PRN

## 2021-08-04 MED ORDER — FENTANYL-BUPIVACAINE-NACL 0.5-0.125-0.9 MG/250ML-% EP SOLN: 12.0000 mL/h | EPIDURAL | Status: DC | PRN

## 2021-08-04 MED ORDER — SOD CITRATE-CITRIC ACID 500-334 MG/5ML PO SOLN: 30.0000 mL | ORAL | Status: DC | PRN

## 2021-08-04 MED ORDER — ONDANSETRON HCL 4 MG/2ML IJ SOLN: 4.0000 mg | Freq: Four times a day (QID) | INTRAMUSCULAR | Status: DC | PRN

## 2021-08-04 MED ORDER — LIDOCAINE HCL (PF) 1 % IJ SOLN: 30.0000 mL | INTRAMUSCULAR | Status: DC | PRN

## 2021-08-04 MED ORDER — SENNOSIDES-DOCUSATE SODIUM 8.6-50 MG PO TABS
2.0000 | ORAL_TABLET | Freq: Every day | ORAL | Status: DC
  Administered 2021-08-05: 2 via ORAL
  Filled 2021-08-04: qty 2

## 2021-08-04 MED ORDER — SODIUM CHLORIDE 0.9 % IV SOLN
1.0000 g | INTRAVENOUS | Status: DC
  Administered 2021-08-04: 1 g via INTRAVENOUS
  Filled 2021-08-04: qty 1000

## 2021-08-04 MED ORDER — SODIUM CHLORIDE 0.9 % IV SOLN
2.0000 g | Freq: Once | INTRAVENOUS | Status: AC
  Administered 2021-08-04: 2 g via INTRAVENOUS
  Filled 2021-08-04: qty 2000

## 2021-08-04 MED ORDER — SIMETHICONE 80 MG PO CHEW: 80.0000 mg | CHEWABLE_TABLET | ORAL | Status: DC | PRN

## 2021-08-04 MED ORDER — ONDANSETRON HCL 4 MG PO TABS: 4.0000 mg | ORAL_TABLET | ORAL | Status: DC | PRN

## 2021-08-04 MED ORDER — ACETAMINOPHEN 325 MG PO TABS
650.0000 mg | ORAL_TABLET | ORAL | Status: DC | PRN
  Administered 2021-08-04 – 2021-08-05 (×4): 650 mg via ORAL
  Filled 2021-08-04 (×4): qty 2

## 2021-08-04 MED ORDER — FENTANYL CITRATE (PF) 100 MCG/2ML IJ SOLN
INTRAMUSCULAR | Status: DC | PRN
  Administered 2021-08-04 (×2): 100 ug via EPIDURAL

## 2021-08-04 MED ORDER — BENZOCAINE-MENTHOL 20-0.5 % EX AERO
1.0000 "application " | INHALATION_SPRAY | CUTANEOUS | Status: DC | PRN
  Administered 2021-08-04: 1 via TOPICAL
  Filled 2021-08-04: qty 56

## 2021-08-04 MED ORDER — LACTATED RINGERS IV SOLN
500.0000 mL | INTRAVENOUS | Status: DC | PRN
  Administered 2021-08-04 (×2): 500 mL via INTRAVENOUS

## 2021-08-04 MED ORDER — COCONUT OIL OIL: 1.0000 "application " | TOPICAL_OIL | Status: DC | PRN

## 2021-08-04 MED ORDER — OXYCODONE-ACETAMINOPHEN 5-325 MG PO TABS: 1.0000 | ORAL_TABLET | ORAL | Status: DC | PRN

## 2021-08-04 MED ORDER — FERROUS SULFATE 325 (65 FE) MG PO TABS
325.0000 mg | ORAL_TABLET | ORAL | Status: DC
  Administered 2021-08-05: 325 mg via ORAL
  Filled 2021-08-04: qty 1

## 2021-08-04 MED ORDER — TETANUS-DIPHTH-ACELL PERTUSSIS 5-2.5-18.5 LF-MCG/0.5 IM SUSY: 0.5000 mL | PREFILLED_SYRINGE | Freq: Once | INTRAMUSCULAR | Status: DC

## 2021-08-04 MED ORDER — OXYTOCIN-SODIUM CHLORIDE 30-0.9 UT/500ML-% IV SOLN
2.5000 [IU]/h | INTRAVENOUS | Status: DC
  Filled 2021-08-04: qty 500

## 2021-08-04 MED ORDER — ONDANSETRON HCL 4 MG/2ML IJ SOLN: 4.0000 mg | INTRAMUSCULAR | Status: DC | PRN

## 2021-08-04 NOTE — Anesthesia Procedure Notes (Deleted)
Epidural

## 2021-08-04 NOTE — MAU Note (Signed)
...  Holly Greene is a 26 y.o. at [redacted]w[redacted]d here in MAU via EMS reporting: CTX for the past two days that became stronger throughout the night. EMS states her CTX were every 4 minutes on the truck. +FM. No VB or LOF. ? ?GBS+ ? ?Pain score:  ?10/10 lower back ?10/10 lower abdomen ? ?FHT: 140 initial external ?Lab orders placed from triage:  MAU Labor ? ?

## 2021-08-04 NOTE — Discharge Summary (Signed)
? ?  Postpartum Discharge Summary ? ?Date of Service updated 08/05/21 ? ?   ?Patient Name: Holly Greene ?DOB: 09/25/1995 ?MRN: [id number] ? ?Date of admission: 08/04/2021 ?Delivery date:08/04/2021  ?Delivering provider: Genia Del  ?Date of discharge: 08/05/2021 ? ?Admitting diagnosis: Uterine contractions during pregnancy [O47.9] ?Intrauterine pregnancy: [redacted]w[redacted]d    ?Secondary diagnosis:  Principal Problem: ?  VBAC (vaginal birth after Cesarean) ?Active Problems: ?  Hx of preeclampsia, prior pregnancy, currently pregnant ?  History of cesarean section ?  Supervision of other normal pregnancy, antepartum ?  Maternal obesity affecting pregnancy, antepartum ?  GBS bacteriuria ?  Normal labor ? ?Additional problems: none    ?Discharge diagnosis: Term Pregnancy Delivered and VBAC                                              ?Post partum procedures: none ?Augmentation:  None ?Complications: None ? ?Hospital course: Onset of Labor With Vaginal Delivery      ?26y.o. yo [id number] 380w4das admitted in Active Labor on 08/04/2021. Patient had an uncomplicated labor course and progressed to complete after SROM. She had an uncomplicated VBAC.  ?Membrane Rupture Time/Date: 9:58 AM ,08/04/2021   ?Delivery Method:Vaginal, Spontaneous  ?Episiotomy: None  ?Lacerations:  None  ?Patient had an uncomplicated postpartum course.  She is ambulating, tolerating a regular diet, passing flatus, and urinating well.  Patient is discharged home in stable condition on 08/05/21. ? ?Newborn Data: ?Birth date:08/04/2021  ?Birth time:3:32 PM  ?Gender:Female  ?Living status:Living  ?Apgars:8 ,8  ?Weight:3370 g  ? ?Magnesium Sulfate received: No ?BMZ received: No ?Rhophylac: N/A ?MMR: N/A - Immune  ?T-DaP: Offered postpartum  ?Flu: Declined  ?Transfusion: No ? ?Physical exam  ?Vitals:  ? 08/04/21 1940 08/04/21 2230 08/05/21 0230 08/05/21 0553  ?BP: 126/85 122/80 118/76 121/77  ?Pulse: 88 84 80 82  ?Resp: 17 16 16 18   ?Temp: 99.4 ?F (37.4 ?C) 98.8 ?F  (37.1 ?C) 98.6 ?F (37 ?C) 98.5 ?F (36.9 ?C)  ?TempSrc: Oral Oral Oral Oral  ?SpO2: 100% 100% 100% 100%  ? ?General: alert, cooperative, and no distress ?Lochia: appropriate ?Uterine Fundus: firm ?Incision: n/a ?DVT Evaluation: No evidence of DVT seen on physical exam. ? ?Labs: ?Lab Results  ?Component Value Date  ? WBC 9.9 08/04/2021  ? HGB 10.0 (L) 08/04/2021  ? HCT 30.1 (L) 08/04/2021  ? MCV 86.7 08/04/2021  ? PLT 230 08/04/2021  ? ? ?  Latest Ref Rng & Units 08/04/2021  ?  7:58 AM  ?CMP  ?Glucose 70 - 99 mg/dL 89    ?BUN 6 - 20 mg/dL <5    ?Creatinine 0.44 - 1.00 mg/dL 0.64    ?Sodium 135 - 145 mmol/L 139    ?Potassium 3.5 - 5.1 mmol/L 3.5    ?Chloride 98 - 111 mmol/L 108    ?CO2 22 - 32 mmol/L 21    ?Calcium 8.9 - 10.3 mg/dL 9.0    ?Total Protein 6.5 - 8.1 g/dL 6.3    ?Total Bilirubin 0.3 - 1.2 mg/dL 0.5    ?Alkaline Phos 38 - 126 U/L 219    ?AST 15 - 41 U/L 40    ?ALT 0 - 44 U/L 8    ? ?Edinburgh Score: ? ?  08/04/2021  ?  7:40 PM  ?EdFlavia Shipperostnatal Depression Scale Screening Tool  ?I have  been able to laugh and see the funny side of things. 0  ?I have looked forward with enjoyment to things. 0  ?I have blamed myself unnecessarily when things went wrong. 1  ?I have been anxious or worried for no good reason. 0  ?I have felt scared or panicky for no good reason. 0  ?Things have been getting on top of me. 1  ?I have been so unhappy that I have had difficulty sleeping. 0  ?I have felt sad or miserable. 0  ?I have been so unhappy that I have been crying. 1  ?The thought of harming myself has occurred to me. 0  ?Edinburgh Postnatal Depression Scale Total 3  ? ? ? ?After visit meds:  ?Allergies as of 08/05/2021   ?No Known Allergies ?  ? ?  ?Medication List  ?  ? ?STOP taking these medications   ? ?aspirin EC 81 MG tablet ?  ?Blood Pressure Kit Devi ?  ?Gojji Weight Scale Misc ?  ? ?  ? ?TAKE these medications   ? ?acetaminophen 325 MG tablet ?Commonly known as: Tylenol ?Take 2 tablets (650 mg total) by mouth every 4  (four) hours as needed (for pain scale < 4). ?  ?ibuprofen 600 MG tablet ?Commonly known as: ADVIL ?Take 1 tablet (600 mg total) by mouth every 6 (six) hours. ?  ?prenatal vitamin w/FE, FA 27-1 MG Tabs tablet ?Take 1 tablet by mouth daily at 12 noon. ?  ? ?  ? ? ? ?Discharge home in stable condition ?Infant Feeding: Breast ?Infant Disposition: home with mother ?Discharge instruction: per After Visit Summary and Postpartum booklet. ?Activity: Advance as tolerated. Pelvic rest for 6 weeks.  ?Diet: routine diet ?Future Appointments:No future appointments. ?Follow up Visit: ? Follow-up Information   ? ? [facility] Follow up.   ?Why: In 4-6 weeks for postpartum visit ?Contact information: ?[address] ?[place] [address] ?[phone] ? ?  ?  ? ?  ?  ? ?  ? ?Message sent to [facility] by Dr. Gwenlyn Perking on 08/04/21.  ? ?Please schedule this patient for a In person postpartum visit in 6 weeks with the following provider: Any provider. ?Additional Postpartum F/U:  None   ?High risk pregnancy complicated by: Hx of CS, obesity, hx of pre-E in prior pregnancy  ?Delivery mode:  Vaginal, Spontaneous  ?Anticipated Birth Control:  Condoms ? ?08/05/2021 ?Fatima Blank, CNM ? ? ? ?

## 2021-08-04 NOTE — Anesthesia Postprocedure Evaluation (Signed)
Anesthesia Post Note ? ?Patient: Holly Greene ? ?Procedure(s) Performed: AN AD HOC LABOR EPIDURAL ? ?  ? ?Patient location during evaluation: Mother Baby ?Anesthesia Type: Epidural ?Level of consciousness: awake and alert ?Pain management: pain level controlled ?Vital Signs Assessment: post-procedure vital signs reviewed and stable ?Respiratory status: spontaneous breathing, nonlabored ventilation and respiratory function stable ?Cardiovascular status: stable ?Postop Assessment: no headache, no backache and epidural receding ?Anesthetic complications: no ? ? ?No notable events documented. ? ?Last Vitals:  ?Vitals:  ? 08/04/21 1631 08/04/21 1728  ?BP: 132/72 126/81  ?Pulse: (!) 101   ?Resp: 18 18  ?Temp:  37.4 ?C  ?SpO2:  99%  ?  ?Last Pain:  ?Vitals:  ? 08/04/21 1728  ?TempSrc: Oral  ?PainSc: 0-No pain  ? ?Pain Goal:   ? ?  ?  ?  ?  ?  ?  ?Epidural/Spinal Function Cutaneous sensation: Normal sensation (08/04/21 1728), Patient able to flex knees: Yes (08/04/21 1728), Patient able to lift hips off bed: Yes (08/04/21 1728), Back pain beyond tenderness at insertion site: No (08/04/21 1728), Progressively worsening motor and/or sensory loss: No (08/04/21 1728), Bowel and/or bladder incontinence post epidural: No (08/04/21 1728) ? ?Domani Bakos ? ? ? ? ?

## 2021-08-04 NOTE — Progress Notes (Signed)
Went in to introduce myself to patient as Dr. Mathis Fare stepping out briefly ? ?Patient reports feeling a lot of pain with contractions still. Anesthesia at bedside for redosing epidural. Patient more comfortable after that. ? ?Patient also having some rounds of repetitive variables after SROM and therefore discussed IUPC and amnioinfusion. Patient amenable. ? ?IUPC placed. After position change after IUPC variable resolved and moderate variability and Cat I tracing. Amnioinfusion not started at this time but plan to do in future if starts to have repetitive variable again. ? ?Warner Mccreedy, MD, MPH ?OB Fellow, Faculty Practice ? ?

## 2021-08-04 NOTE — Anesthesia Preprocedure Evaluation (Signed)
Anesthesia Evaluation  ?Patient identified by MRN, date of birth, ID band ?Patient awake ? ? ? ?Reviewed: ?Allergy & Precautions, H&P , NPO status , Patient's Chart, lab work & pertinent test results, reviewed documented beta blocker date and time  ? ?Airway ?Mallampati: I ? ?TM Distance: >3 FB ?Neck ROM: full ? ? ? Dental ?no notable dental hx. ?(+) Teeth Intact, Dental Advisory Given ?  ?Pulmonary ?neg pulmonary ROS,  ?  ?Pulmonary exam normal ?breath sounds clear to auscultation ? ? ? ? ? ? Cardiovascular ?hypertension, negative cardio ROS ?Normal cardiovascular exam ?Rhythm:regular Rate:Normal ? ? ?  ?Neuro/Psych ?PSYCHIATRIC DISORDERS Bipolar Disorder negative neurological ROS ?   ? GI/Hepatic ?negative GI ROS, Neg liver ROS,   ?Endo/Other  ?negative endocrine ROS ? Renal/GU ?negative Renal ROS  ?negative genitourinary ?  ?Musculoskeletal ? ? Abdominal ?  ?Peds ? Hematology ?negative hematology ROS ?(+)   ?Anesthesia Other Findings ? ? Reproductive/Obstetrics ?(+) Pregnancy ? ?  ? ? ? ? ? ? ? ? ? ? ? ? ? ?  ?  ? ? ? ? ? ? ? ? ?Anesthesia Physical ?Anesthesia Plan ? ?ASA: 2 ? ?Anesthesia Plan: Epidural  ? ?Post-op Pain Management:   ? ?Induction:  ? ?PONV Risk Score and Plan: 2 and Treatment may vary due to age or medical condition ? ?Airway Management Planned: Natural Airway ? ?Additional Equipment: None ? ?Intra-op Plan:  ? ?Post-operative Plan:  ? ?Informed Consent: I have reviewed the patients History and Physical, chart, labs and discussed the procedure including the risks, benefits and alternatives for the proposed anesthesia with the patient or authorized representative who has indicated his/her understanding and acceptance.  ? ? ? ?Dental Advisory Given ? ?Plan Discussed with: Anesthesiologist and CRNA ? ?Anesthesia Plan Comments: (Labs checked- platelets confirmed with RN in room. Fetal heart tracing, per RN, reported to be stable enough for sitting  procedure. ?Discussed epidural, and patient consents to the procedure:  included risk of possible headache,backache, failed block, allergic reaction, and nerve injury. This patient was asked if she had any questions or concerns before the procedure started.)  ? ? ? ? ? ? ?Anesthesia Quick Evaluation ? ?

## 2021-08-04 NOTE — H&P (Addendum)
OBSTETRIC ADMISSION HISTORY AND PHYSICAL ? ?Holly Greene is a 26 y.o. female [phone] with IUP at 38w4dby LMP presenting for CTX every 4 min. Patient reports CTX for past two days that became stronger overnight. EMS reports CTX every 4 min en route. Patient was experiencing 10/10 lower back and lower abdominal pain in MAU that has now had some relief with epidural placement. She reports +FMs, no LOF, no VB, no blurry vision, headaches, peripheral edema, or RUQ pain.  She plans on breast feeding. She plans to use condoms for birth control postpartum.  ? ?She received her prenatal care at [facility]. ? ?Dating: By LMP --->  Estimated Date of Delivery: 08/07/21 ? ?Sono:   ?_0 , CWD, normal anatomy, cephalic presentation, posterior placental lie, 2315g, 31% EFW ? ?Prenatal History/Complications: ?Hx of preeclampsia in prior pregnancy ?Hx of C-section for failure to progress in 2019 (desires VBAC); SVD in 2018 ?GBS bacteriuria (03/20/2021)  ?Maternal obesity affecting pregnancy ?Bipolar disorder (no medications) ?Asthma ? ?Past Medical History: ?Past Medical History:  ?Diagnosis Date  ? Asthma   ? as a child  ? Bipolar 1 disorder ([place]   ? Defiant behavior   ? Hypertension   ? Knee dislocation   ? ? ?Past Surgical History: ?Past Surgical History:  ?Procedure Laterality Date  ? CESAREAN SECTION N/A 06/29/2017  ? Procedure: CESAREAN SECTION;  Surgeon: CMora Bellman MD;  [facility]  Service: Obstetrics;  Laterality: N/A;  ? TONSILLECTOMY    ? ? ?Obstetrical History: ?OB History   ? ? Gravida  ?4  ? Para  ?2  ? Term  ?2  ? Preterm  ?   ? AB  ?1  ? Living  ?2  ?  ? ? SAB  ?   ? IAB  ?1  ? Ectopic  ?   ? Multiple  ?0  ? Live Births  ?2  ?   ?  ?  ? ? ?Social History ?Social History  ? ?Socioeconomic History  ? Marital status: Single  ?  Spouse name: Not on file  ? Number of children: Not on file  ? Years of education: Not on file  ? Highest education level: Not on file  ?Occupational History  ? Not on  file  ?Tobacco Use  ? Smoking status: Never  ? Smokeless tobacco: Never  ?Vaping Use  ? Vaping Use: Never used  ?Substance and Sexual Activity  ? Alcohol use: No  ?  Comment: not while preg  ? Drug use: Not Currently  ?  Types: Marijuana  ?  Comment: Stopped once found out was pregnant  ? Sexual activity: Yes  ?  Birth control/protection: None  ?Other Topics Concern  ? Not on file  ?Social History Narrative  ? Not on file  ? ?Social Determinants of Health  ? ?Financial Resource Strain: Not on file  ?Food Insecurity: Not on file  ?Transportation Needs: Not on file  ?Physical Activity: Not on file  ?Stress: Not on file  ?Social Connections: Not on file  ? ? ?Family History: ?Family History  ?Problem Relation Age of Onset  ? Diabetes Mother   ? Hypertension Mother   ? Diabetes Maternal Grandmother   ? Hypertension Maternal Grandmother   ? ? ?Allergies: ?No Known Allergies ? ?Medications Prior to Admission  ?Medication Sig Dispense Refill Last Dose  ? aspirin EC 81 MG tablet Take 1 tablet (81 mg total) by mouth daily. Take after 12 weeks for prevention of preeclampsia later  in pregnancy 300 tablet 2 08/04/2021  ? prenatal vitamin w/FE, FA (PRENATAL 1 + 1) 27-1 MG TABS tablet Take 1 tablet by mouth daily at 12 noon.   08/04/2021  ? Blood Pressure Monitoring (BLOOD PRESSURE KIT) DEVI 1 kit by Does not apply route once a week. Check Blood Pressure regularly and record readings into the Babyscripts App.  Large Cuff.  DX O90.0 (Patient not taking: Reported on 05/24/2021) 1 each 0   ? Misc. Devices (GOJJI WEIGHT SCALE) MISC 1 Device by Does not apply route once a week. CHECK WEIGHT WEEKLY AND RECORD DATA INTO BABY SCRIPTS APP (Patient not taking: Reported on 05/24/2021) 1 each 0   ? ? ? ?Review of Systems  ?All systems reviewed and negative except as stated in HPI. ? ?Blood pressure 139/86, pulse 74, temperature 98.3 ?F (36.8 ?C), temperature source Oral, resp. rate 16, last menstrual period 10/31/2020, SpO2 98 %. ? ?Vitals:  ?  08/04/21 0912 08/04/21 0914 08/04/21 0915 08/04/21 0916  ?BP: 135/83 139/86 139/86 139/86  ?Pulse: 71 74 74 74  ?Resp: _0 ?Temp:      ?TempSrc:      ?SpO2:      ?  ?General appearance: cooperative and no distress, in and out of sleep ?Lungs: normal work of breathing on room air  ?Heart: normal rate, warm and well perfused  ?Abdomen: soft, non-tender, gravid  ?Extremities: no LE edema or calf tenderness to palpation  ? ?Presentation: Cephalic per RN ? ?Fetal monitoring: ?Baseline 130, moderate variability, accels present, intermittent variable decels  ?Uterine activity: Regular every 2-5 minutes  ?Dilation: 6 ?Effacement (%): 60 ?Station: -2 ?Exam by:: Ileana Ladd RN ? ?Prenatal labs: ?ABO, Rh: --/--/O POS (04/28 6222) ?Antibody: NEG (04/28 0758) ?Rubella: 3.03 (12/12 1553) ?RPR: Non Reactive (12/12 1553)  ?HBsAg: Negative (12/12 1553)  ?HIV: Non Reactive (12/12 1553)  ?GBS:  Positive ?Glucola not performed (last routine prenatal visit 2/15) ?Genetic screening : NIPS low risk, Horizon negative x4 ?Anatomy US normal ? ?Prenatal Transfer Tool  ?Maternal Diabetes: A1C 5.2 03/20/21 (GTT not performed) ?Genetic Screening: Normal ?Maternal Ultrasounds/Referrals: Normal ?Fetal Ultrasounds or other Referrals: None ?Maternal Substance Abuse:  No ?Significant Maternal Medications:  None ?Significant Maternal Lab Results: Group B Strep positive (bacteruria)  ? ?Results for orders placed or performed during the hospital encounter of 08/04/21 (from the past 24 hour(s))  ?CBC  ? Collection Time: 08/04/21  7:58 AM  ?Result Value Ref Range  ? WBC 9.9 4.0 - 10.5 K/uL  ? RBC 3.47 (L) 3.87 - 5.11 MIL/uL  ? Hemoglobin 10.0 (L) 12.0 - 15.0 g/dL  ? HCT 30.1 (L) 36.0 - 46.0 %  ? MCV 86.7 80.0 - 100.0 fL  ? MCH 28.8 26.0 - 34.0 pg  ? MCHC 33.2 30.0 - 36.0 g/dL  ? RDW 13.7 11.5 - 15.5 %  ? Platelets 230 150 - 400 K/uL  ? nRBC 0.0 0.0 - 0.2 %  ?Type and screen Iatan  ? Collection Time: 08/04/21  7:58 AM   ?Result Value Ref Range  ? ABO/RH(D) O POS   ? Antibody Screen NEG   ? Sample Expiration    ?  08/07/2021,2359 ?Performed at [facility], [place] [address], [place], [place] [address] ?  ? ? ?Patient Active Problem List  ? Diagnosis Date Noted  ? Normal labor 08/04/2021  ? Eczema 04/27/2021  ? GBS bacteriuria 03/27/2021  ? Maternal obesity affecting pregnancy, antepartum 03/20/2021  ?  Supervision of other normal pregnancy, antepartum 02/22/2021  ? History of cesarean section 06/29/2017  ? Bipolar disorder ([place]) 03/06/2017  ? Asthma 03/06/2017  ? Hx of preeclampsia, prior pregnancy, currently pregnant 03/06/2017  ? ? ?Assessment/Plan:  ?Holly Greene is a 26 y.o. female [phone] with IUP at 67w4dby LMP presenting for SOL.  ? ?#Labor: Presents with CTX every 2-5 min, SVE 6/60/-2. Patient desires TOLAC (consented 05/24/21). Risks and benefits discussed again on admission, and patient is agreeable to proceeding with TOLAC. Will continue expectant management and reassess in 3-4 hours, sooner as needed. ?#Pain: Epidural   ?#FWB: Category 2 due to intermittent variable decels with contractions. Continues to have accels and reassuring moderate variability. Will continue to monitor.  ?#ID:  GBS positive; PCN ordered  ?#MOF: Breast ?#MOC: Condoms  ?#Circ:  Yes, desires inpatient  ? ?#Hx of preeclampsia in prior pregnancy: No symptoms at this time. Current BP 139/86, will continue to monitor closely. Will obtain baseline CBC, CMP, urine P:C on admission.  ? ?PEngineer, petroleum Medical Student  ?08/04/2021, 7:52 AM ? ?GME ATTESTATION:  ?I saw and evaluated the patient. I agree with the findings and the plan of care as documented in the student?s note. I have made changes to documentation as necessary. ? ?CVilma Meckel MD ?OB Fellow, Faculty Practice ?[facility] ?08/04/2021 9:59 AM ? ?

## 2021-08-04 NOTE — Lactation Note (Signed)
This note was copied from a baby's chart. ?Lactation Consultation Note ? ?Patient Name: Holly Greene ?Today's Date: 08/04/2021 ?Reason for consult: L&D Initial assessment;Term ?Age:27 hours ? ? ?Initial L&D Consult: ? ?Visited with family < 1 hour after birth ?RN informed me that mother had asked for formula.  Spoke with mother regarding her feeding preference and she is interested in "trying" to breast feed along with formula supplementation.  She was hesitant to breast feed and originally desired to wait until tomorrow.  Discussed the benefits of latching now and continuing to latch on the M/B unit.  Mother receptive to latch assistance after our discussion.  Baby latched easily and immediately began sucking effectively.  Observed him feeding for 5 minutes prior to leaving the room.  Follow Up on M/B.  Father present. ? ? ?Maternal Data ?  ? ?Feeding ?Mother's Current Feeding Choice: Breast Milk and Formula ? ?LATCH Score ?Latch: Grasps breast easily, tongue down, lips flanged, rhythmical sucking. ? ?Audible Swallowing: None ? ?Type of Nipple: Everted at rest and after stimulation ? ?Comfort (Breast/Nipple): Soft / non-tender ? ?Hold (Positioning): Assistance needed to correctly position infant at breast and maintain latch. ? ?LATCH Score: 7 ? ? ?Lactation Tools Discussed/Used ?  ? ?Interventions ?Interventions: Assisted with latch;Skin to skin ? ?Discharge ?  ? ?Consult Status ?Consult Status: Follow-up from L&D ? ? ? ?Holly Greene R [name] ?08/04/2021, 4:28 PM ? ? ? ?

## 2021-08-04 NOTE — Anesthesia Procedure Notes (Addendum)
Epidural ?Patient location during procedure: OB ?Start time: 08/04/2021 8:47 AM ?End time: 08/04/2021 8:52 AM ? ?Staffing ?Anesthesiologist: Bethena Midget, MD ? ?Preanesthetic Checklist ?Completed: patient identified, IV checked, site marked, risks and benefits discussed, surgical consent, monitors and equipment checked, pre-op evaluation and timeout performed ? ?Epidural ?Patient position: sitting ?Prep: DuraPrep and site prepped and draped ?Patient monitoring: continuous pulse ox and blood pressure ?Approach: midline ?Location: L3-L4 ?Injection technique: LOR air ? ?Needle:  ?Needle type: Tuohy  ?Needle gauge: 17 G ?Needle length: 9 cm and 9 ?Needle insertion depth: 7 cm ?Catheter type: closed end flexible ?Catheter size: 19 Gauge ?Catheter at skin depth: 12 cm ?Test dose: negative ? ?Assessment ?Events: blood not aspirated, injection not painful, no injection resistance, no paresthesia and negative IV test ? ? ? ?

## 2021-08-05 MED ORDER — ACETAMINOPHEN 325 MG PO TABS: 650.0000 mg | ORAL_TABLET | ORAL | 0 refills | Status: DC | PRN

## 2021-08-05 MED ORDER — IBUPROFEN 600 MG PO TABS: 600.0000 mg | ORAL_TABLET | Freq: Four times a day (QID) | ORAL | 0 refills | Status: DC

## 2021-08-05 NOTE — Clinical Social Work Maternal (Signed)
?  CLINICAL SOCIAL WORK MATERNAL/CHILD NOTE ? ?Patient Details  ?Name: Holly Greene ?MRN: [id number] ?Date of Birth: [birth year] ? ?Date:  08/05/2021 ? ?Clinical Social Worker Initiating Note:  Jimmy Picket Date/Time: Initiated:  08/05/21/1020    ? ?Child's Name:  Holly Greene  ? ?Biological Parents:  Mother, Father  ? ?Need for Interpreter:  None  ? ?Reason for Referral:  Other (Comment) (Limited PNC, HX of bipolar)  ? ?Address:  [address] ?[place] [address]  ?  ?Phone number:  [phone] (home)    ? ?Additional phone number:  ? ?Household Members/Support Persons (HM/SP):   Household Member/Support Person 1, Household Member/Support Person 2, Household Member/Support Person 3 ? ? ?HM/SP Name Relationship DOB or Age  ?HM/SP -1 Ricky Bennett   FOB  ?HM/SP -2 Edmon Crape daughter 5  ?HM/SP -3 [place] son 4  ?HM/SP -4        ?HM/SP -5        ?HM/SP -6        ?HM/SP -7        ?HM/SP -8        ? ? ?Natural Supports (not living in the home):  Extended Family, Immediate Family, Parent  ? ?Professional Supports:    ? ?Employment: Full-time  ? ?Type of Work: Scientific laboratory technician  ? ?Education:  High school graduate  ? ?Homebound arranged:   ? ?Financial Resources:  Medicaid  ? ?Other Resources:  WIC, Food Stamps    ? ?Cultural/Religious Considerations Which May Impact Care:   ? ?Strengths:  Ability to meet basic needs  , Home prepared for child  , Pediatrician chosen  ? ?Psychotropic Medications:        ? ?Pediatrician:    [place] area ? ?Pediatrician List:  ? ?[facility]  ?[place]    ?[facility]    ?[facility]    ?[facility]    ?[facility]    ? ? ?Pediatrician Fax Number:   ? ?Risk Factors/Current Problems:     ? ?Cognitive State:  Goal Oriented  , Alert    ? ?Mood/Affect:  Happy    ? ?CSW Assessment: CSW received consult for limited prenatal care and history of bipolar. [name] reports she did not have transportation to PN appointments, and also she was working and unable to  take time off for appointments. [name] reports that she now has a care and will be able to make it to follow up appointments [name] reports that in her late teens, she was diagnoses with bipolar. [name] reports that has not been an issue since before she had her first child. [name] feels like she should no longer have that diagnosis. [name] does not take any bipolar meds. [name] reports having  good support system. [name] has bassinet and car seat for baby. .   ? ?CSW Plan/Description:  No Further Intervention Required/No Barriers to Discharge, Sudden Infant Death Syndrome (SIDS) Education, Perinatal Mood and Anxiety Disorder (PMADs) Education  ? ? ?Jimmy Picket, LCSW ?08/05/2021, 10:27 AM ? ?

## 2021-08-05 NOTE — Progress Notes (Addendum)
Post Partum Day 1 : VBAC ?Subjective: ?Patient right lateral, no complaints and tolerating PO. She desires discharge home today. ? ?Objective: ?Blood pressure 121/77, pulse 82, temperature 98.5 ?F (36.9 ?C), temperature source Oral, resp. rate 18, last menstrual period 10/31/2020, SpO2 100 %, unknown if currently breastfeeding. ? ?Physical Exam:  ?General: alert and cooperative ?Lochia: appropriate ?Uterine Fundus: firm ?Incision: N/A ?DVT Evaluation: No evidence of DVT seen on physical exam. ? ?Recent Labs  ?  08/04/21 ?0758  ?HGB 10.0*  ?HCT 30.1*  ? ? ?Assessment/Plan: ?Discharge home, Social Work consult, and Circumcision prior to discharge ?Educated patient on signs of pre-eclampsa due to history in previous pregnancy. ? ? LOS: 1 day  ? ?Salley Slaughter Desir, SNM ?08/05/2021, 7:54 AM  ? ?Midwife attestation ?Post Partum Day 1 ?I have seen and examined this patient and agree with above documentation in the midwife student's note.  ? ?Lella Mullany is a 26 y.o. [phone] s/p successful VBAC.  Pt denies problems with ambulating, voiding or po intake. Pain is well controlled.  Plan for birth control is condoms.  Method of Feeding: breast ? ?PE:  ?BP 121/77 (BP Location: Right Arm)   Pulse 82   Temp 98.5 ?F (36.9 ?C) (Oral)   Resp 18   LMP 10/31/2020   SpO2 100%   Breastfeeding Unknown  ?Gen: well appearing ?Heart: reg rate ?Lungs: normal WOB ?Fundus firm ?Ext: soft, no pain, no edema ? ?Plan for discharge: today, 08/05/21 after 3 pm pending infant discharge ? ?Sharen Counter, CNM ?9:05 AM  ? ? ?

## 2021-08-05 NOTE — Lactation Note (Signed)
This note was copied from a baby's chart. ?Lactation Consultation Note ? ?Patient Name: Holly Greene ?Today's Date: 08/05/2021 ?Reason for consult: Initial assessment;Term ?Age:27 hours ? ? ?P3 mother whose infant is now 30 hours old.  This is a term baby at 39+4 weeks.  Mother's current feeding preference is breast/formula. ? ?Baby has been primarily formula feeding.  He was showing cues when I arrived.  Offered to assist with latching and mother politely declined.  She will latch and call for assistance if needed.  Mother denies difficulty with latching.  She feels like he is not "getting enough" from breast feeding and desires to supplement after every feeding.  Volume guidelines discussed.  Baby has multiple voids/stools. ? ?Mother desires a discharge today.  No support person present at this time. ? ? ?Maternal Data ?Has patient been taught Hand Expression?: Yes ? ?Feeding ?Mother's Current Feeding Choice: Breast Milk and Formula ?Nipple Type: Slow - flow ? ?LATCH Score ?  ? ?  ? ?  ? ?  ? ?  ? ?  ? ? ?Lactation Tools Discussed/Used ?  ? ?Interventions ?Interventions: Breast feeding basics reviewed;Education;LC Services brochure ? ?Discharge ?  ? ?Consult Status ?Consult Status: Follow-up ?Date: 08/06/21 ?Follow-up type: In-patient ? ? ? ?Celinda Dethlefs R Sammie Denner ?08/05/2021, 8:57 AM ? ? ? ?

## 2021-08-12 ENCOUNTER — Telehealth (HOSPITAL_COMMUNITY): Payer: Self-pay | Admitting: *Deleted

## 2021-08-12 NOTE — Telephone Encounter (Signed)
Attempted hospital discharge follow-up call. Left message for patient to return RN call. Deforest Hoyles, RN, 08/12/21, [phone] ?

## 2021-09-18 ENCOUNTER — Telehealth (INDEPENDENT_AMBULATORY_CARE_PROVIDER_SITE_OTHER): Payer: Medicaid Other | Admitting: Advanced Practice Midwife

## 2021-09-18 DIAGNOSIS — Z1389 Encounter for screening for other disorder: Secondary | ICD-10-CM

## 2021-09-18 NOTE — Progress Notes (Signed)
Postpartum VIRTUAL VISIT ENCOUNTER NOTE  Provider location: Center for [facility]   Patient location: Home  I connected with Holly Greene on 09/18/21 at 10:55 AM EDT by MyChart Video Encounter and verified that I am speaking with the correct person using two identifiers.   I discussed the limitations, risks, security and privacy concerns of performing an evaluation and management service virtually and the availability of in person appointments. I also discussed with the patient that there may be a patient responsible charge related to this service. The patient expressed understanding and agreed to proceed.     Post Partum Visit Note  Holly Greene is a 26 y.o. [phone] female who presents for a postpartum visit. She is 6 weeks postpartum following a normal spontaneous vaginal delivery.  I have fully reviewed the prenatal and intrapartum course. The delivery was at 39.4 gestational weeks.  Anesthesia: epidural. Postpartum course has been uncomplicated. Baby is doing well. Baby is feeding by bottle - Gerber Gentle start . Bleeding no bleeding. Bowel function is normal. Bladder function is normal. Patient is sexually active. Contraception method is condoms. Postpartum depression screening: negative.   The pregnancy intention screening data noted above was reviewed. Potential methods of contraception were discussed. The patient elected to proceed with No data recorded.   Edinburgh Postnatal Depression Scale - 09/18/21 1117       Edinburgh Postnatal Depression Scale:  In the Past 7 Days   I have been able to laugh and see the funny side of things. 0    I have looked forward with enjoyment to things. 0    I have blamed myself unnecessarily when things went wrong. 0    I have been anxious or worried for no good reason. 0    I have felt scared or panicky for no good reason. 0    Things have been getting on top of me. 1    I have been so unhappy that I have had  difficulty sleeping. 0    I have felt sad or miserable. 0    I have been so unhappy that I have been crying. 0    The thought of harming myself has occurred to me. 0    Edinburgh Postnatal Depression Scale Total 1             Health Maintenance Due  Topic Date Due   COVID-19 Vaccine (1) Never done   HPV VACCINES (1 - 2-dose series) Never done    The following portions of the patient's history were reviewed and updated as appropriate: allergies, current medications, past family history, past medical history, past social history, past surgical history, and problem list. Review of Systems:  Pertinent items noted in HPI and remainder of comprehensive ROS otherwise negative.  Physical Exam:   General:  Alert, oriented and cooperative. Patient appears to be in no acute distress.  Mental Status: Normal mood and affect. Normal behavior. Normal judgment and thought content.   Respiratory: Normal respiratory effort, no problems with respiration noted  Rest of physical exam deferred due to type of encounter   Assessment:    1. Postpartum care following vaginal delivery --Doing well, bonding well with baby, good support at home. Bottle feeding, no breast problems.    Plan:   Essential components of care per ACOG recommendations:  1.  Mood and well being: Patient with negative depression screening today. Reviewed local resources for support.  - Patient tobacco use? No.   -  hx of drug use? No.    2. Infant care and feeding:  -Patient currently breastmilk feeding? No.  -Social determinants of health (SDOH) reviewed in EPIC. No concerns    3. Sexuality, contraception and birth spacing - Patient does not want a pregnancy in the next year.   --Discussed pt contraceptive plans and reviewed contraceptive methods based on pt preferences and effectiveness.  Pt prefers to use condoms.  - Discussed birth spacing of 18 months  4. Sleep and fatigue -Encouraged family/partner/community  support of 4 hrs of uninterrupted sleep to help with mood and fatigue  5. Physical Recovery  - Discussed patients delivery and complications. She describes her labor as good. - Patient had a  successful VBAC . Patient had small superficial lacerations without need for repair. Perineal healing reviewed. Patient expressed understanding - Patient has urinary incontinence? No. - Patient is safe to resume physical and sexual activity  6.  Health Maintenance - HM due items addressed Yes - Last pap smear  Diagnosis  Date Value Ref Range Status  03/20/2021   Final   - Negative for intraepithelial lesion or malignancy (NILM)   Pap smear not done at today's visit.  -Breast Cancer screening indicated? No.   7. Chronic Disease/Pregnancy Condition follow up: None  - PCP follow up     I discussed the assessment and treatment plan with the patient. The patient was provided an opportunity to ask questions and all were answered. The patient agreed with the plan and demonstrated an understanding of the instructions.   The patient was advised to call back or seek an in-person evaluation/go to the ED if the symptoms worsen or if the condition fails to improve as anticipated.  I provided 10 minutes of face-to-face time during this encounter.  Sharen Counter, CNM Center for [employer]

## 2022-02-13 ENCOUNTER — Ambulatory Visit: Payer: Medicaid Other

## 2022-02-24 ENCOUNTER — Inpatient Hospital Stay (HOSPITAL_COMMUNITY)
Admission: AD | Admit: 2022-02-24 | Discharge: 2022-02-24 | Disposition: A | Discharge status: Home / Self Care | Payer: Medicaid Other | Attending: Obstetrics & Gynecology | Admitting: Obstetrics & Gynecology

## 2022-02-24 ENCOUNTER — Encounter (HOSPITAL_COMMUNITY): Payer: Self-pay | Admitting: *Deleted

## 2022-02-24 ENCOUNTER — Inpatient Hospital Stay (HOSPITAL_BASED_OUTPATIENT_CLINIC_OR_DEPARTMENT_OTHER): Payer: Medicaid Other

## 2022-02-24 DIAGNOSIS — Z3A16 16 weeks gestation of pregnancy: Secondary | ICD-10-CM | POA: Diagnosis not present

## 2022-02-24 DIAGNOSIS — B9689 Other specified bacterial agents as the cause of diseases classified elsewhere: Secondary | ICD-10-CM

## 2022-02-24 DIAGNOSIS — O4402 Placenta previa specified as without hemorrhage, second trimester: Secondary | ICD-10-CM | POA: Diagnosis not present

## 2022-02-24 DIAGNOSIS — O4692 Antepartum hemorrhage, unspecified, second trimester: Secondary | ICD-10-CM

## 2022-02-24 DIAGNOSIS — O26892 Other specified pregnancy related conditions, second trimester: Secondary | ICD-10-CM | POA: Insufficient documentation

## 2022-02-24 DIAGNOSIS — O23592 Infection of other part of genital tract in pregnancy, second trimester: Secondary | ICD-10-CM | POA: Diagnosis not present

## 2022-02-24 DIAGNOSIS — O4590 Premature separation of placenta, unspecified, unspecified trimester: Secondary | ICD-10-CM | POA: Insufficient documentation

## 2022-02-24 LAB — WET PREP, GENITAL
Sperm: NONE SEEN
Trich, Wet Prep: NONE SEEN
WBC, Wet Prep HPF POC: <10 (ref <10)
Yeast Wet Prep HPF POC: NONE SEEN

## 2022-02-24 LAB — CBC
HCT: 30.7 % — ABNORMAL LOW (ref 36.0–46.0)
Hemoglobin: 10.8 g/dL — ABNORMAL LOW (ref 12.0–15.0)
MCH: 32.7 pg (ref 26.0–34.0)
MCHC: 35.2 g/dL (ref 30.0–36.0)
MCV: 93 fL (ref 80.0–100.0)
Platelets: 195 10*3/uL (ref 150–400)
RBC: 3.3 MIL/uL — ABNORMAL LOW (ref 3.87–5.11)
RDW: 12.8 % (ref 11.5–15.5)
WBC: 10.7 10*3/uL — ABNORMAL HIGH (ref 4.0–10.5)
nRBC: 0 % (ref 0.0–0.2)

## 2022-02-24 LAB — TYPE AND SCREEN
ABO/RH(D): O POS
Antibody Screen: NEGATIVE

## 2022-02-24 MED ORDER — ASPIRIN 81 MG PO TBEC
81.0000 mg | DELAYED_RELEASE_TABLET | Freq: Every day | ORAL | 12 refills | Status: DC
Start: 2022-02-24 — End: 2022-05-26

## 2022-02-24 MED ORDER — METRONIDAZOLE 500 MG PO TABS
500.0000 mg | ORAL_TABLET | Freq: Two times a day (BID) | ORAL | 0 refills | Status: DC
Start: 2022-02-24 — End: 2022-05-14

## 2022-02-24 MED ORDER — ACETAMINOPHEN 500 MG PO TABS
1000.0000 mg | ORAL_TABLET | Freq: Once | ORAL | Status: AC
  Administered 2022-02-24: 1000 mg via ORAL
  Filled 2022-02-24: qty 2

## 2022-02-24 NOTE — MAU Note (Signed)
Holly Greene is a 26 y.o. at Unknown here in MAU reporting: came in by EMS, bleeding at 12 wks, no care. last night she had bleeding, was really heavy, blood on the tissue, toilet was full of blood, noted it was on her couch and covers. Wasn't really cramping last night.This morning when she woke up, she was still bleeding but not as heavy, has not changed her pad today.cramping started this morning. Feeling pressure.  Wasn't planning on keeping the preg.  Didn't know how far she was, when she went to the abortion clinic, they told her she was too far along. They did an [place] LMP: 7/1 Onset of complaint: last night Pain score: 8 Vitals:   02/24/22 1500  BP: 130/70  Pulse: 78  Resp: 18  Temp: 98.1 F (36.7 C)  SpO2: 100%     FHT:162 Lab orders placed from triage:

## 2022-02-24 NOTE — MAU Provider Note (Signed)
History     CSN: [id number]  Arrival date and time: 02/24/22 1443   Event Date/Time   First Provider Initiated Contact with Patient 02/24/22 1807      Chief Complaint  Patient presents with   Vaginal Bleeding   Abdominal Pain   HPI Holly Greene is a 26 y.o. G5P3013 at [redacted]w[redacted]d by early ultrasound. She presents to MAU via EMS with chief complaint of heavy vaginal bleeding last night, with lighter but persistent bleeding throughout the day today.  Patient also c/o lower abdominal cramping, onset this morning. Pain score is 8/10. She has not taken medication for this complaint. She denies aggravating or alleviating factors.  Patient has not received prenatal care but had an early ultrasound which identified her due date as 08/08/22  OB History     Gravida  5   Para  3   Term  3   Preterm      AB  1   Living  3      SAB      IAB  1   Ectopic      Multiple  0   Live Births  3           Past Medical History:  Diagnosis Date   Asthma    as a child   Bipolar 1 disorder (HCC)    Defiant behavior    Hypertension    Knee dislocation     Past Surgical History:  Procedure Laterality Date   CESAREAN SECTION N/A 06/29/2017   Procedure: CESAREAN SECTION;  Surgeon: Catalina Antigua, MD;  Location: [facility];  Service: Obstetrics;  Laterality: N/A;   TONSILLECTOMY      Family History  Problem Relation Age of Onset   Diabetes Mother    Hypertension Mother    Diabetes Maternal Grandmother    Hypertension Maternal Grandmother     Social History   Tobacco Use   Smoking status: Never   Smokeless tobacco: Never  Vaping Use   Vaping Use: Never used  Substance Use Topics   Alcohol use: No    Comment: not while preg   Drug use: Not Currently    Types: Marijuana    Comment: Stopped once found out was pregnant    Allergies: No Known Allergies  Medications Prior to Admission  Medication Sig Dispense Refill Last Dose   acetaminophen (TYLENOL)  325 MG tablet Take 2 tablets (650 mg total) by mouth every 4 (four) hours as needed (for pain scale < 4). (Patient not taking: Reported on 09/18/2021) 30 tablet 0    ibuprofen (ADVIL) 600 MG tablet Take 1 tablet (600 mg total) by mouth every 6 (six) hours. (Patient not taking: Reported on 09/18/2021) 30 tablet 0    prenatal vitamin w/FE, FA (PRENATAL 1 + 1) 27-1 MG TABS tablet Take 1 tablet by mouth daily at 12 noon.       Review of Systems  Gastrointestinal:  Positive for abdominal pain.  Genitourinary:  Positive for vaginal bleeding.  All other systems reviewed and are negative.  Physical Exam   Blood pressure 130/70, pulse 78, temperature 98.1 F (36.7 C), temperature source Oral, resp. rate 18, height 5\' 7"  (1.702 m), weight 95.2 kg, last menstrual period 10/07/2021, SpO2 100 %, unknown if currently breastfeeding.  Physical Exam Vitals and nursing note reviewed. Exam conducted with a chaperone present.  Constitutional:      Appearance: She is well-developed.  Cardiovascular:     Rate and Rhythm: Normal  rate.     Heart sounds: Normal heart sounds.  Pulmonary:     Effort: Pulmonary effort is normal.     Breath sounds: Normal breath sounds.  Abdominal:     Palpations: Abdomen is soft.     Tenderness: There is no abdominal tenderness.  Genitourinary:    Comments: Pelvic exam: External genitalia normal, vaginal walls pink and well rugated, cervix visually closed, no lesions noted. Thin white discharge at posterior fornix. No bleeding or evidence of recent bleeding event.   Skin:    Capillary Refill: Capillary refill takes less than 2 seconds.  Neurological:     Mental Status: She is alert and oriented to person, place, and time.  Psychiatric:        Mood and Affect: Mood normal.        Behavior: Behavior normal.     MAU Course  Procedures: speculum exam, MFM ultrasound  MDM Orders Placed This Encounter  Procedures   Wet prep, genital   [place] MFM OB Limited   CBC   Type  and screen [place]   Discharge patient   Patient Vitals for the past 24 hrs:  BP Temp Temp src Pulse Resp SpO2 Height Weight  02/24/22 1837 -- -- -- -- 18 -- -- --  02/24/22 1500 130/70 98.1 F (36.7 C) Oral 78 18 100 % 5\' 7"  (1.702 m) 95.2 kg   Results for orders placed or performed during the hospital encounter of 02/24/22 (from the past 24 hour(s))  Type and screen [place]     Status: None   Collection Time: 02/24/22  3:25 PM  Result Value Ref Range   ABO/RH(D) O POS    Antibody Screen NEG    Sample Expiration      02/27/2022,2359 Performed at [facility], [place] [address] [place], [place] [address]   CBC     Status: Abnormal   Collection Time: 02/24/22  3:27 PM  Result Value Ref Range   WBC 10.7 (H) 4.0 - 10.5 K/uL   RBC 3.30 (L) 3.87 - 5.11 MIL/uL   Hemoglobin 10.8 (L) 12.0 - 15.0 g/dL   HCT 30.7 (L) 36.0 - 46.0 %   MCV 93.0 80.0 - 100.0 fL   MCH 32.7 26.0 - 34.0 pg   MCHC 35.2 30.0 - 36.0 g/dL   RDW 12.8 11.5 - 15.5 %   Platelets 195 150 - 400 K/uL   nRBC 0.0 0.0 - 0.2 %  Wet prep, genital     Status: Abnormal   Collection Time: 02/24/22  3:30 PM   Specimen: Vaginal  Result Value Ref Range   Yeast Wet Prep HPF POC NONE SEEN NONE SEEN   Trich, Wet Prep NONE SEEN NONE SEEN   Clue Cells Wet Prep HPF POC PRESENT (A) NONE SEEN   WBC, Wet Prep HPF POC <10 <10   Sperm NONE SEEN    [place] MFM OB Limited  Result Date: 02/24/2022 ----------------------------------------------------------------------  OBSTETRICS REPORT                       (Signed Final 02/24/2022 09:06 pm) ---------------------------------------------------------------------- Patient Info  ID #:       [id number]                          D.O.B.:  July 14, 1995 (25 yrs)  Name:       Holly Greene  Visit Date: 02/24/2022 05:55 pm ---------------------------------------------------------------------- Performed By  Attending:        Tama High MD         Secondary Phy.:    Darlina Rumpf  Performed By:     Georgie Chard        [facility]                    [place] [facility]  Referred By:      [facility] MAU/Triage ---------------------------------------------------------------------- Orders  #  Description                           Code        Ordered By  1  [place] MFM OB LIMITED                     [id number]    Mallie Snooks ----------------------------------------------------------------------  #  Order #                     Accession #                Episode #  1  [id number]                   [id number]                 [id number] ---------------------------------------------------------------------- Indications  [redacted] weeks gestation of pregnancy                 Z3A.16  Vaginal bleeding in pregnancy, second           O46.92  trimester ---------------------------------------------------------------------- Fetal Evaluation  Num Of Fetuses:          1  Fetal Heart Rate(bpm):   144  Cardiac Activity:        Observed  Presentation:            Transverse, head to maternal right  Placenta:                Posterior Previa  P. Cord Insertion:       Not well visualized  Amniotic Fluid  AFI FV:      Within normal limits                              Largest Pocket(cm)  6.2 ---------------------------------------------------------------------- Biometry  LV:        5.1  mm ---------------------------------------------------------------------- Gestational Age  LMP:           20w 0d        Date:  10/07/21                   EDD:   07/14/22  Clinical EDD:  16w 3d                                        EDD:   08/08/22  Best:          16w 3d     Det. By:  Clinical EDD             EDD:   08/08/22 ---------------------------------------------------------------------- Anatomy  Ventricles:             Appears normal         Abdomen:                Appears normal  Thoracic:              Appears normal         Kidneys:                Appear normal  Diaphragm:             Appears normal         Bladder:                Appears normal  Stomach:               Appears normal, left                         sided ---------------------------------------------------------------------- Cervix Uterus Adnexa  Cervix  Length:            3.7  cm.  Normal appearance by transabdominal scan.  Uterus  No abnormality visualized.  Right Ovary  Not visualized.  Left Ovary  Not visualized.  Cul De Sac  No free fluid seen.  Adnexa  No abnormality visualized. ---------------------------------------------------------------------- Impression  Patient was evaluated for c/o vaginal bleeding.  A limited ultrasound study was performed. Amniotic fluid is  normal and good fetal activity is seen. On transabdominal  scan, the cervix is long and closed. Placenta previa is seen. ----------------------------------------------------------------------                  Tama High, MD Electronically Signed Final Report   02/24/2022 09:06 pm ----------------------------------------------------------------------   Meds ordered this encounter  Medications   acetaminophen (TYLENOL) tablet 1,000 mg   metroNIDAZOLE (FLAGYL) 500 MG tablet    Sig: Take 1 tablet (500 mg total) by mouth 2 (two) times daily.    Dispense:  14 tablet    Refill:  0    Order Specific Question:   Supervising Provider    Answer:   Verita Schneiders A [[id number]]   aspirin EC 81 MG tablet    Sig: Take 1 tablet (81 mg total) by mouth daily. Take daily until delivery for preeclampsia prevention    Dispense:  30 tablet    Refill:  12    Order Specific Question:   Supervising Provider    Answer:   Verita Schneiders A [id number]   Assessment and Plan  --26 y.o. [id number] at [redacted]w[redacted]d  by early ultrasound --Posterior previa, s/p bleeding episode last night, now stable --Hgb 10.8, Rh  POS --Bacterial Vaginosis --Discharge home in stable condition with pelvic rest precautions  F/U: --Patient to contact [place] to schedule prenatal care  Darlina Rumpf, [place], MSN, CNM

## 2022-02-24 NOTE — Discharge Instructions (Signed)

## 2022-02-26 LAB — GC/CHLAMYDIA PROBE AMP (~~LOC~~) NOT AT ARMC
Chlamydia: NEGATIVE
Comment: NEGATIVE
Comment: NORMAL
Neisseria Gonorrhea: NEGATIVE

## 2022-03-28 ENCOUNTER — Ambulatory Visit (INDEPENDENT_AMBULATORY_CARE_PROVIDER_SITE_OTHER): Payer: Medicaid Other

## 2022-03-28 DIAGNOSIS — O4402 Placenta previa specified as without hemorrhage, second trimester: Secondary | ICD-10-CM

## 2022-03-28 DIAGNOSIS — Z3480 Encounter for supervision of other normal pregnancy, unspecified trimester: Secondary | ICD-10-CM | POA: Insufficient documentation

## 2022-03-28 MED ORDER — GOJJI WEIGHT SCALE MISC: 1.0000 | 0 refills | Status: DC

## 2022-03-28 MED ORDER — BLOOD PRESSURE KIT DEVI: 1.0000 | 0 refills | Status: DC

## 2022-03-28 NOTE — Progress Notes (Signed)
New OB Intake  I connected with Holly Greene on 03/28/22 at  2:10 PM EST by telephone Video Visit and verified that I am speaking with the correct person using two identifiers. Nurse is located at [facility] and pt is located at Home.  I discussed the limitations, risks, security and privacy concerns of performing an evaluation and management service by telephone and the availability of in person appointments. I also discussed with the patient that there may be a patient responsible charge related to this service. The patient expressed understanding and agreed to proceed.  I explained I am completing New OB Intake today. We discussed EDD of 08/08/22 that is based on 2nd trimester u/s. Pt is G5/P3013. I reviewed her allergies, medications, Medical/Surgical/OB history, and appropriate screenings. I informed her of [facility] services. [facility] information placed in AVS. Based on history, this is a high risk pregnancy.  Patient Active Problem List   Diagnosis Date Noted   Placenta previa antepartum in second trimester 02/24/2022   VBAC (vaginal birth after Cesarean) 08/04/2021   Eczema 04/27/2021   History of cesarean section 06/29/2017   Bipolar disorder (HCC) 03/06/2017   Asthma 03/06/2017   Hx of preeclampsia, prior pregnancy, currently pregnant 03/06/2017    Concerns addressed today  Delivery Plans Plans to deliver at [facility]. Patient given information for [facility] website for more information about [facility]. Patient is not interested in water birth. Offered upcoming OB visit with CNM to discuss further.  MyChart/Babyscripts MyChart access verified. I explained pt will have some visits in office and some virtually. Babyscripts instructions given and order placed. Patient verifies receipt of registration text/e-mail. Account successfully created and app downloaded.  Blood Pressure Cuff/Weight Scale Blood pressure cuff ordered for patient to pick-up from [employer]. Explained after first prenatal appt pt will check weekly and document in Babyscripts. Patient does not have weight scale; order sent to [facility], patient may track weight weekly in Babyscripts.  Anatomy US Explained first scheduled [place] will be around 19 weeks. Anatomy US ordered. Date TBD.   Labs Discussed Avelina Laine genetic screening with patient. Would like both Panorama and Horizon drawn at new OB visit. Routine prenatal labs needed.  COVID Vaccine Patient has not had COVID vaccine.   Is patient a CenteringPregnancy candidate?  Not a Candidate Declined due to  Not a candidate due to Complex coordination of care needed If accepted,    Social Determinants of Health Food Insecurity: Patient denies food insecurity. WIC Referral: Patient is interested in referral to [facility].  Transportation: Patient expressed transportation needs. Childcare: Discussed no children allowed at ultrasound appointments. Offered childcare services; patient declines childcare services at this time.  First visit review I reviewed new OB appt with patient. I explained they will have a provider visit that includes prenatal labs, pap smear, std screening, genetic screening, and discuss plan of care for pregnancy. Explained pt will be seen by Mariel Aloe at first visit; encounter routed to appropriate provider. Explained that patient will be seen by pregnancy navigator following visit with provider.   Hamilton Capri, RN 03/28/2022  2:39 PM

## 2022-04-17 ENCOUNTER — Encounter: Payer: Medicaid Other | Admitting: Obstetrics and Gynecology

## 2022-04-24 ENCOUNTER — Encounter: Payer: Self-pay | Admitting: *Deleted

## 2022-04-24 ENCOUNTER — Other Ambulatory Visit: Payer: Self-pay | Admitting: *Deleted

## 2022-04-24 ENCOUNTER — Ambulatory Visit: Payer: Medicaid Other | Attending: Obstetrics & Gynecology

## 2022-04-24 ENCOUNTER — Ambulatory Visit: Payer: Medicaid Other | Admitting: *Deleted

## 2022-04-24 VITALS — BP 133/69 | HR 79

## 2022-04-24 DIAGNOSIS — O34219 Maternal care for unspecified type scar from previous cesarean delivery: Secondary | ICD-10-CM

## 2022-04-24 DIAGNOSIS — E669 Obesity, unspecified: Secondary | ICD-10-CM

## 2022-04-24 DIAGNOSIS — O4403 Placenta previa specified as without hemorrhage, third trimester: Secondary | ICD-10-CM | POA: Insufficient documentation

## 2022-04-24 DIAGNOSIS — Z3480 Encounter for supervision of other normal pregnancy, unspecified trimester: Secondary | ICD-10-CM | POA: Diagnosis present

## 2022-04-24 DIAGNOSIS — Z3A28 28 weeks gestation of pregnancy: Secondary | ICD-10-CM | POA: Insufficient documentation

## 2022-04-24 DIAGNOSIS — O09293 Supervision of pregnancy with other poor reproductive or obstetric history, third trimester: Secondary | ICD-10-CM

## 2022-04-24 DIAGNOSIS — Z363 Encounter for antenatal screening for malformations: Secondary | ICD-10-CM

## 2022-04-24 DIAGNOSIS — O09893 Supervision of other high risk pregnancies, third trimester: Secondary | ICD-10-CM

## 2022-04-24 DIAGNOSIS — O99213 Obesity complicating pregnancy, third trimester: Secondary | ICD-10-CM | POA: Diagnosis not present

## 2022-04-24 DIAGNOSIS — O4402 Placenta previa specified as without hemorrhage, second trimester: Secondary | ICD-10-CM | POA: Diagnosis present

## 2022-05-08 ENCOUNTER — Encounter (HOSPITAL_COMMUNITY): Payer: Self-pay | Admitting: Obstetrics and Gynecology

## 2022-05-08 ENCOUNTER — Inpatient Hospital Stay (HOSPITAL_COMMUNITY): Payer: Medicaid Other

## 2022-05-08 ENCOUNTER — Inpatient Hospital Stay (HOSPITAL_COMMUNITY)
Admission: AD | Admit: 2022-05-08 | Discharge: 2022-05-14 | DRG: 831 | Discharge status: Against Medical Advice | Payer: Medicaid Other | Attending: Obstetrics & Gynecology | Admitting: Obstetrics & Gynecology

## 2022-05-08 ENCOUNTER — Other Ambulatory Visit: Payer: Self-pay

## 2022-05-08 DIAGNOSIS — O2303 Infections of kidney in pregnancy, third trimester: Secondary | ICD-10-CM | POA: Diagnosis present

## 2022-05-08 DIAGNOSIS — O34219 Maternal care for unspecified type scar from previous cesarean delivery: Secondary | ICD-10-CM | POA: Diagnosis present

## 2022-05-08 DIAGNOSIS — A419 Sepsis, unspecified organism: Secondary | ICD-10-CM | POA: Diagnosis present

## 2022-05-08 DIAGNOSIS — O0930 Supervision of pregnancy with insufficient antenatal care, unspecified trimester: Secondary | ICD-10-CM

## 2022-05-08 DIAGNOSIS — Z5329 Procedure and treatment not carried out because of patient's decision for other reasons: Secondary | ICD-10-CM | POA: Diagnosis present

## 2022-05-08 DIAGNOSIS — B9620 Unspecified Escherichia coli [E. coli] as the cause of diseases classified elsewhere: Secondary | ICD-10-CM | POA: Diagnosis present

## 2022-05-08 DIAGNOSIS — O4693 Antepartum hemorrhage, unspecified, third trimester: Secondary | ICD-10-CM | POA: Diagnosis present

## 2022-05-08 DIAGNOSIS — O0933 Supervision of pregnancy with insufficient antenatal care, third trimester: Secondary | ICD-10-CM | POA: Diagnosis not present

## 2022-05-08 DIAGNOSIS — E876 Hypokalemia: Secondary | ICD-10-CM | POA: Diagnosis present

## 2022-05-08 DIAGNOSIS — O99283 Endocrine, nutritional and metabolic diseases complicating pregnancy, third trimester: Secondary | ICD-10-CM | POA: Diagnosis present

## 2022-05-08 DIAGNOSIS — O2300 Infections of kidney in pregnancy, unspecified trimester: Secondary | ICD-10-CM | POA: Diagnosis present

## 2022-05-08 DIAGNOSIS — R109 Unspecified abdominal pain: Secondary | ICD-10-CM | POA: Diagnosis present

## 2022-05-08 DIAGNOSIS — Z3A30 30 weeks gestation of pregnancy: Secondary | ICD-10-CM

## 2022-05-08 DIAGNOSIS — N10 Acute tubulo-interstitial nephritis: Secondary | ICD-10-CM | POA: Diagnosis present

## 2022-05-08 DIAGNOSIS — Z3A31 31 weeks gestation of pregnancy: Secondary | ICD-10-CM | POA: Diagnosis not present

## 2022-05-08 DIAGNOSIS — O4593 Premature separation of placenta, unspecified, third trimester: Secondary | ICD-10-CM | POA: Diagnosis not present

## 2022-05-08 LAB — URINALYSIS, ROUTINE W REFLEX MICROSCOPIC
Bilirubin Urine: NEGATIVE
Glucose, UA: NEGATIVE mg/dL
Hgb urine dipstick: NEGATIVE
Ketones, ur: 80 mg/dL — AB
Nitrite: NEGATIVE
Protein, ur: 100 mg/dL — AB
Specific Gravity, Urine: 1.014 (ref 1.005–1.030)
WBC, UA: >50 WBC/hpf (ref 0–5)
pH: 6 (ref 5.0–8.0)

## 2022-05-08 LAB — COMPREHENSIVE METABOLIC PANEL
ALT: 8 U/L (ref 0–44)
AST: 34 U/L (ref 15–41)
Albumin: 2.5 g/dL — ABNORMAL LOW (ref 3.5–5.0)
Alkaline Phosphatase: 86 U/L (ref 38–126)
Anion gap: 12 (ref 5–15)
BUN: <5 mg/dL — ABNORMAL LOW (ref 6–20)
CO2: 19 mmol/L — ABNORMAL LOW (ref 22–32)
Calcium: 8 mg/dL — ABNORMAL LOW (ref 8.9–10.3)
Chloride: 100 mmol/L (ref 98–111)
Creatinine, Ser: 0.77 mg/dL (ref 0.44–1.00)
GFR, Estimated: >60 mL/min (ref >60)
Glucose, Bld: 109 mg/dL — ABNORMAL HIGH (ref 70–99)
Potassium: 2.6 mmol/L — CL (ref 3.5–5.1)
Sodium: 131 mmol/L — ABNORMAL LOW (ref 135–145)
Total Bilirubin: 1.2 mg/dL (ref 0.3–1.2)
Total Protein: 5.7 g/dL — ABNORMAL LOW (ref 6.5–8.1)

## 2022-05-08 LAB — CBC
HCT: 28.3 % — ABNORMAL LOW (ref 36.0–46.0)
Hemoglobin: 9.7 g/dL — ABNORMAL LOW (ref 12.0–15.0)
MCH: 31.4 pg (ref 26.0–34.0)
MCHC: 34.3 g/dL (ref 30.0–36.0)
MCV: 91.6 fL (ref 80.0–100.0)
Platelets: 161 10*3/uL (ref 150–400)
RBC: 3.09 MIL/uL — ABNORMAL LOW (ref 3.87–5.11)
RDW: 12 % (ref 11.5–15.5)
WBC: 10.5 10*3/uL (ref 4.0–10.5)
nRBC: 0 % (ref 0.0–0.2)

## 2022-05-08 LAB — LACTIC ACID, PLASMA: Lactic Acid, Venous: 1.1 mmol/L (ref 0.5–1.9)

## 2022-05-08 LAB — MAGNESIUM: Magnesium: 1.5 mg/dL — ABNORMAL LOW (ref 1.7–2.4)

## 2022-05-08 MED ORDER — SODIUM CHLORIDE 0.9 % IV BOLUS
1000.0000 mL | Freq: Once | INTRAVENOUS | Status: AC
  Administered 2022-05-08: 1000 mL via INTRAVENOUS

## 2022-05-08 MED ORDER — ACETAMINOPHEN 325 MG PO TABS: 650.0000 mg | ORAL_TABLET | ORAL | Status: DC | PRN

## 2022-05-08 MED ORDER — POTASSIUM CHLORIDE 10 MEQ/50ML IV SOLN: 10.0000 meq | INTRAVENOUS | Status: DC

## 2022-05-08 MED ORDER — DOCUSATE SODIUM 100 MG PO CAPS
100.0000 mg | ORAL_CAPSULE | Freq: Every day | ORAL | Status: DC
  Administered 2022-05-09 – 2022-05-13 (×2): 100 mg via ORAL
  Filled 2022-05-08 (×4): qty 1

## 2022-05-08 MED ORDER — POTASSIUM CHLORIDE 10 MEQ/100ML IV SOLN
10.0000 meq | INTRAVENOUS | Status: AC
  Administered 2022-05-08 (×3): 10 meq via INTRAVENOUS
  Filled 2022-05-08 (×3): qty 100

## 2022-05-08 MED ORDER — ENOXAPARIN SODIUM 60 MG/0.6ML IJ SOSY
50.0000 mg | PREFILLED_SYRINGE | INTRAMUSCULAR | Status: DC
  Administered 2022-05-09 – 2022-05-12 (×4): 50 mg via SUBCUTANEOUS
  Filled 2022-05-08 (×5): qty 0.6

## 2022-05-08 MED ORDER — PRENATAL MULTIVITAMIN CH
1.0000 | ORAL_TABLET | Freq: Every day | ORAL | Status: DC
  Administered 2022-05-09 – 2022-05-14 (×4): 1 via ORAL
  Filled 2022-05-08 (×4): qty 1

## 2022-05-08 MED ORDER — CALCIUM CARBONATE ANTACID 500 MG PO CHEW
2.0000 | CHEWABLE_TABLET | ORAL | Status: DC | PRN
  Administered 2022-05-09: 400 mg via ORAL
  Filled 2022-05-08 (×2): qty 2

## 2022-05-08 MED ORDER — ENOXAPARIN SODIUM 40 MG/0.4ML IJ SOSY: 40.0000 mg | PREFILLED_SYRINGE | INTRAMUSCULAR | Status: DC

## 2022-05-08 MED ORDER — ACETAMINOPHEN 500 MG PO TABS
1000.0000 mg | ORAL_TABLET | Freq: Four times a day (QID) | ORAL | Status: DC | PRN
  Administered 2022-05-08 – 2022-05-09 (×4): 1000 mg via ORAL
  Filled 2022-05-08 (×5): qty 2

## 2022-05-08 MED ORDER — SODIUM CHLORIDE 0.9 % IV SOLN: INTRAVENOUS | Status: DC

## 2022-05-08 MED ORDER — ZOLPIDEM TARTRATE 5 MG PO TABS: 5.0000 mg | ORAL_TABLET | Freq: Every evening | ORAL | Status: DC | PRN

## 2022-05-08 MED ORDER — SODIUM CHLORIDE 0.9 % IV SOLN
1.0000 g | Freq: Two times a day (BID) | INTRAVENOUS | Status: DC
  Administered 2022-05-08 (×2): 1 g via INTRAVENOUS
  Filled 2022-05-08 (×3): qty 10

## 2022-05-08 MED ORDER — ACETAMINOPHEN 500 MG PO TABS
1000.0000 mg | ORAL_TABLET | Freq: Once | ORAL | Status: AC
  Administered 2022-05-08: 1000 mg via ORAL
  Filled 2022-05-08: qty 2

## 2022-05-08 MED ORDER — LACTATED RINGERS IV BOLUS
1000.0000 mL | Freq: Once | INTRAVENOUS | Status: AC
  Administered 2022-05-08: 1000 mL via INTRAVENOUS

## 2022-05-08 NOTE — MAU Provider Note (Signed)
History     CSN: [id number]  Arrival date and time: 05/08/22 1047   Event Date/Time   First Provider Initiated Contact with Patient 05/08/22 1102      Chief Complaint  Patient presents with   Flank Pain   Chills   Nausea   Flank Pain This is a new problem. The current episode started in the past 7 days. The problem occurs constantly. The problem has been gradually worsening since onset. The quality of the pain is described as aching and shooting. The pain does not radiate. The pain is at a severity of 5/10. The pain is moderate. The pain is The same all the time. The symptoms are aggravated by bending, coughing and position. Associated symptoms include a fever. Pertinent negatives include no abdominal pain, chest pain, dysuria, headaches or weakness. Risk factors include pregnancy. She has tried analgesics and NSAIDs for the symptoms. The treatment provided mild relief.     [id number] at [redacted]w[redacted]d presenting with 4d of fever, chills, nausea and left sided flank pain. She has history of pyelo in a prior pregnancy. She reports she did not have preceding dysuria, polyuria. She last at at 10 PM. She has been throwing up bile that is non bloody. She has been having trouble keeping food and water down due to vomiting for about 4 days as well. She took tylenol at home and this helped break her fever but it returned. She reports the tylenol made her sweat and then get cold.   +FM, denies LOF, VB, contractions, vaginal discharge. Reports some braxton hick contractions.   OB History     Gravida  5   Para  3   Term  3   Preterm      AB  1   Living  3      SAB      IAB  1   Ectopic      Multiple  0   Live Births  3           Past Medical History:  Diagnosis Date   Asthma    as a child   Bipolar 1 disorder ([place])    Defiant behavior    Hypertension    Knee dislocation     Past Surgical History:  Procedure Laterality Date   CESAREAN SECTION N/A 06/29/2017   Procedure:  CESAREAN SECTION;  Surgeon: Mora Bellman, MD;  Location: [facility];  Service: Obstetrics;  Laterality: N/A;   TONSILLECTOMY      Family History  Problem Relation Age of Onset   Diabetes Mother    Hypertension Mother    Diabetes Maternal Grandmother    Hypertension Maternal Grandmother     Social History   Tobacco Use   Smoking status: Never   Smokeless tobacco: Never  Vaping Use   Vaping Use: Never used  Substance Use Topics   Alcohol use: No    Comment: not while preg   Drug use: Not Currently    Frequency: 7.0 times per week    Types: Marijuana    Comment: None in last week    Allergies: No Known Allergies  Medications Prior to Admission  Medication Sig Dispense Refill Last Dose   acetaminophen (TYLENOL) 325 MG tablet Take 2 tablets (650 mg total) by mouth every 4 (four) hours as needed (for pain scale < 4). 30 tablet 0    aspirin EC 81 MG tablet Take 1 tablet (81 mg total) by mouth daily. Take daily until delivery  for preeclampsia prevention (Patient not taking: Reported on 03/28/2022) 30 tablet 12    Blood Pressure Monitoring (BLOOD PRESSURE KIT) DEVI 1 kit by Does not apply route once a week. 1 each 0    metroNIDAZOLE (FLAGYL) 500 MG tablet Take 1 tablet (500 mg total) by mouth 2 (two) times daily. 14 tablet 0    Misc. Devices (GOJJI WEIGHT SCALE) MISC 1 Device by Does not apply route every 30 (thirty) days. 1 each 0    prenatal vitamin w/FE, FA (PRENATAL 1 + 1) 27-1 MG TABS tablet Take 1 tablet by mouth daily at 12 noon.       Review of Systems  Constitutional:  Positive for fever. Negative for chills.  HENT:  Negative for congestion and sore throat.   Eyes:  Negative for pain and visual disturbance.  Respiratory:  Negative for cough, chest tightness and shortness of breath.   Cardiovascular:  Negative for chest pain.  Gastrointestinal:  Negative for abdominal pain, diarrhea, nausea and vomiting.  Endocrine: Negative for cold intolerance and heat  intolerance.  Genitourinary:  Positive for difficulty urinating (Low UOP, last urinated at 3 AM. Only able to make ~29ml just now) and flank pain. Negative for dysuria, frequency and vaginal bleeding.  Musculoskeletal:  Negative for back pain.  Skin:  Negative for rash.  Allergic/Immunologic: Negative for food allergies.  Neurological:  Negative for dizziness, weakness, light-headedness and headaches.  Psychiatric/Behavioral:  Negative for agitation.    Physical Exam   Blood pressure 108/70, pulse (!) 112, temperature (!) 102.4 F (39.1 C), temperature source Oral, last menstrual period 10/07/2021, SpO2 97 %, unknown if currently breastfeeding.  Physical Exam Vitals and nursing note reviewed.  Constitutional:      General: She is not in acute distress.    Appearance: She is well-developed.     Comments: Pregnant female. Ill appearing. Tachypnea but speaking in full sentences and able to provider her history  HENT:     Head: Normocephalic and atraumatic.  Eyes:     General: No scleral icterus.    Conjunctiva/sclera: Conjunctivae normal.  Cardiovascular:     Rate and Rhythm: Tachycardia present.     Pulses: Normal pulses.  Pulmonary:     Effort: Pulmonary effort is normal. Tachypnea present.     Breath sounds: Normal breath sounds. No stridor, decreased air movement or transmitted upper airway sounds. No decreased breath sounds, wheezing or rhonchi.  Chest:     Chest wall: No tenderness.  Abdominal:     Palpations: Abdomen is soft.     Tenderness: There is no abdominal tenderness. There is left CVA tenderness. There is no guarding or rebound.     Comments: Gravid   Genitourinary:    Vagina: Normal.  Musculoskeletal:        General: Normal range of motion.     Cervical back: Normal range of motion and neck supple.  Skin:    General: Skin is warm and dry.     Coloration: Skin is not cyanotic or pale.     Findings: No rash.     Comments: Hot to touch  Neurological:      Mental Status: She is alert and oriented to person, place, and time.    MAU Course  Procedures  MDM- High   Assessment and Plan  SEE H&P  Holly Greene [facility] 05/08/2022, 11:22 AM

## 2022-05-08 NOTE — MAU Note (Signed)
.  Holly Greene is a 27 y.o. at [redacted]w[redacted]d here in MAU reporting: arrived via EMS with complaints of left-sided flank pain (10/10) for the past 4-5 days with fever and chills the last two days. She has been nauseous/vomiting and unable to keep anything down today. She last ate around 3-4 days ago, she takes sips of water but is unable to keep it down. She reports that the zofran that EMS gave her did help a little bit. Denies VB. She just noticed some thick, white discharge while using the restroom in MAU. She reports good FM.  She has tried taking Tylenol and did not get any relief. Last took tylenol around 0300 this morning.  EMS gave 4 mg of Zofran and 700 cc bolus of IVF. IV access in left AC.   LMP: N/A Onset of complaint: 5 days ago Pain score: 10/10 Vitals:   05/08/22 1057  BP: (!) 106/56  Pulse: (!) 124  Temp: (!) 102.4 F (39.1 C)  SpO2: 97%     FHT:181 Lab orders placed from triage:  UA

## 2022-05-08 NOTE — H&P (Signed)
Antenatal Admission H&P  Holly Greene is a 27 y.o. female presenting for       Chief Complaint  Patient presents with   Flank Pain   Chills   Nausea    Arrival date and time: 05/08/22 1047    Event Date/Time   First Provider Initiated Contact with Patient 05/08/22 1102       Flank Pain This is a new problem. The current episode started in the past 7 days. The problem occurs constantly. The problem has been gradually worsening since onset. The quality of the pain is described as aching and shooting. The pain does not radiate. The pain is at a severity of 8/10. The pain is moderate. The pain is The same all the time. The symptoms are aggravated by bending, coughing and position. Associated symptoms include a fever. Pertinent negatives include no abdominal pain, chest pain, dysuria, headaches or weakness. Risk factors include pregnancy. She has tried analgesics and NSAIDs for the symptoms. The treatment provided mild relief.      [id number] at [redacted]w[redacted]d presenting with 4d of fever, chills, nausea and left sided flank pain. She has history of pyelo in a prior pregnancy. She reports she did not have preceding dysuria, polyuria. She last at at 10 PM. She has been throwing up bile that is non bloody. She is throwing up several times per day. She has been having trouble keeping food and water down due to vomiting for about 4 days as well. She took tylenol at home and this helped break her fever but it returned. She reports the tylenol made her sweat and then get cold.    +FM, denies LOF, VB, contractions, vaginal discharge. Reports some braxton hick contractions..  OB History     Gravida  5   Para  3   Term  3   Preterm      AB  1   Living  3      SAB      IAB  1   Ectopic      Multiple  0   Live Births  3          Past Medical History:  Diagnosis Date   Asthma    as a child   Bipolar 1 disorder ([place])    Defiant behavior    Hypertension    Knee dislocation    Past  Surgical History:  Procedure Laterality Date   CESAREAN SECTION N/A 06/29/2017   Procedure: CESAREAN SECTION;  Surgeon: Mora Bellman, MD;  Location: [place];  Service: Obstetrics;  Laterality: N/A;   TONSILLECTOMY     Family History: family history includes Diabetes in her maternal grandmother and mother; Hypertension in her maternal grandmother and mother. Social History:  reports that she has never smoked. She has never used smokeless tobacco. She reports that she does not currently use drugs after having used the following drugs: Marijuana. Frequency: 7.00 times per week. She reports that she does not drink alcohol.     Maternal Diabetes: No Genetic Screening: Normal Maternal Ultrasounds/Referrals: Normal Fetal Ultrasounds or other Referrals:  None Maternal Substance Abuse:  No Significant Maternal Medications:  None Significant Maternal Lab Results:  None Number of Prenatal Visits:Less than or equal to 3 verified prenatal visits Other Comments:   has not established prenatal care. Was thinking about termination. Has had 1 [place] 1/16 amd DNKA for her new OB    Blood pressure 108/70, pulse (!) 110, temperature 98.5 F (36.9 C), temperature  source Oral, resp. rate 20, last menstrual period 10/07/2021, SpO2 98 %, unknown if currently breastfeeding. Exam Physical Exam Vitals and nursing note reviewed.  Constitutional:      General: She is not in acute distress.    Appearance: She is well-developed.     Comments: Pregnant female. Ill appearing covered in blanket. Warm to touch.   HENT:     Head: Normocephalic and atraumatic.  Eyes:     General: No scleral icterus.    Conjunctiva/sclera: Conjunctivae normal.  Cardiovascular:     Rate and Rhythm: Normal rate.  Pulmonary:     Effort: Pulmonary effort is normal.  Chest:     Chest wall: No tenderness.  Abdominal:     Palpations: Abdomen is soft.     Tenderness: There is no abdominal tenderness. There is left CVA  tenderness. There is no guarding or rebound.     Comments: Gravid 30  Genitourinary:    Vagina: Normal.  Musculoskeletal:        General: Normal range of motion.     Cervical back: Normal range of motion and neck supple.  Skin:    General: Skin is warm and dry.     Findings: No rash.  Neurological:     Mental Status: She is alert and oriented to person, place, and time.       Prenatal labs: ABO, Rh: --/--/O POS (11/18 1525) Antibody: NEG (11/18 1525) Rubella:   RPR: NON REACTIVE (04/28 0758)  HBsAg:    HIV:    GBS:     Assessment/Plan: Admit for Sepsis with urinary/pyelonephritis likely source  #Sepsis from urinary source/pyelo:  - Rocephin BID  - IVF with potassium  - Transition to PO if afebrile x 24-48hrs - Will need PPX for remainder of pregnancy  #Hypokalemia - Likely due to vomiting - IV potassium 10mg  x 3  - Repeat  in AM  #Routine prenatal - had [place] - Needs prenatal labs (to be drawn tomorrow AM)  #Fetal Well being - Initial EFM showed tachycardia that improved with defervescence - BMZ not indicated right now, no ctx - NST q shift  Caren Macadam 05/08/2022, 1:47 PM

## 2022-05-08 NOTE — MAU Note (Signed)
CRITICAL VALUE STICKER  CRITICAL VALUE: Potassium 2.6   RECEIVER (on-site recipient of call): Herb Grays, RN  DATE & TIME NOTIFIED: 05/08/2022 at [place] (representative from lab): lab tech  MD NOTIFIED: Dr. Ernestina Patches  TIME OF NOTIFICATION: 05/08/2022 at 1303  RESPONSE: No new orders

## 2022-05-09 ENCOUNTER — Inpatient Hospital Stay (HOSPITAL_BASED_OUTPATIENT_CLINIC_OR_DEPARTMENT_OTHER): Payer: Medicaid Other

## 2022-05-09 DIAGNOSIS — O4693 Antepartum hemorrhage, unspecified, third trimester: Secondary | ICD-10-CM | POA: Diagnosis not present

## 2022-05-09 DIAGNOSIS — Z3A30 30 weeks gestation of pregnancy: Secondary | ICD-10-CM

## 2022-05-09 DIAGNOSIS — O4593 Premature separation of placenta, unspecified, third trimester: Secondary | ICD-10-CM

## 2022-05-09 DIAGNOSIS — O2303 Infections of kidney in pregnancy, third trimester: Secondary | ICD-10-CM

## 2022-05-09 LAB — COMPREHENSIVE METABOLIC PANEL
ALT: 11 U/L (ref 0–44)
AST: 28 U/L (ref 15–41)
Albumin: 2 g/dL — ABNORMAL LOW (ref 3.5–5.0)
Alkaline Phosphatase: 86 U/L (ref 38–126)
Anion gap: 7 (ref 5–15)
BUN: <5 mg/dL — ABNORMAL LOW (ref 6–20)
CO2: 20 mmol/L — ABNORMAL LOW (ref 22–32)
Calcium: 7.7 mg/dL — ABNORMAL LOW (ref 8.9–10.3)
Chloride: 107 mmol/L (ref 98–111)
Creatinine, Ser: 0.73 mg/dL (ref 0.44–1.00)
GFR, Estimated: >60 mL/min (ref >60)
Glucose, Bld: 103 mg/dL — ABNORMAL HIGH (ref 70–99)
Potassium: 3.1 mmol/L — ABNORMAL LOW (ref 3.5–5.1)
Sodium: 134 mmol/L — ABNORMAL LOW (ref 135–145)
Total Bilirubin: 1.7 mg/dL — ABNORMAL HIGH (ref 0.3–1.2)
Total Protein: 4.9 g/dL — ABNORMAL LOW (ref 6.5–8.1)

## 2022-05-09 LAB — BASIC METABOLIC PANEL
Anion gap: 9 (ref 5–15)
BUN: <5 mg/dL — ABNORMAL LOW (ref 6–20)
CO2: 18 mmol/L — ABNORMAL LOW (ref 22–32)
Calcium: 7.8 mg/dL — ABNORMAL LOW (ref 8.9–10.3)
Chloride: 106 mmol/L (ref 98–111)
Creatinine, Ser: 0.76 mg/dL (ref 0.44–1.00)
GFR, Estimated: >60 mL/min (ref >60)
Glucose, Bld: 123 mg/dL — ABNORMAL HIGH (ref 70–99)
Potassium: 2.8 mmol/L — ABNORMAL LOW (ref 3.5–5.1)
Sodium: 133 mmol/L — ABNORMAL LOW (ref 135–145)

## 2022-05-09 LAB — PREPARE RBC (CROSSMATCH)

## 2022-05-09 LAB — RAPID HIV SCREEN (HIV 1/2 AB+AG)
HIV 1/2 Antibodies: NONREACTIVE
HIV-1 P24 Antigen - HIV24: NONREACTIVE

## 2022-05-09 LAB — CBC
HCT: 25.1 % — ABNORMAL LOW (ref 36.0–46.0)
Hemoglobin: 8.9 g/dL — ABNORMAL LOW (ref 12.0–15.0)
MCH: 32.4 pg (ref 26.0–34.0)
MCHC: 35.5 g/dL (ref 30.0–36.0)
MCV: 91.3 fL (ref 80.0–100.0)
Platelets: 141 10*3/uL — ABNORMAL LOW (ref 150–400)
RBC: 2.75 MIL/uL — ABNORMAL LOW (ref 3.87–5.11)
RDW: 12.4 % (ref 11.5–15.5)
WBC: 9.7 10*3/uL (ref 4.0–10.5)
nRBC: 0 % (ref 0.0–0.2)

## 2022-05-09 LAB — HEPATITIS B SURFACE ANTIGEN: Hepatitis B Surface Ag: NONREACTIVE

## 2022-05-09 LAB — APTT: aPTT: 34 seconds (ref 24–36)

## 2022-05-09 LAB — FIBRINOGEN: Fibrinogen: 574 mg/dL — ABNORMAL HIGH (ref 210–475)

## 2022-05-09 LAB — PROTIME-INR
INR: 1.1 (ref 0.8–1.2)
Prothrombin Time: 14.4 seconds (ref 11.4–15.2)

## 2022-05-09 LAB — MAGNESIUM: Magnesium: 1.5 mg/dL — ABNORMAL LOW (ref 1.7–2.4)

## 2022-05-09 LAB — HEMOGLOBIN A1C
Hgb A1c MFr Bld: 4.8 % (ref 4.8–5.6)
Mean Plasma Glucose: 91.06 mg/dL

## 2022-05-09 LAB — RPR: RPR Ser Ql: NONREACTIVE

## 2022-05-09 MED ORDER — POTASSIUM CHLORIDE CRYS ER 20 MEQ PO TBCR
40.0000 meq | EXTENDED_RELEASE_TABLET | Freq: Two times a day (BID) | ORAL | Status: AC
  Administered 2022-05-09 – 2022-05-10 (×3): 40 meq via ORAL
  Filled 2022-05-09 (×4): qty 2

## 2022-05-09 MED ORDER — SODIUM CHLORIDE 0.9 % IV SOLN
2.0000 g | INTRAVENOUS | Status: DC
  Administered 2022-05-09 – 2022-05-10 (×2): 2 g via INTRAVENOUS
  Filled 2022-05-09 (×2): qty 20

## 2022-05-09 MED ORDER — MAGNESIUM SULFATE 40 GM/1000ML IV SOLN
2.0000 g/h | INTRAVENOUS | Status: DC
  Administered 2022-05-09: 2 g/h via INTRAVENOUS
  Filled 2022-05-09: qty 1000

## 2022-05-09 MED ORDER — SODIUM CHLORIDE 0.9 % IV SOLN: INTRAVENOUS | Status: DC

## 2022-05-09 MED ORDER — SODIUM CHLORIDE 0.9% IV SOLUTION: Freq: Once | INTRAVENOUS | Status: AC

## 2022-05-09 MED ORDER — MAGNESIUM SULFATE BOLUS VIA INFUSION
6.0000 g | Freq: Once | INTRAVENOUS | Status: AC
  Administered 2022-05-09: 6 g via INTRAVENOUS
  Filled 2022-05-09: qty 1000

## 2022-05-09 MED ORDER — BETAMETHASONE SOD PHOS & ACET 6 (3-3) MG/ML IJ SUSP
12.0000 mg | INTRAMUSCULAR | Status: AC
  Administered 2022-05-09 – 2022-05-10 (×2): 12 mg via INTRAMUSCULAR
  Filled 2022-05-09 (×2): qty 5

## 2022-05-09 NOTE — Progress Notes (Signed)
Patient ID: Holly Greene, female   DOB: [birth year], 27 y.o.   MRN: [id number] Called by nursing staff reporting heavy vaginal bleeding. Patient found lying in bed reporting feeling well. She reports onset of vaginal bleeding when she used the restroom. Bleeding is bright red with small clots a the bottom of the toilet bowl. She denies any abdominal pain. She reports resolution of her flank pain. She reports good fetal movement. She reports occasional contractions  Blood pressure 114/69, pulse (!) 103, temperature 98.1 F (36.7 C), temperature source Oral, resp. rate 18, height 5\' 6"  (1.676 m), weight 94.2 kg, last menstrual period 10/07/2021, SpO2 99 %, unknown if currently breastfeeding. GENERAL: Well-developed, well-nourished female in no acute distress.  LUNGS: Clear to auscultation bilaterally.  HEART: Regular rate and rhythm. ABDOMEN: Soft, nontender, gravid PELVIC: Not active vaginal bleeding EXTREMITIES: No cyanosis, clubbing, or edema, 2+ distal pulses.  Baseline 120, mod variability, +accels, no decels Toco: rare contractions  A/P 27 yo P3013 at 30w4 admitted with pyelonephritis and now with presumed placental abruption - Patient with previous cesarean section and desires TOLAC - Will start magnesium sulfate for neuroprotection given second episode of vaginal bleeding in less than 24 hours.  - Ob ultrasound ordered - Discussed benefits of blood transfusion given current anemia and patient is agreeable to plan - Continue close monitoring

## 2022-05-09 NOTE — Progress Notes (Signed)
FACULTY PRACTICE ANTEPARTUM COMPREHENSIVE PROGRESS NOTE  Holly Greene is a 27 y.o. [phone] at [redacted]w[redacted]d who is admitted for suspected pyelonephritis, now with vaginal bleeding concerning for marginal abruption.  Estimated Date of Delivery: 07/14/22 Fetal presentation is cephalic.  Length of Stay:  1 Days. Admitted 05/08/2022  Subjective: Reports vaginal bleeding that started last night. Passed a golf ball sized clot, now just having dark red blood with wiping. SSE performed - cervix visually 1cm dilated with dark old blood at os, no active bleeding. SCE deferred. Has intermittent cramping/ctx.  Reports that her pain is overall improving and she is feeling better overall.  No LOF. Good fetal movement.   Vitals:  Blood pressure (!) 114/100, pulse (!) 107, temperature 98.2 F (36.8 C), temperature source Oral, resp. rate 20, height 5\' 6"  (1.676 m), weight 94.2 kg, last menstrual period 10/07/2021, SpO2 100 %, unknown if currently breastfeeding. Physical Examination: CONSTITUTIONAL: Well-developed, well-nourished female in no acute distress.  NEUROLOGIC: Alert and oriented to person, place, and time. No cranial nerve deficit noted. PSYCHIATRIC: Normal mood and affect. Normal behavior. Normal judgment and thought content. CARDIOVASCULAR: Normal heart rate noted, regular rhythm RESPIRATORY: Effort and breath sounds normal, no problems with respiration noted MUSCULOSKELETAL: Normal range of motion. No edema and no tenderness. 2+ distal pulses. ABDOMEN: Soft, nontender, nondistended, gravid. PELVIC: Performed in presence of chaperone. SSE - cervix visually 1cm dilated with dark old blood at the os and in the vaginal vault (<5cc), no active bleeding  Fetal monitoring: FHR: 160 bpm, Variability: moderate, Accelerations: Present (10x10), Decelerations: Absent  Uterine activity: Irritable  Results for orders placed or performed during the hospital encounter of 05/08/22 (from the past 48 hour(s))  Type  and screen [facility]     Status: None   Collection Time: 05/08/22 11:14 AM  Result Value Ref Range   ABO/RH(D) O POS    Antibody Screen NEG    Sample Expiration      05/11/2022,2359 Performed at [facility], [address] [place], [place]   Urinalysis, Routine w reflex microscopic -Urine, Clean Catch     Status: Abnormal   Collection Time: 05/08/22 11:15 AM  Result Value Ref Range   Color, Urine AMBER (A) YELLOW    Comment: BIOCHEMICALS MAY BE AFFECTED BY COLOR   APPearance CLOUDY (A) CLEAR   Specific Gravity, Urine 1.014 1.005 - 1.030   pH 6.0 5.0 - 8.0   Glucose, UA NEGATIVE NEGATIVE mg/dL   Hgb urine dipstick NEGATIVE NEGATIVE   Bilirubin Urine NEGATIVE NEGATIVE   Ketones, ur 80 (A) NEGATIVE mg/dL   Protein, ur 05/10/22 (A) NEGATIVE mg/dL   Nitrite NEGATIVE NEGATIVE   Leukocytes,Ua LARGE (A) NEGATIVE   RBC / HPF 6-10 0 - 5 RBC/hpf   WBC, UA >50 0 - 5 WBC/hpf   Bacteria, UA MANY (A) NONE SEEN   Squamous Epithelial / HPF 0-5 0 - 5 /HPF   WBC Clumps PRESENT    Mucus PRESENT     Comment: Performed at [facility], [address] [place], [place]  CBC     Status: Abnormal   Collection Time: 05/08/22 11:15 AM  Result Value Ref Range   WBC 10.5 4.0 - 10.5 K/uL   RBC 3.09 (L) 3.87 - 5.11 MIL/uL   Hemoglobin 9.7 (L) 12.0 - 15.0 g/dL   HCT 05/10/22 (L) 38.2 - 50.5 %   MCV 91.6 80.0 - 100.0 fL   MCH 31.4 26.0 -  34.0 pg   MCHC 34.3 30.0 - 36.0 g/dL   RDW 12.0 11.5 - 15.5 %   Platelets 161 150 - 400 K/uL   nRBC 0.0 0.0 - 0.2 %    Comment: Performed at [facility], [place] [address], [place], [place] [address]  Comprehensive metabolic panel     Status: Abnormal   Collection Time: 05/08/22 11:15 AM  Result Value Ref Range   Sodium 131 (L) 135 - 145 mmol/L   Potassium 2.6 (LL) 3.5 - 5.1 mmol/L    Comment: CRITICAL RESULT CALLED TO, READ BACK BY AND VERIFIED WITH W.DICKINS RN 3016 05/08/22 MCCORMICK K   Chloride 100 98 - 111  mmol/L   CO2 19 (L) 22 - 32 mmol/L   Glucose, Bld 109 (H) 70 - 99 mg/dL    Comment: Glucose reference range applies only to samples taken after fasting for at least 8 hours.   BUN <5 (L) 6 - 20 mg/dL   Creatinine, Ser 0.77 0.44 - 1.00 mg/dL   Calcium 8.0 (L) 8.9 - 10.3 mg/dL   Total Protein 5.7 (L) 6.5 - 8.1 g/dL   Albumin 2.5 (L) 3.5 - 5.0 g/dL   AST 34 15 - 41 U/L   ALT 8 0 - 44 U/L   Alkaline Phosphatase 86 38 - 126 U/L   Total Bilirubin 1.2 0.3 - 1.2 mg/dL   GFR, Estimated >60 >60 mL/min    Comment: (NOTE) Calculated using the CKD-EPI Creatinine Equation (2021)    Anion gap 12 5 - 15    Comment: Performed at [facility], [place] [address], [place], [place] [address]  Magnesium     Status: Abnormal   Collection Time: 05/08/22 11:15 AM  Result Value Ref Range   Magnesium 1.5 (L) 1.7 - 2.4 mg/dL    Comment: Performed at [facility] [address], [place], [place] [address]  Lactic acid, plasma     Status: None   Collection Time: 05/08/22 11:16 AM  Result Value Ref Range   Lactic Acid, Venous 1.1 0.5 - 1.9 mmol/L    Comment: Performed at [facility] [address] [place], [place] [address]  Comprehensive metabolic panel     Status: Abnormal   Collection Time: 05/09/22  5:30 AM  Result Value Ref Range   Sodium 134 (L) 135 - 145 mmol/L   Potassium 3.1 (L) 3.5 - 5.1 mmol/L   Chloride 107 98 - 111 mmol/L   CO2 20 (L) 22 - 32 mmol/L   Glucose, Bld 103 (H) 70 - 99 mg/dL    Comment: Glucose reference range applies only to samples taken after fasting for at least 8 hours.   BUN <5 (L) 6 - 20 mg/dL   Creatinine, Ser 0.73 0.44 - 1.00 mg/dL   Calcium 7.7 (L) 8.9 - 10.3 mg/dL   Total Protein 4.9 (L) 6.5 - 8.1 g/dL   Albumin 2.0 (L) 3.5 - 5.0 g/dL   AST 28 15 - 41 U/L   ALT 11 0 - 44 U/L   Alkaline Phosphatase 86 38 - 126 U/L   Total Bilirubin 1.7 (H) 0.3 - 1.2 mg/dL   GFR, Estimated >60 >60 mL/min    Comment: (NOTE) Calculated using the CKD-EPI Creatinine  Equation (2021)    Anion gap 7 5 - 15    Comment: Performed at [facility], [place] [address] [place], [place] [address]  Rapid HIV screen (HIV 1/2 Ab+Ag)  Status: None   Collection Time: 05/09/22  5:30 AM  Result Value Ref Range   HIV-1 P24 Antigen - HIV24 NON REACTIVE NON REACTIVE    Comment: (NOTE) Detection of p24 may be inhibited by biotin in the sample, causing false negative results in acute infection.    HIV 1/2 Antibodies NON REACTIVE NON REACTIVE   Interpretation (HIV Ag Ab)      A non reactive test result means that HIV 1 or HIV 2 antibodies and HIV 1 p24 antigen were not detected in the specimen.    Comment: Performed at [facility], [place] [address], [place], [place] [address]  Hemoglobin A1c     Status: None   Collection Time: 05/09/22  5:30 AM  Result Value Ref Range   Hgb A1c MFr Bld 4.8 4.8 - 5.6 %    Comment: (NOTE) Pre diabetes:          5.7%-6.4%  Diabetes:              >6.4%  Glycemic control for   <7.0% adults with diabetes    Mean Plasma Glucose 91.06 mg/dL    Comment: Performed at [facility] [address], [place], [place] [address]  Protime-INR     Status: None   Collection Time: 05/09/22  6:25 AM  Result Value Ref Range   Prothrombin Time 14.4 11.4 - 15.2 seconds   INR 1.1 0.8 - 1.2    Comment: (NOTE) INR goal varies based on device and disease states. Performed at [facility], [place] [address] [place], [place] [address]   APTT     Status: None   Collection Time: 05/09/22  6:25 AM  Result Value Ref Range   aPTT 34 24 - 36 seconds    Comment: Performed at [facility] [address] [place], [place] [address]  Fibrinogen     Status: Abnormal   Collection Time: 05/09/22  6:25 AM  Result Value Ref Range   Fibrinogen 574 (H) 210 - 475 mg/dL    Comment: (NOTE) Fibrinogen results may be underestimated in patients receiving thrombolytic therapy. Performed at [facility], [place] [address]  [place], [place] [address]     US RENAL  Result Date: 05/08/2022 CLINICAL DATA:  Acute flank pain for 7 days. Fever and chills. Thirty week pregnant patient. EXAM: RENAL / URINARY TRACT ULTRASOUND COMPLETE COMPARISON:  None Available. FINDINGS: Right Kidney: Renal measurements: 12.4 x 6.8 x 5.9 cm = volume: 260 mL. Echogenicity within normal limits. No mass or hydronephrosis visualized. Left Kidney: Renal measurements: 14.1 x 7.2 x 5.4 cm = volume: 289 mL. Echogenicity within normal limits. No mass or hydronephrosis visualized. Bladder: Appears normal for degree of bladder distention. Other: None. IMPRESSION: Normal study. No cause for the patient's symptoms identified. Electronically Signed   By: Dorise Bullion III M.D.   On: 05/08/2022 17:21    Current scheduled medications  betamethasone acetate-betamethasone sodium phosphate  12 mg Intramuscular Q24 Hr x 2   docusate sodium  100 mg Oral Daily   enoxaparin (LOVENOX) injection  50 mg Subcutaneous Q24H   prenatal multivitamin  1 tablet Oral Q1200   I have reviewed the patient's current medications.  ASSESSMENT: Principal Problem:   Pyelonephritis affecting pregnancy   PLAN: #Sepsis from urinary source/pyelo, improving  - Rocephin BID  - IVF with potassium - AM electrolytes pending - Transition to PO if afebrile x 24-48hrs - Will need PPX for  remainder of pregnancy  #Vaginal bleeding - Started 1/30 PM, total EBL 15cc in 24h - No recent intercourse or cervical exams per pt, suspect marginal abruption - Hgb pending, coags & fibrinogen normal 1/31 - BMZ ordered  #Hypokalemia - Likely due to vomiting - IV potassium 10mg  x 3  - Repeat pending   #Prior CS - CSx1, then VBAC x1 - MOD to be discussed  #FWB/Routine - Posterior placenta, growth AGA on 04/24/22 (@28 /3 1321g 60%) - Rh+, HIV NR, A1c 4.8 - remainder pending - NST q shift - BMZ ordered 1/31 given new bleeding c/f marginal abruption  Continue antenatal care.  Gale Journey, MD [place], [facility] for [employer], [place]

## 2022-05-10 DIAGNOSIS — O4593 Premature separation of placenta, unspecified, third trimester: Secondary | ICD-10-CM | POA: Diagnosis not present

## 2022-05-10 DIAGNOSIS — O2303 Infections of kidney in pregnancy, third trimester: Secondary | ICD-10-CM | POA: Diagnosis not present

## 2022-05-10 DIAGNOSIS — Z3A30 30 weeks gestation of pregnancy: Secondary | ICD-10-CM | POA: Diagnosis not present

## 2022-05-10 LAB — CBC WITH DIFFERENTIAL/PLATELET
Abs Immature Granulocytes: 0.24 10*3/uL — ABNORMAL HIGH (ref 0.00–0.07)
Basophils Absolute: 0 10*3/uL (ref 0.0–0.1)
Basophils Relative: 0 %
Eosinophils Absolute: 0 10*3/uL (ref 0.0–0.5)
Eosinophils Relative: 0 %
HCT: 29.7 % — ABNORMAL LOW (ref 36.0–46.0)
Hemoglobin: 10.7 g/dL — ABNORMAL LOW (ref 12.0–15.0)
Immature Granulocytes: 2 %
Lymphocytes Relative: 7 %
Lymphs Abs: 0.8 10*3/uL (ref 0.7–4.0)
MCH: 32.2 pg (ref 26.0–34.0)
MCHC: 36 g/dL (ref 30.0–36.0)
MCV: 89.5 fL (ref 80.0–100.0)
Monocytes Absolute: 0.7 10*3/uL (ref 0.1–1.0)
Monocytes Relative: 6 %
Neutro Abs: 9.7 10*3/uL — ABNORMAL HIGH (ref 1.7–7.7)
Neutrophils Relative %: 85 %
Platelets: 150 10*3/uL (ref 150–400)
RBC: 3.32 MIL/uL — ABNORMAL LOW (ref 3.87–5.11)
RDW: 12.8 % (ref 11.5–15.5)
WBC: 11.5 10*3/uL — ABNORMAL HIGH (ref 4.0–10.5)
nRBC: 0 % (ref 0.0–0.2)

## 2022-05-10 LAB — BPAM RBC
Blood Product Expiration Date: 202403042359
Blood Product Expiration Date: 202403042359
ISSUE DATE / TIME: 202401311426
ISSUE DATE / TIME: 202401311740
Unit Type and Rh: 5100
Unit Type and Rh: 5100

## 2022-05-10 LAB — RUBELLA SCREEN: Rubella: 1.66 index (ref >0.99)

## 2022-05-10 LAB — CULTURE, OB URINE: Culture: >=100000 — AB

## 2022-05-10 LAB — TYPE AND SCREEN
ABO/RH(D): O POS
Antibody Screen: NEGATIVE
Unit division: 0
Unit division: 0

## 2022-05-10 LAB — BASIC METABOLIC PANEL
Anion gap: 7 (ref 5–15)
BUN: <5 mg/dL — ABNORMAL LOW (ref 6–20)
CO2: 20 mmol/L — ABNORMAL LOW (ref 22–32)
Calcium: 7.3 mg/dL — ABNORMAL LOW (ref 8.9–10.3)
Chloride: 109 mmol/L (ref 98–111)
Creatinine, Ser: 0.57 mg/dL (ref 0.44–1.00)
GFR, Estimated: >60 mL/min (ref >60)
Glucose, Bld: 113 mg/dL — ABNORMAL HIGH (ref 70–99)
Potassium: 3.6 mmol/L (ref 3.5–5.1)
Sodium: 136 mmol/L (ref 135–145)

## 2022-05-10 LAB — MAGNESIUM: Magnesium: 3.1 mg/dL — ABNORMAL HIGH (ref 1.7–2.4)

## 2022-05-10 MED ORDER — SODIUM CHLORIDE 0.9 % IV SOLN: INTRAVENOUS | Status: DC

## 2022-05-10 MED ORDER — AMOXICILLIN-POT CLAVULANATE 875-125 MG PO TABS
1.0000 | ORAL_TABLET | Freq: Two times a day (BID) | ORAL | Status: DC
  Administered 2022-05-11: 1 via ORAL
  Filled 2022-05-10: qty 1

## 2022-05-10 MED ORDER — POTASSIUM CHLORIDE IN NACL 40-0.9 MEQ/L-% IV SOLN
INTRAVENOUS | Status: DC
  Filled 2022-05-10: qty 1000

## 2022-05-10 MED ORDER — ALUM & MAG HYDROXIDE-SIMETH 200-200-20 MG/5ML PO SUSP
30.0000 mL | Freq: Four times a day (QID) | ORAL | Status: DC | PRN
  Administered 2022-05-10 (×3): 30 mL via ORAL
  Filled 2022-05-10 (×3): qty 30

## 2022-05-10 MED ORDER — MAGNESIUM SULFATE 2 GM/50ML IV SOLN
2.0000 g | Freq: Once | INTRAVENOUS | Status: DC
  Filled 2022-05-10: qty 50

## 2022-05-10 NOTE — Progress Notes (Addendum)
[place] NOTE  Holly Greene is a 27 y.o. [id number] at [redacted]w[redacted]d who is admitted for suspected pyelonephritis, now with vaginal bleeding concerning for marginal abruption.  Estimated Date of Delivery: 07/14/22 Fetal presentation is cephalic.  Length of Stay:  2 Days. Admitted 05/08/2022  Subjective: Vaginal bleeding significantly improved, now very light. Feeling much better overall. Denies abdominal pain, LOF. +FM  Vitals:  Blood pressure 100/61, pulse 90, temperature 97.6 F (36.4 C), temperature source Oral, resp. rate 17, height 5\' 6"  (1.676 m), weight 94.2 kg, last menstrual period 10/07/2021, SpO2 100 %, unknown if currently breastfeeding. Physical Examination: CONSTITUTIONAL: Well-developed, well-nourished female in no acute distress.  NEUROLOGIC: Alert and oriented to person, place, and time. No cranial nerve deficit noted. PSYCHIATRIC: Normal mood and affect. Normal behavior. Normal judgment and thought content. CARDIOVASCULAR: Normal heart rate noted, regular rhythm RESPIRATORY: Effort and breath sounds normal, no problems with respiration noted MUSCULOSKELETAL: Normal range of motion. No edema and no tenderness. 2+ distal pulses. ABDOMEN: Soft, nontender, nondistended, gravid. PELVIC: From 2/1: Performed in presence of chaperone. SSE - cervix visually 1cm dilated with dark old blood at the os and in the vaginal vault (<5cc), no active bleeding  Fetal monitoring: FHR: 120 bpm, Variability: moderate, Accelerations: Present (10x10), Decelerations: Absent  Uterine activity: Irritable  Results for orders placed or performed during the hospital encounter of 05/08/22 (from the past 48 hour(s))  Culture, OB Urine     Status: Abnormal (Preliminary result)   Collection Time: 05/08/22 11:00 AM   Specimen: Urine, Random  Result Value Ref Range   Specimen Description URINE, RANDOM    Special Requests NONE    Culture (A)     >=100,000 COLONIES/mL GRAM  NEGATIVE RODS SUSCEPTIBILITIES TO FOLLOW NO GROUP B STREP (S.AGALACTIAE) ISOLATED Performed at [facility], [address] [place], [place] [address]    Report Status PENDING   Type and screen [place]     Status: None (Preliminary result)   Collection Time: 05/08/22 11:14 AM  Result Value Ref Range   ABO/RH(D) O POS    Antibody Screen NEG    Sample Expiration 05/11/2022,2359    Unit Number [id number]    Blood Component Type RED CELLS,LR    Unit division 00    Status of Unit ISSUED    Transfusion Status OK TO TRANSFUSE    Crossmatch Result Compatible    Unit Number [id number]    Blood Component Type RED CELLS,LR    Unit division 00    Status of Unit ISSUED    Transfusion Status OK TO TRANSFUSE    Crossmatch Result      Compatible Performed at [facility], [place] [address] [place], [place] [address]   Urinalysis, Routine w reflex microscopic -Urine, Clean Catch     Status: Abnormal   Collection Time: 05/08/22 11:15 AM  Result Value Ref Range   Color, Urine AMBER (A) YELLOW    Comment: BIOCHEMICALS MAY BE AFFECTED BY COLOR   APPearance CLOUDY (A) CLEAR   Specific Gravity, Urine 1.014 1.005 - 1.030   pH 6.0 5.0 - 8.0   Glucose, UA NEGATIVE NEGATIVE mg/dL   Hgb urine dipstick NEGATIVE NEGATIVE   Bilirubin Urine NEGATIVE NEGATIVE   Ketones, ur 80 (A) NEGATIVE mg/dL   Protein, ur 100 (A) NEGATIVE mg/dL   Nitrite NEGATIVE NEGATIVE   Leukocytes,Ua LARGE (A) NEGATIVE   RBC / HPF 6-10 0 - 5 RBC/hpf   WBC,  UA >50 0 - 5 WBC/hpf   Bacteria, UA MANY (A) NONE SEEN   Squamous Epithelial / HPF 0-5 0 - 5 /HPF   WBC Clumps PRESENT    Mucus PRESENT     Comment: Performed at [facility], [address] [place], [place] [address]  CBC     Status: Abnormal   Collection Time: 05/08/22 11:15 AM  Result Value Ref Range   WBC 10.5 4.0 - 10.5 K/uL   RBC 3.09 (L) 3.87 - 5.11 MIL/uL   Hemoglobin 9.7 (L) 12.0 - 15.0 g/dL   HCT 87.5 (L) 64.3 -  46.0 %   MCV 91.6 80.0 - 100.0 fL   MCH 31.4 26.0 - 34.0 pg   MCHC 34.3 30.0 - 36.0 g/dL   RDW 32.9 51.8 - 84.1 %   Platelets 161 150 - 400 K/uL   nRBC 0.0 0.0 - 0.2 %    Comment: Performed at [facility], [address] [place], [place] [address]  Comprehensive metabolic panel     Status: Abnormal   Collection Time: 05/08/22 11:15 AM  Result Value Ref Range   Sodium 131 (L) 135 - 145 mmol/L   Potassium 2.6 (LL) 3.5 - 5.1 mmol/L    Comment: CRITICAL RESULT CALLED TO, READ BACK BY AND VERIFIED WITH W.DICKINS RN 1258 05/08/22 MCCORMICK K   Chloride 100 98 - 111 mmol/L   CO2 19 (L) 22 - 32 mmol/L   Glucose, Bld 109 (H) 70 - 99 mg/dL    Comment: Glucose reference range applies only to samples taken after fasting for at least 8 hours.   BUN <5 (L) 6 - 20 mg/dL   Creatinine, Ser 0.16 0.44 - 1.00 mg/dL   Calcium 8.0 (L) 8.9 - 10.3 mg/dL   Total Protein 5.7 (L) 6.5 - 8.1 g/dL   Albumin 2.5 (L) 3.5 - 5.0 g/dL   AST 34 15 - 41 U/L   ALT 8 0 - 44 U/L   Alkaline Phosphatase 86 38 - 126 U/L   Total Bilirubin 1.2 0.3 - 1.2 mg/dL   GFR, Estimated >01 >09 mL/min    Comment: (NOTE) Calculated using the CKD-EPI Creatinine Equation (2021)    Anion gap 12 5 - 15    Comment: Performed at [facility], [address], [place], [place] [address]  Culture, blood (Routine X 2) w Reflex to ID Panel     Status: None (Preliminary result)   Collection Time: 05/08/22 11:15 AM   Specimen: BLOOD  Result Value Ref Range   Specimen Description BLOOD BLOOD LEFT HAND    Special Requests      BOTTLES DRAWN AEROBIC AND ANAEROBIC Blood Culture adequate volume   Culture      NO GROWTH 2 DAYS Performed at [facility], [address] [place], [place] [address]    Report Status PENDING   Magnesium     Status: Abnormal   Collection Time: 05/08/22 11:15 AM  Result Value Ref Range   Magnesium 1.5 (L) 1.7 - 2.4 mg/dL    Comment: Performed at [facility], [address],  [place], [place] [address]  Culture, blood (Routine X 2) w Reflex to ID Panel     Status: None (Preliminary result)   Collection Time: 05/08/22 11:16 AM   Specimen: BLOOD  Result Value Ref Range   Specimen Description BLOOD RIGHT ANTECUBITAL    Special Requests      BOTTLES DRAWN  AEROBIC AND ANAEROBIC Blood Culture adequate volume   Culture      NO GROWTH 2 DAYS Performed at [facility], [place] [address] [place], [place] [address]    Report Status PENDING   Lactic acid, plasma     Status: None   Collection Time: 05/08/22 11:16 AM  Result Value Ref Range   Lactic Acid, Venous 1.1 0.5 - 1.9 mmol/L    Comment: Performed at [facility] [address], [place], [place] [address]  Comprehensive metabolic panel     Status: Abnormal   Collection Time: 05/09/22  5:30 AM  Result Value Ref Range   Sodium 134 (L) 135 - 145 mmol/L   Potassium 3.1 (L) 3.5 - 5.1 mmol/L   Chloride 107 98 - 111 mmol/L   CO2 20 (L) 22 - 32 mmol/L   Glucose, Bld 103 (H) 70 - 99 mg/dL    Comment: Glucose reference range applies only to samples taken after fasting for at least 8 hours.   BUN <5 (L) 6 - 20 mg/dL   Creatinine, Ser 0.73 0.44 - 1.00 mg/dL   Calcium 7.7 (L) 8.9 - 10.3 mg/dL   Total Protein 4.9 (L) 6.5 - 8.1 g/dL   Albumin 2.0 (L) 3.5 - 5.0 g/dL   AST 28 15 - 41 U/L   ALT 11 0 - 44 U/L   Alkaline Phosphatase 86 38 - 126 U/L   Total Bilirubin 1.7 (H) 0.3 - 1.2 mg/dL   GFR, Estimated >60 >60 mL/min    Comment: (NOTE) Calculated using the CKD-EPI Creatinine Equation (2021)    Anion gap 7 5 - 15    Comment: Performed at [facility], [place] [address] [place], [place] [address]  Hepatitis B surface antigen     Status: None   Collection Time: 05/09/22  5:30 AM  Result Value Ref Range   Hepatitis B Surface Ag NON REACTIVE NON REACTIVE    Comment: Performed at [facility] [address] [place], [place] [address]  Rubella screen     Status: None   Collection Time: 05/09/22  5:30 AM   Result Value Ref Range   Rubella 1.66 Immune >0.99 index    Comment: (NOTE)                                Non-immune       <0.90                                Equivocal  0.90 - 0.99                                Immune           >0.99 Performed At: [facility] [place], [place] [id number] Rush Farmer MD DG:[id number]   RPR     Status: None   Collection Time: 05/09/22  5:30 AM  Result Value Ref Range   RPR Ser Ql NON REACTIVE NON REACTIVE    Comment: Performed at [facility] [address] [place], [place] [address]  Rapid HIV screen (HIV 1/2 Ab+Ag)     Status: None   Collection Time: 05/09/22  5:30 AM  Result Value Ref Range   HIV-1 P24 Antigen -  HIV24 NON REACTIVE NON REACTIVE    Comment: (NOTE) Detection of p24 may be inhibited by biotin in the sample, causing false negative results in acute infection.    HIV 1/2 Antibodies NON REACTIVE NON REACTIVE   Interpretation (HIV Ag Ab)      A non reactive test result means that HIV 1 or HIV 2 antibodies and HIV 1 p24 antigen were not detected in the specimen.    Comment: Performed at [facility], [address] [place], [place] [address]  Hemoglobin A1c     Status: None   Collection Time: 05/09/22  5:30 AM  Result Value Ref Range   Hgb A1c MFr Bld 4.8 4.8 - 5.6 %    Comment: (NOTE) Pre diabetes:          5.7%-6.4%  Diabetes:              >6.4%  Glycemic control for   <7.0% adults with diabetes    Mean Plasma Glucose 91.06 mg/dL    Comment: Performed at [facility], [address], [place], [place] [address]  CBC     Status: Abnormal   Collection Time: 05/09/22  6:25 AM  Result Value Ref Range   WBC 9.7 4.0 - 10.5 K/uL   RBC 2.75 (L) 3.87 - 5.11 MIL/uL   Hemoglobin 8.9 (L) 12.0 - 15.0 g/dL   HCT 94.1 (L) 74.0 - 81.4 %   MCV 91.3 80.0 - 100.0 fL   MCH 32.4 26.0 - 34.0 pg   MCHC 35.5 30.0 - 36.0 g/dL   RDW 48.1 85.6 - 31.4 %   Platelets 141 (L) 150 - 400 K/uL     Comment: REPEATED TO VERIFY   nRBC 0.0 0.0 - 0.2 %    Comment: Performed at [facility], [address] [place], [place] [address]  Basic metabolic panel     Status: Abnormal   Collection Time: 05/09/22  6:25 AM  Result Value Ref Range   Sodium 133 (L) 135 - 145 mmol/L   Potassium 2.8 (L) 3.5 - 5.1 mmol/L   Chloride 106 98 - 111 mmol/L   CO2 18 (L) 22 - 32 mmol/L   Glucose, Bld 123 (H) 70 - 99 mg/dL    Comment: Glucose reference range applies only to samples taken after fasting for at least 8 hours.   BUN <5 (L) 6 - 20 mg/dL   Creatinine, Ser 3.78 0.44 - 1.00 mg/dL   Calcium 7.8 (L) 8.9 - 10.3 mg/dL   GFR, Estimated >58 >85 mL/min    Comment: (NOTE) Calculated using the CKD-EPI Creatinine Equation (2021)    Anion gap 9 5 - 15    Comment: Performed at [facility], [address] [place], [place] [address]  Magnesium     Status: Abnormal   Collection Time: 05/09/22  6:25 AM  Result Value Ref Range   Magnesium 1.5 (L) 1.7 - 2.4 mg/dL    Comment: Performed at [facility], [address], [place], [place] [address]  Protime-INR     Status: None   Collection Time: 05/09/22  6:25 AM  Result Value Ref Range   Prothrombin Time 14.4 11.4 - 15.2 seconds   INR 1.1 0.8 - 1.2    Comment: (NOTE) INR goal varies based on device and disease states. Performed at [facility], [address], [place], [place] [address]   APTT     Status: None  Collection Time: 05/09/22  6:25 AM  Result Value Ref Range   aPTT 34 24 - 36 seconds    Comment: Performed at [facility], [address] [place], [place] [address]  Fibrinogen     Status: Abnormal   Collection Time: 05/09/22  6:25 AM  Result Value Ref Range   Fibrinogen 574 (H) 210 - 475 mg/dL    Comment: (NOTE) Fibrinogen results may be underestimated in patients receiving thrombolytic therapy. Performed at [facility], [address] [place], [place] [address]   Prepare RBC (crossmatch)      Status: None   Collection Time: 05/09/22  1:08 PM  Result Value Ref Range   Order Confirmation      ORDER PROCESSED BY BLOOD BANK Performed at [facility], [address] [place], [place] [address]     US MFM OB LIMITED  Result Date: 05/09/2022 ----------------------------------------------------------------------  OBSTETRICS REPORT                       (Signed Final 05/09/2022 03:28 pm) ---------------------------------------------------------------------- Patient Info  ID #:       [id number]                          D.O.B.:  26-Jan-1996 (26 yrs)  Name:       Holly Greene                  Visit Date: 05/09/2022 02:08 pm ---------------------------------------------------------------------- Performed By  Attending:        Braxton FeathersBurk Schaible DO       Ref. Address:     Faculty  Performed By:     Marcellina MillinKelly L Moser          Secondary Phy.:   [facility]                    RDMS                                                             Care  Referred By:      Gigi GinPEGGY                  [facility] ---------------------------------------------------------------------- Orders  #  Description                           Code        Ordered By  1  US MFM OB LIMITED                     [id number]    PEGGY CONSTANT ----------------------------------------------------------------------  #  Order #                     Accession #  Episode #  1  [id number]                   [id number]                 [id number] ---------------------------------------------------------------------- Indications  Vaginal bleeding in pregnancy, third trimester O46.93  [redacted] weeks gestation of pregnancy                Z3A.30 ---------------------------------------------------------------------- Fetal Evaluation  Num Of Fetuses:         1  Fetal Heart Rate(bpm):  130  Cardiac Activity:       Observed  Presentation:           Cephalic   Placenta:               Posterior  P. Cord Insertion:      Previously visualized  Amniotic Fluid  AFI FV:      Within normal limits  AFI Sum(cm)     %Tile       Largest Pocket(cm)  10              14          4.9  RUQ(cm)       RLQ(cm)       LUQ(cm)        LLQ(cm)  4.9           1.2           1.3            2.6  Comment:    No placental abruption or previa identified. ---------------------------------------------------------------------- OB History  Gravidity:    5         Term:   3        Prem:   0        SAB:   0  TOP:          1       Ectopic:  0        Living: 3 ---------------------------------------------------------------------- Gestational Age  LMP:           30w 4d        Date:  10/07/21                   EDD:   07/14/22  Clinical EDD:  27w 0d                                        EDD:   08/08/22  Best:          30w 4d     Det. By:  LMP  (10/07/21)          EDD:   07/14/22 ---------------------------------------------------------------------- Anatomy  Cranium:               Appears normal         Stomach:                Appears normal, left  sided  Ventricles:            Appears normal         Kidneys:                Appear normal  Diaphragm:             Appears normal         Bladder:                Appears normal ---------------------------------------------------------------------- Cervix Uterus Adnexa  Cervix  Not visualized (advanced GA >24wks)  Uterus  No abnormality visualized.  Right Ovary  Within normal limits.  Left Ovary  Simple cyst measuring 3.3x 3.1x 3.5cm  Adnexa  No abnormality visualized ---------------------------------------------------------------------- Comments  Hospital Ultrasound  30w 4d who presents to the MAU for flank pain and was  admitted for pyelo. She also has vaginal bleeding and passed  some small clots. EDD: 07/14/2022 by LMP  (10/07/21).  Sonographic findings  Single intrauterine pregnancy.  Fetal cardiac  activity: Observed and appears normal.  Presentation: Cephalic.  Limited fetal anatomy appears normal.  Amniotic fluid volume: Within normal limits. AFI: 10. cm.  MVP: 4.9 cm.  Placenta: Posterior. There is no sonographic evidence of  bleeding but the placenta is heavily calcified.  Appropriate movement and tone for gestational age.  Recommendations  - While there is no sonogrpahic evidence of placental  bleeding, placental abruption is a clinical diagnosis and  ultrasound findings of placental beeding are seen in less than  25% of cases  - Continue clinical management per [facility] provider  This was a limited ultrasound with a remote read. If an official  MFM consult is requested for any reason please call/place an  order in [place]. ----------------------------------------------------------------------                  Valeda Malm, DO Electronically Signed Final Report   05/09/2022 03:28 pm ----------------------------------------------------------------------  US RENAL  Result Date: 05/08/2022 CLINICAL DATA:  Acute flank pain for 7 days. Fever and chills. Thirty week pregnant patient. EXAM: RENAL / URINARY TRACT ULTRASOUND COMPLETE COMPARISON:  None Available. FINDINGS: Right Kidney: Renal measurements: 12.4 x 6.8 x 5.9 cm = volume: 260 mL. Echogenicity within normal limits. No mass or hydronephrosis visualized. Left Kidney: Renal measurements: 14.1 x 7.2 x 5.4 cm = volume: 289 mL. Echogenicity within normal limits. No mass or hydronephrosis visualized. Bladder: Appears normal for degree of bladder distention. Other: None. IMPRESSION: Normal study. No cause for the patient's symptoms identified. Electronically Signed   By: Dorise Bullion III M.D.   On: 05/08/2022 17:21    Current scheduled medications  docusate sodium  100 mg Oral Daily   enoxaparin (LOVENOX) injection  50 mg Subcutaneous Q24H   potassium chloride  40 mEq Oral BID   prenatal multivitamin  1 tablet Oral Q1200   I have reviewed the patient's  current medications.  ASSESSMENT: Principal Problem:   Pyelonephritis affecting pregnancy   PLAN: #Sepsis from urinary source/pyelo, improving  - s/p Rocephin 2g daily x 48h - Ucx > 100k cfu GNRs will transition to PO abx pending sensitivities - Will need PPX for remainder of pregnancy  #Vaginal bleeding - Started 1/30 PM - No recent intercourse or cervical exams per pt, suspect marginal abruption - Hgb 8.9, coags & fibrinogen normal 1/31 - s/p 2u pRBCs 1/31 - s/p BMZ 1/31-2/1 - s/p Mag x 12h 1/31-2/1  #Hypokalemia - Likely due to vomiting - Last 2.8, pending this AM -  replete prn   #Prior CS - CSx1, then VBAC x1 - MOD to be discussed  #FWB/Routine - Posterior placenta, growth AGA on 04/24/22 (@28 /3 1321g 60%) - Rh+, RI, RPR NR, HIV NR, HBsAg NR, A1c 4.8  - NST q shift - BMZ & Mag as above  Continue antenatal care.  , MD Obstetrician & Gynecologist, [facility]

## 2022-05-11 DIAGNOSIS — Z3A30 30 weeks gestation of pregnancy: Secondary | ICD-10-CM | POA: Diagnosis not present

## 2022-05-11 DIAGNOSIS — O2303 Infections of kidney in pregnancy, third trimester: Secondary | ICD-10-CM | POA: Diagnosis not present

## 2022-05-11 DIAGNOSIS — O4593 Premature separation of placenta, unspecified, third trimester: Secondary | ICD-10-CM | POA: Diagnosis not present

## 2022-05-11 LAB — CBC
HCT: 28.4 % — ABNORMAL LOW (ref 36.0–46.0)
Hemoglobin: 9.9 g/dL — ABNORMAL LOW (ref 12.0–15.0)
MCH: 31.9 pg (ref 26.0–34.0)
MCHC: 34.9 g/dL (ref 30.0–36.0)
MCV: 91.6 fL (ref 80.0–100.0)
Platelets: 163 10*3/uL (ref 150–400)
RBC: 3.1 MIL/uL — ABNORMAL LOW (ref 3.87–5.11)
RDW: 13.1 % (ref 11.5–15.5)
WBC: 11.4 10*3/uL — ABNORMAL HIGH (ref 4.0–10.5)
nRBC: 0 % (ref 0.0–0.2)

## 2022-05-11 LAB — BLOOD CULTURE ID PANEL (REFLEXED) - BCID2

## 2022-05-11 LAB — FIBRINOGEN: Fibrinogen: 473 mg/dL (ref 210–475)

## 2022-05-11 LAB — TYPE AND SCREEN
ABO/RH(D): O POS
Antibody Screen: NEGATIVE

## 2022-05-11 LAB — APTT: aPTT: 28 seconds (ref 24–36)

## 2022-05-11 MED ORDER — SODIUM CHLORIDE 0.9 % IV SOLN
2.0000 g | INTRAVENOUS | Status: DC
  Administered 2022-05-11 – 2022-05-12 (×2): 2 g via INTRAVENOUS
  Filled 2022-05-11 (×2): qty 20

## 2022-05-11 MED ORDER — SODIUM CHLORIDE 0.9 % IV SOLN: INTRAVENOUS | Status: DC | PRN

## 2022-05-11 MED ORDER — SODIUM CHLORIDE 0.9 % IV BOLUS
1000.0000 mL | Freq: Once | INTRAVENOUS | Status: AC
  Administered 2022-05-11: 1000 mL via INTRAVENOUS

## 2022-05-11 MED ORDER — LACTATED RINGERS IV SOLN: INTRAVENOUS | Status: DC

## 2022-05-11 NOTE — Progress Notes (Addendum)
Called to room by pt, reported large "gush" of blood when attempting to use the bathroom. Pt was assisted back to bed and EFM applied. Noted approx. 10cm circle of blood on bedpad, and large amount of blood in hat on toilet, as well as blood in toilet; pt reported she had been unable to void so all fluid in hat was blood. Measured via Triton as 235ml. Pt was dizzy and nauseated upon return to bed; VSS. IVF bolus initiated, second IV site obtained, and labs ordered. Ervin MD was present in room and aware of bleeding and symptoms and reviewed EFM strip. Pt made NPO per MD.

## 2022-05-11 NOTE — Progress Notes (Signed)
PHARMACY - PHYSICIAN COMMUNICATION CRITICAL VALUE ALERT - BLOOD CULTURE IDENTIFICATION (BCID)  Holly Greene is an 27 y.o. [id number] at [redacted]w[redacted]d female who presented to [facility]20[facility] with pyelonephritis now with positive blood culture.  Assessment:  urine culture positive for E coli (pan sens). Afebrile, vitals stable WBC count trending up to 11.4  Name of physician (or Provider) Contacted: Dr. Rip Harbour  Current antibiotics: Augmentin  Changes to prescribed antibiotics recommended:  Switched antibiotics back to ceftriaxone  (received ceftriaxone from 1/30-2/1) Repeating blood cultures today Will follow sensitivities on [place] and repeat culture results  Results for orders placed or performed during the hospital encounter of 05/08/22  Blood Culture ID Panel (Reflexed) (Collected: 05/08/2022 11:16 AM)  Result Value Ref Range   Enterococcus faecalis NOT DETECTED NOT DETECTED   Enterococcus Faecium NOT DETECTED NOT DETECTED   Listeria monocytogenes NOT DETECTED NOT DETECTED   Staphylococcus species NOT DETECTED NOT DETECTED   Staphylococcus aureus (BCID) NOT DETECTED NOT DETECTED   Staphylococcus epidermidis NOT DETECTED NOT DETECTED   Staphylococcus lugdunensis NOT DETECTED NOT DETECTED   Streptococcus species NOT DETECTED NOT DETECTED   Streptococcus agalactiae NOT DETECTED NOT DETECTED   Streptococcus pneumoniae NOT DETECTED NOT DETECTED   Streptococcus pyogenes NOT DETECTED NOT DETECTED   A.calcoaceticus-baumannii NOT DETECTED NOT DETECTED   Bacteroides fragilis NOT DETECTED NOT DETECTED   Enterobacterales DETECTED (A) NOT DETECTED   Enterobacter cloacae complex NOT DETECTED NOT DETECTED   Escherichia coli DETECTED (A) NOT DETECTED   Klebsiella aerogenes NOT DETECTED NOT DETECTED   Klebsiella oxytoca NOT DETECTED NOT DETECTED   Klebsiella pneumoniae NOT DETECTED NOT DETECTED   Proteus species NOT DETECTED NOT DETECTED   Salmonella species NOT DETECTED NOT DETECTED   Serratia  marcescens NOT DETECTED NOT DETECTED   Haemophilus influenzae NOT DETECTED NOT DETECTED   Neisseria meningitidis NOT DETECTED NOT DETECTED   Pseudomonas aeruginosa NOT DETECTED NOT DETECTED   Stenotrophomonas maltophilia NOT DETECTED NOT DETECTED   Candida albicans NOT DETECTED NOT DETECTED   Candida auris NOT DETECTED NOT DETECTED   Candida glabrata NOT DETECTED NOT DETECTED   Candida krusei NOT DETECTED NOT DETECTED   Candida parapsilosis NOT DETECTED NOT DETECTED   Candida tropicalis NOT DETECTED NOT DETECTED   Cryptococcus neoformans/gattii NOT DETECTED NOT DETECTED   CTX-M ESBL NOT DETECTED NOT DETECTED   Carbapenem resistance IMP NOT DETECTED NOT DETECTED   Carbapenem resistance KPC NOT DETECTED NOT DETECTED   Carbapenem resistance NDM NOT DETECTED NOT DETECTED   Carbapenem resist OXA 48 LIKE NOT DETECTED NOT DETECTED   Carbapenem resistance VIM NOT DETECTED NOT DETECTED    Juanell Fairly, PharmD, BCPPS 05/11/2022 12:56 PM

## 2022-05-11 NOTE — Progress Notes (Signed)
Pt feels better. No more vaginal bleeding or cramping FHT's reactive, no ut ctx Will d/c continue monitoring and return to NST bid Full liquids  Pt informed of + BC ( e.coli) Augmentin d/c and pt restarted on Rocephin. Repeat blood cultures x 2 obtained. Will continue with IV Rocephin until [facility] return as negative Total 14 days of antibiotic therapy  Pt verbalized understanding.

## 2022-05-11 NOTE — Progress Notes (Addendum)
[facility] PROGRESS NOTE  Holly Greene is a 27 y.o. [phone] at [redacted]w[redacted]d who is admitted for suspected pyelonephritis, now with vaginal bleeding concerning for marginal abruption.  Estimated Date of Delivery: 07/14/22 Fetal presentation is cephalic.  Length of Stay:  3 Days. Admitted 05/08/2022  Subjective: No bleeding for 24h. Denies abdominal pain, LOF. +FM. Reports that she needs to go to home today due to childcare issues.  Vitals:  Blood pressure 116/65, pulse 86, temperature 97.8 F (36.6 C), temperature source Oral, resp. rate 16, height 5\' 6"  (1.676 m), weight 94.2 kg, last menstrual period 10/07/2021, SpO2 99 %, unknown if currently breastfeeding. Physical Examination: CONSTITUTIONAL: Well-developed, well-nourished female in no acute distress.  NEUROLOGIC: Alert and oriented to person, place, and time. No cranial nerve deficit noted. PSYCHIATRIC: Normal mood and affect. Normal behavior. Normal judgment and thought content. CARDIOVASCULAR: Normal heart rate noted, regular rhythm RESPIRATORY: Effort and breath sounds normal, no problems with respiration noted MUSCULOSKELETAL: Normal range of motion. No edema and no tenderness. 2+ distal pulses. ABDOMEN: Soft, nontender, nondistended, gravid. PELVIC: From 2/1: Performed in presence of chaperone. SSE - cervix visually 1cm dilated with dark old blood at the os and in the vaginal vault (<5cc), no active bleeding  Fetal monitoring: Overnight - FHR: 135 bpm, Variability: moderate, Accelerations: Present (15x15), Decelerations: 1 late deceleration Uterine activity: Rare ctx Planned for repeat NST this AM  Results for orders placed or performed during the hospital encounter of 05/08/22 (from the past 48 hour(s))  Prepare RBC (crossmatch)     Status: None   Collection Time: 05/09/22  1:08 PM  Result Value Ref Range   Order Confirmation      ORDER PROCESSED BY BLOOD BANK Performed at [facility],  [place] [address], [place], [place] [address]   CBC with Differential/Platelet     Status: Abnormal   Collection Time: 05/10/22  9:34 AM  Result Value Ref Range   WBC 11.5 (H) 4.0 - 10.5 K/uL   RBC 3.32 (L) 3.87 - 5.11 MIL/uL   Hemoglobin 10.7 (L) 12.0 - 15.0 g/dL   HCT 29.7 (L) 36.0 - 46.0 %   MCV 89.5 80.0 - 100.0 fL   MCH 32.2 26.0 - 34.0 pg   MCHC 36.0 30.0 - 36.0 g/dL   RDW 12.8 11.5 - 15.5 %   Platelets 150 150 - 400 K/uL   nRBC 0.0 0.0 - 0.2 %   Neutrophils Relative % 85 %   Neutro Abs 9.7 (H) 1.7 - 7.7 K/uL   Lymphocytes Relative 7 %   Lymphs Abs 0.8 0.7 - 4.0 K/uL   Monocytes Relative 6 %   Monocytes Absolute 0.7 0.1 - 1.0 K/uL   Eosinophils Relative 0 %   Eosinophils Absolute 0.0 0.0 - 0.5 K/uL   Basophils Relative 0 %   Basophils Absolute 0.0 0.0 - 0.1 K/uL   Immature Granulocytes 2 %   Abs Immature Granulocytes 0.24 (H) 0.00 - 0.07 K/uL    Comment: Performed at [facility], [place] [address], [place], [place] [address]  Magnesium     Status: Abnormal   Collection Time: 05/10/22  9:34 AM  Result Value Ref Range   Magnesium 3.1 (H) 1.7 - 2.4 mg/dL    Comment: Performed at [facility] [address] [place], [place] [address]  Basic metabolic panel     Status: Abnormal   Collection Time: 05/10/22  9:34 AM  Result Value Ref Range  Sodium 136 135 - 145 mmol/L   Potassium 3.6 3.5 - 5.1 mmol/L    Comment: DELTA CHECK NOTED   Chloride 109 98 - 111 mmol/L   CO2 20 (L) 22 - 32 mmol/L   Glucose, Bld 113 (H) 70 - 99 mg/dL    Comment: Glucose reference range applies only to samples taken after fasting for at least 8 hours.   BUN <5 (L) 6 - 20 mg/dL   Creatinine, Ser 7.10 0.44 - 1.00 mg/dL   Calcium 7.3 (L) 8.9 - 10.3 mg/dL   GFR, Estimated >62 >69 mL/min    Comment: (NOTE) Calculated using the CKD-EPI Creatinine Equation (2021)    Anion gap 7 5 - 15    Comment: Performed at [facility], [address], [place], [place] [address]    [place] MFM OB  LIMITED  Result Date: 05/09/2022 ----------------------------------------------------------------------  OBSTETRICS REPORT                       (Signed Final 05/09/2022 03:28 pm) ---------------------------------------------------------------------- Patient Info  ID #:       [id number]                          D.O.B.:  August 04, 1995 (26 yrs)  Name:       Holly Greene                  Visit Date: 05/09/2022 02:08 pm ---------------------------------------------------------------------- Performed By  Attending:        Braxton Feathers DO       Ref. Address:     Faculty  Performed By:     Marcellina Millin          Secondary Phy.:   [facility]                    RDMS                                                             Care  Referred By:      Gigi Gin                  [facility] ---------------------------------------------------------------------- Orders  #  Description                           Code        Ordered By  1  [place] MFM OB LIMITED                     81829.93    PEGGY CONSTANT ----------------------------------------------------------------------  #  Order #                     Accession #                Episode #  1  [id number]                   [id number]                 [id number] ---------------------------------------------------------------------- Indications  Vaginal bleeding in pregnancy, third trimester O46.93  [redacted] weeks gestation of pregnancy                Z3A.30 ---------------------------------------------------------------------- Fetal Evaluation  Num Of Fetuses:         1  Fetal Heart Rate(bpm):  130  Cardiac Activity:       Observed  Presentation:           Cephalic  Placenta:               Posterior  P. Cord Insertion:      Previously visualized  Amniotic Fluid  AFI FV:      Within normal limits  AFI Sum(cm)     %Tile       Largest Pocket(cm)  10              14          4.9  RUQ(cm)       RLQ(cm)        LUQ(cm)        LLQ(cm)  4.9           1.2           1.3            2.6  Comment:    No placental abruption or previa identified. ---------------------------------------------------------------------- OB History  Gravidity:    5         Term:   3        Prem:   0        SAB:   0  TOP:          1       Ectopic:  0        Living: 3 ---------------------------------------------------------------------- Gestational Age  LMP:           30w 4d        Date:  10/07/21                   EDD:   07/14/22  Clinical EDD:  27w 0d                                        EDD:   08/08/22  Best:          30w 4d     Det. By:  LMP  (10/07/21)          EDD:   07/14/22 ---------------------------------------------------------------------- Anatomy  Cranium:               Appears normal         Stomach:                Appears normal, left                                                                        sided  Ventricles:  Appears normal         Kidneys:                Appear normal  Diaphragm:             Appears normal         Bladder:                Appears normal ---------------------------------------------------------------------- Cervix Uterus Adnexa  Cervix  Not visualized (advanced GA >24wks)  Uterus  No abnormality visualized.  Right Ovary  Within normal limits.  Left Ovary  Simple cyst measuring 3.3x 3.1x 3.5cm  Adnexa  No abnormality visualized ---------------------------------------------------------------------- Comments  Hospital Ultrasound  30w 4d who presents to the MAU for flank pain and was  admitted for pyelo. She also has vaginal bleeding and passed  some small clots. EDD: 07/14/2022 by LMP  (10/07/21).  Sonographic findings  Single intrauterine pregnancy.  Fetal cardiac activity: Observed and appears normal.  Presentation: Cephalic.  Limited fetal anatomy appears normal.  Amniotic fluid volume: Within normal limits. AFI: 10. cm.  MVP: 4.9 cm.  Placenta: Posterior. There is no sonographic evidence of  bleeding  but the placenta is heavily calcified.  Appropriate movement and tone for gestational age.  Recommendations  - While there is no sonogrpahic evidence of placental  bleeding, placental abruption is a clinical diagnosis and  ultrasound findings of placental beeding are seen in less than  25% of cases  - Continue clinical management per [facility] provider  This was a limited ultrasound with a remote read. If an official  MFM consult is requested for any reason please call/place an  order in Epic. ----------------------------------------------------------------------                  Valeda Malm, DO Electronically Signed Final Report   05/09/2022 03:28 pm ----------------------------------------------------------------------   Current scheduled medications  amoxicillin-clavulanate  1 tablet Oral Q12H   docusate sodium  100 mg Oral Daily   enoxaparin (LOVENOX) injection  50 mg Subcutaneous Q24H   potassium chloride  40 mEq Oral BID   prenatal multivitamin  1 tablet Oral Q1200   I have reviewed the patient's current medications.  ASSESSMENT: Principal Problem:   Pyelonephritis affecting pregnancy  PLAN: #Sepsis from urinary source/pyelo, improving  - s/p Rocephin 2g daily x 48h, transitioned to augmentin 2/1 PM - Ucx > 100k cfu pansensitive E coli - Will need PPX for remainder of pregnancy  #Vaginal bleeding - Started 1/30 PM - No recent intercourse or cervical exams per pt, suspect marginal abruption - Hgb 8.9, coags & fibrinogen normal 1/31 - s/p 2u pRBCs 1/31 - s/p BMZ 1/31-2/1 - s/p Mag x 12h 1/31-2/1  #Hypokalemia, resolved - s/p repletion   #Prior CS - CSx1, then VBAC x1 - MOD to be discussed  #FWB/Routine - Posterior placenta, growth AGA on 04/24/22 (@28 /3 1321g 60%) - Rh+, RI, RPR NR, HIV NR, HBsAg NR, A1c 4.8  - NST q shift - BMZ & Mag as above  Continue antenatal care.  Gale Journey, MD [place], [facility] for [employer], [place]

## 2022-05-11 NOTE — Progress Notes (Signed)
CTSP by nursing due to vaginal bleeding  Pt was up restroom and noted heavy vaginal bleeding Has felt some cramps but no clear ut ctx. Feels faint + fetal movement FHT's 120's No ut ctx Will check labs, fluid bolus, NPO and continuous monitoring. POC discussed with pt Follow closely

## 2022-05-12 DIAGNOSIS — O2303 Infections of kidney in pregnancy, third trimester: Secondary | ICD-10-CM | POA: Diagnosis not present

## 2022-05-12 DIAGNOSIS — Z3A31 31 weeks gestation of pregnancy: Secondary | ICD-10-CM

## 2022-05-12 NOTE — Progress Notes (Signed)
[facility] NOTE  Holly Greene is a 27 y.o. [id number] at [redacted]w[redacted]d  who is admitted for pyelonephritis and develpoped vaginal bleeding during admission.   Fetal presentation is cephalic. Length of Stay:  4  Days  Subjective: Events of yesterday noted. This morning reports being tried. No further bleeding Patient reports good fetal movement.  She reports no uterine contractions, no bleeding and no loss of fluid per vagina.  Vitals:  Blood pressure 117/78, pulse 73, temperature 97.9 F (36.6 C), temperature source Oral, resp. rate 18, height 5\' 6"  (1.676 m), weight 94.2 kg, last menstrual period 10/07/2021, SpO2 98 %, unknown if currently breastfeeding.  Physical Examination: Lungs clear Heart RRR Back no CVA tenderness GU no bleeding Ext non tender  Fetal Monitoring:  Baseline: 130's bpm, Variability: Good {> 6 bpm), Accelerations: Reactive, and Decelerations: Absent  Labs:  Results for orders placed or performed during the hospital encounter of 05/08/22 (from the past 24 hour(s))  Type and screen [place]   Collection Time: 05/11/22 11:14 AM  Result Value Ref Range   ABO/RH(D) O POS    Antibody Screen NEG    Sample Expiration      05/14/2022,2359 Performed at [facility], [place] [address] [place], [place] [address]   Fibrinogen   Collection Time: 05/11/22 11:15 AM  Result Value Ref Range   Fibrinogen 473 210 - 475 mg/dL  APTT   Collection Time: 05/11/22 11:15 AM  Result Value Ref Range   aPTT 28 24 - 36 seconds  CBC   Collection Time: 05/11/22 11:28 AM  Result Value Ref Range   WBC 11.4 (H) 4.0 - 10.5 K/uL   RBC 3.10 (L) 3.87 - 5.11 MIL/uL   Hemoglobin 9.9 (L) 12.0 - 15.0 g/dL   HCT 28.4 (L) 36.0 - 46.0 %   MCV 91.6 80.0 - 100.0 fL   MCH 31.9 26.0 - 34.0 pg   MCHC 34.9 30.0 - 36.0 g/dL   RDW 13.1 11.5 - 15.5 %   Platelets 163 150 - 400 K/uL   nRBC 0.0 0.0 - 0.2 %  Culture, blood (Routine X 2) w Reflex to  ID Panel   Collection Time: 05/11/22  1:12 PM   Specimen: BLOOD  Result Value Ref Range   Specimen Description BLOOD LEFT ANTECUBITAL    Special Requests      BOTTLES DRAWN AEROBIC AND ANAEROBIC Blood Culture results may not be optimal due to an inadequate volume of blood received in culture bottles   Culture      NO GROWTH < 24 HOURS Performed at [facility], [place] [address], [place], [place] [address]    Report Status PENDING   Culture, blood (Routine X 2) w Reflex to ID Panel   Collection Time: 05/11/22  1:43 PM   Specimen: BLOOD LEFT HAND  Result Value Ref Range   Specimen Description BLOOD LEFT HAND    Special Requests      BOTTLES DRAWN AEROBIC AND ANAEROBIC Blood Culture results may not be optimal due to an inadequate volume of blood received in culture bottles   Culture      NO GROWTH < 24 HOURS Performed at [facility] [address], [place], [place] [address]    Report Status PENDING     Imaging Studies:    NA   Medications:  Scheduled  docusate sodium  100 mg Oral Daily   enoxaparin (LOVENOX) injection  50 mg  Subcutaneous Q24H   prenatal multivitamin  1 tablet Oral Q1200   I have reviewed the patient's current medications.  ASSESSMENT: IUP 31 0/7 weeks Pyelonephritis Vaginal bleeding   PLAN: Stable. Blood and urine culture + (e. Coli), pansensative. Continue with Rocephin. Repeat BC drawn 05/11/22. IV antibiotics until [facility] negative and then switch to oral antibiotics for a total of 14 day treatment.  S/P Magnesium and BMZ.  Continue wit in house observation for 7 days no bleeding Fetal well being is reassuring. Continue with daily monitoring. Continue routine antenatal care.   Chancy Milroy 05/12/2022,10:05 AM

## 2022-05-13 DIAGNOSIS — O2303 Infections of kidney in pregnancy, third trimester: Secondary | ICD-10-CM | POA: Diagnosis not present

## 2022-05-13 DIAGNOSIS — Z3A31 31 weeks gestation of pregnancy: Secondary | ICD-10-CM | POA: Diagnosis not present

## 2022-05-13 LAB — CULTURE, BLOOD (ROUTINE X 2)
Culture: NO GROWTH
Special Requests: ADEQUATE
Special Requests: ADEQUATE

## 2022-05-13 MED ORDER — AMOXICILLIN-POT CLAVULANATE 875-125 MG PO TABS
1.0000 | ORAL_TABLET | Freq: Two times a day (BID) | ORAL | Status: DC
  Administered 2022-05-13 – 2022-05-14 (×3): 1 via ORAL
  Filled 2022-05-13 (×4): qty 1

## 2022-05-13 NOTE — Progress Notes (Signed)
Holly Greene NOTE  Holly Greene is a 27 y.o. [id number] at [redacted]w[redacted]d  who is admitted for pyelonephritis and developed vaginal bleeding during admission.   Fetal presentation is cephalic. Length of Stay:  5  Days  Subjective: Pt has no complaints this morning. Patient reports good fetal movement.  She reports no uterine contractions, no bleeding and no loss of fluid per vagina.  Vitals:  Blood pressure (!) 99/58, pulse 70, temperature 98 F (36.7 C), temperature source Oral, resp. rate 16, height 5\' 6"  (1.676 m), weight 94.2 kg, last menstrual period 10/07/2021, SpO2 96 %, unknown if currently breastfeeding.  Physical Examination: Lungs clear Heart RRR Abd soft + BS gravid Ext non tender  Fetal Monitoring:  Baseline: 130 bpm, Variability: Good {> 6 bpm), Accelerations: Reactive, and Decelerations: Absent  Labs:  No results found for this or any previous visit (from the past 24 hour(s)).  Imaging Studies:    NA   Medications:  Scheduled  docusate sodium  100 mg Oral Daily   enoxaparin (LOVENOX) injection  50 mg Subcutaneous Q24H   prenatal multivitamin  1 tablet Oral Q1200   I have reviewed the patient's current medications.  ASSESSMENT: IUP 31 1/7 weeks Pyelonephritis Vaginal bleeding, last bleeding 05/12/22  PLAN: Stable. BC and UC, + E.coli. Repeat BC negative. Will d/c IV antibiotics and start po antibiotics  for total of 14 days. (4/14) Suppression therapy after 14 days.  S/P Magnesium and BMZ. Continue in house observation for 7 days without bleeding Fetal well being reassuring. Continue routine antenatal care.   Chancy Milroy 05/13/2022,10:31 AM

## 2022-05-14 DIAGNOSIS — O2303 Infections of kidney in pregnancy, third trimester: Secondary | ICD-10-CM | POA: Diagnosis not present

## 2022-05-14 DIAGNOSIS — Z3A30 30 weeks gestation of pregnancy: Secondary | ICD-10-CM | POA: Diagnosis not present

## 2022-05-14 DIAGNOSIS — O0930 Supervision of pregnancy with insufficient antenatal care, unspecified trimester: Secondary | ICD-10-CM

## 2022-05-14 DIAGNOSIS — O4693 Antepartum hemorrhage, unspecified, third trimester: Secondary | ICD-10-CM | POA: Diagnosis present

## 2022-05-14 LAB — CBC
HCT: 26.9 % — ABNORMAL LOW (ref 36.0–46.0)
Hemoglobin: 9.3 g/dL — ABNORMAL LOW (ref 12.0–15.0)
MCH: 31.3 pg (ref 26.0–34.0)
MCHC: 34.6 g/dL (ref 30.0–36.0)
MCV: 90.6 fL (ref 80.0–100.0)
Platelets: 215 10*3/uL (ref 150–400)
RBC: 2.97 MIL/uL — ABNORMAL LOW (ref 3.87–5.11)
RDW: 12.2 % (ref 11.5–15.5)
WBC: 8.8 10*3/uL (ref 4.0–10.5)
nRBC: 0 % (ref 0.0–0.2)

## 2022-05-14 LAB — TYPE AND SCREEN
ABO/RH(D): O POS
Antibody Screen: NEGATIVE

## 2022-05-14 MED ORDER — AMOXICILLIN-POT CLAVULANATE 875-125 MG PO TABS: 1.0000 | ORAL_TABLET | Freq: Two times a day (BID) | ORAL | 1 refills | Status: DC

## 2022-05-14 MED ORDER — FERROUS SULFATE 325 (65 FE) MG PO TABS: 325.0000 mg | ORAL_TABLET | ORAL | 3 refills | Status: DC

## 2022-05-14 MED ORDER — CEPHALEXIN 500 MG PO CAPS: 500.0000 mg | ORAL_CAPSULE | Freq: Every day | ORAL | 2 refills | Status: DC

## 2022-05-14 NOTE — Discharge Summary (Signed)
Antenatal Physician Discharge Summary - Patient left against medical advice  Patient ID: Holly Greene MRN: [id number] DOB/AGE: 1995/04/25 27 y.o.  Admit date: 05/08/2022 Discharge date: 05/14/2022  Admission Diagnoses: Principal Problem:   Pyelonephritis affecting pregnancy in third trimester Active Problems:   No prenatal care in current pregnancy  Discharge Diagnoses:  Principal Problem:   Pyelonephritis affecting pregnancy in third trimester Active Problems:   Vaginal bleeding in pregnancy, third trimester   No prenatal care in current pregnancy  Prenatal Procedures: NST and ultrasound  Consults: None  Hospital Course:  Opha Rames is a 27 y.o. [id number] with IUP at [redacted]w[redacted]d admitted for acute pyelonephritis and then developed vaginal bleeding on HD #1 evening concerning for marginal abruption.   #Pyelonephritis:  She was started on IV Rocephin, also received IV hydration. Her urine and blood cultures were positive for E.coli.  Patient was afebrile for her entire hospitalization and her pyelonephritis symptoms improved significantly with oral analgesia.   Her antibiotic regimen was switched to oral Augmentin on HD#5, after her repeat blood cultures were negative.  She tolerated this change well and will need UTI suppression antibiotics (Keflex 500 mg po qd) for the rest of her pregnancy after Augmentin course.  #Vaginal bleeding: Reported on HD#1 evening, patient passed a few clots.  Sterile speculum exam  performed - cervix visually 1cm dilated with dark old blood at os, no active bleeding.  She had another episode of heavy bleeding on 05/09/22, and was started on magnesium sulfate for fetal neuroprotection given second episode of vaginal bleeding in less than 24 hours. She also received two units of pRBCs on 1/31 and betamethasone regimen was given 1/31-2/1.  Reassuring fetal well-being.  Ultrasound was unremarkable,  and she was monitored closely. She had another bleeding episode on  05/11/22.  FHR tracing remained reassuring during these episodes.  Hemoglobin remained stable around 9 g/dL. No further bleeding was reported, the plan was to observe her and consider discharge if she has seven days without bleeding. On 05/14/22, patient reported having childcare issues and other familial issues and desired to leave. She was counseled this was against medical advice as she has a increased risk of rebleeding and having complications.  Patient verbalized understanding of the increased maternal-fetal morbidity and mortality associated with bleeding in pregnancy, but still signed AMA papers. Bleeding, fetal movement and preterm precautions reviewed with patient.  She has an appointment on 05/22/22 for a new OB patient intake, and was told to make this appointment but to come back to [facility] earlier if any concerns.  The prescriptions for Augmentin and Keflex were sent to her pharmacy.    Discharge Exam: Temp:  [97.9 F (36.6 C)-98.3 F (36.8 C)] 98 F (36.7 C) (02/05 1603) Pulse Rate:  [68-93] 88 (02/05 1603) Resp:  [16-18] 18 (02/05 1603) BP: (107-131)/(55-84) 131/84 (02/05 1603) SpO2:  [98 %-99 %] 98 % (02/05 1603) Physical Examination: Deferred as patient left AMA  Fetal monitoring: FHR: 145 bpm, Variability: moderate, Accelerations: Present, Decelerations: none Uterine activity: Rare contractions   Significant Diagnostic Studies:  Results for orders placed or performed during the hospital encounter of 05/08/22 (from the past 168 hour(s))  Culture, OB Urine   Collection Time: 05/08/22 11:00 AM   Specimen: Urine, Random  Result Value Ref Range   Specimen Description URINE, RANDOM    Special Requests NONE    Culture (A)     >=100,000 COLONIES/mL ESCHERICHIA COLI NO GROUP B STREP (S.AGALACTIAE) ISOLATED Performed at [facility]  Lab, [address] [place], [place] [address]    Report Status 05/10/2022 FINAL    Organism ID, Bacteria ESCHERICHIA COLI (A)       Susceptibility    Escherichia coli - MIC*    AMPICILLIN <=2 SENSITIVE Sensitive     CEFAZOLIN <=4 SENSITIVE Sensitive     CEFEPIME <=0.12 SENSITIVE Sensitive     CEFTRIAXONE <=0.25 SENSITIVE Sensitive     CIPROFLOXACIN <=0.25 SENSITIVE Sensitive     GENTAMICIN <=1 SENSITIVE Sensitive     IMIPENEM <=0.25 SENSITIVE Sensitive     NITROFURANTOIN <=16 SENSITIVE Sensitive     TRIMETH/SULFA <=20 SENSITIVE Sensitive     AMPICILLIN/SULBACTAM <=2 SENSITIVE Sensitive     PIP/TAZO <=4 SENSITIVE Sensitive     * >=100,000 COLONIES/mL ESCHERICHIA COLI  Type and screen [facility]   Collection Time: 05/08/22 11:14 AM  Result Value Ref Range   ABO/RH(D) O POS    Antibody Screen NEG    Sample Expiration 05/11/2022,2359    Unit Number [id number]    Blood Component Type RED CELLS,LR    Unit division 00    Status of Unit ISSUED,FINAL    Transfusion Status OK TO TRANSFUSE    Crossmatch Result      Compatible Performed at [facility], [address] [place], [place] [address]    Unit Number [id number]    Blood Component Type RED CELLS,LR    Unit division 00    Status of Unit ISSUED,FINAL    Transfusion Status OK TO TRANSFUSE    Crossmatch Result Compatible   BPAM RBC   Collection Time: 05/08/22 11:14 AM  Result Value Ref Range   ISSUE DATE / TIME 244010272536    Blood Product Unit Number [id number]    PRODUCT CODE [id number]    Unit Type and Rh 5100    Blood Product Expiration Date [id number]    ISSUE DATE / TIME 301601093235    Blood Product Unit Number [id number]    PRODUCT CODE [id number]    Unit Type and Rh 5100    Blood Product Expiration Date [id number]   Culture, blood (Routine X 2) w Reflex to ID Panel   Collection Time: 05/08/22 11:15 AM   Specimen: BLOOD  Result Value Ref Range   Specimen Description BLOOD BLOOD LEFT HAND    Special Requests      BOTTLES DRAWN AEROBIC AND ANAEROBIC Blood Culture adequate volume   Culture      NO GROWTH 5 DAYS Performed  at [facility], [address] [place], [place] [address]    Report Status 05/13/2022 FINAL   Urinalysis, Routine w reflex microscopic -Urine, Clean Catch   Collection Time: 05/08/22 11:15 AM  Result Value Ref Range   Color, Urine AMBER (A) YELLOW   APPearance CLOUDY (A) CLEAR   Specific Gravity, Urine 1.014 1.005 - 1.030   pH 6.0 5.0 - 8.0   Glucose, UA NEGATIVE NEGATIVE mg/dL   Hgb urine dipstick NEGATIVE NEGATIVE   Bilirubin Urine NEGATIVE NEGATIVE   Ketones, ur 80 (A) NEGATIVE mg/dL   Protein, ur 854 (A) NEGATIVE mg/dL   Nitrite NEGATIVE NEGATIVE   Leukocytes,Ua LARGE (A) NEGATIVE   RBC / HPF 6-10 0 - 5 RBC/hpf   WBC, UA >50 0 - 5 WBC/hpf   Bacteria, UA MANY (A) NONE SEEN   Squamous Epithelial / HPF 0-5 0 - 5 /HPF   WBC Clumps PRESENT    Mucus PRESENT   CBC  Collection Time: 05/08/22 11:15 AM  Result Value Ref Range   WBC 10.5 4.0 - 10.5 K/uL   RBC 3.09 (L) 3.87 - 5.11 MIL/uL   Hemoglobin 9.7 (L) 12.0 - 15.0 g/dL   HCT 28.3 (L) 36.0 - 46.0 %   MCV 91.6 80.0 - 100.0 fL   MCH 31.4 26.0 - 34.0 pg   MCHC 34.3 30.0 - 36.0 g/dL   RDW 12.0 11.5 - 15.5 %   Platelets 161 150 - 400 K/uL   nRBC 0.0 0.0 - 0.2 %  Comprehensive metabolic panel   Collection Time: 05/08/22 11:15 AM  Result Value Ref Range   Sodium 131 (L) 135 - 145 mmol/L   Potassium 2.6 (LL) 3.5 - 5.1 mmol/L   Chloride 100 98 - 111 mmol/L   CO2 19 (L) 22 - 32 mmol/L   Glucose, Bld 109 (H) 70 - 99 mg/dL   BUN <5 (L) 6 - 20 mg/dL   Creatinine, Ser 0.77 0.44 - 1.00 mg/dL   Calcium 8.0 (L) 8.9 - 10.3 mg/dL   Total Protein 5.7 (L) 6.5 - 8.1 g/dL   Albumin 2.5 (L) 3.5 - 5.0 g/dL   AST 34 15 - 41 U/L   ALT 8 0 - 44 U/L   Alkaline Phosphatase 86 38 - 126 U/L   Total Bilirubin 1.2 0.3 - 1.2 mg/dL   GFR, Estimated >60 >60 mL/min   Anion gap 12 5 - 15  Magnesium   Collection Time: 05/08/22 11:15 AM  Result Value Ref Range   Magnesium 1.5 (L) 1.7 - 2.4 mg/dL  Culture, blood (Routine X 2) w Reflex to ID  Panel   Collection Time: 05/08/22 11:16 AM   Specimen: BLOOD  Result Value Ref Range   Specimen Description BLOOD RIGHT ANTECUBITAL    Special Requests      BOTTLES DRAWN AEROBIC AND ANAEROBIC Blood Culture adequate volume   Culture  Setup Time      GRAM NEGATIVE RODS AEROBIC BOTTLE ONLY CRITICAL RESULT CALLED TO, READ BACK BY AND VERIFIED WITH: Carol Ada PAYTES [id number] BY JRS Performed at [facility], [address], [place], [place] [address]    Culture ESCHERICHIA COLI (A)    Report Status 05/13/2022 FINAL    Organism ID, Bacteria ESCHERICHIA COLI       Susceptibility   Escherichia coli - MIC*    AMPICILLIN <=2 SENSITIVE Sensitive     CEFAZOLIN <=4 SENSITIVE Sensitive     CEFEPIME <=0.12 SENSITIVE Sensitive     CEFTAZIDIME <=1 SENSITIVE Sensitive     CEFTRIAXONE <=0.25 SENSITIVE Sensitive     CIPROFLOXACIN <=0.25 SENSITIVE Sensitive     GENTAMICIN <=1 SENSITIVE Sensitive     IMIPENEM <=0.25 SENSITIVE Sensitive     TRIMETH/SULFA <=20 SENSITIVE Sensitive     AMPICILLIN/SULBACTAM <=2 SENSITIVE Sensitive     PIP/TAZO <=4 SENSITIVE Sensitive     * ESCHERICHIA COLI  Blood Culture ID Panel (Reflexed)   Collection Time: 05/08/22 11:16 AM  Result Value Ref Range   Enterococcus faecalis NOT DETECTED NOT DETECTED   Enterococcus Faecium NOT DETECTED NOT DETECTED   Listeria monocytogenes NOT DETECTED NOT DETECTED   Staphylococcus species NOT DETECTED NOT DETECTED   Staphylococcus aureus (BCID) NOT DETECTED NOT DETECTED   Staphylococcus epidermidis NOT DETECTED NOT DETECTED   Staphylococcus lugdunensis NOT DETECTED NOT DETECTED   Streptococcus species NOT DETECTED NOT DETECTED   Streptococcus agalactiae NOT DETECTED NOT DETECTED   Streptococcus pneumoniae NOT DETECTED NOT DETECTED  Streptococcus pyogenes NOT DETECTED NOT DETECTED   A.calcoaceticus-baumannii NOT DETECTED NOT DETECTED   Bacteroides fragilis NOT DETECTED NOT DETECTED   Enterobacterales DETECTED (A) NOT  DETECTED   Enterobacter cloacae complex NOT DETECTED NOT DETECTED   Escherichia coli DETECTED (A) NOT DETECTED   Klebsiella aerogenes NOT DETECTED NOT DETECTED   Klebsiella oxytoca NOT DETECTED NOT DETECTED   Klebsiella pneumoniae NOT DETECTED NOT DETECTED   Proteus species NOT DETECTED NOT DETECTED   Salmonella species NOT DETECTED NOT DETECTED   Serratia marcescens NOT DETECTED NOT DETECTED   Haemophilus influenzae NOT DETECTED NOT DETECTED   Neisseria meningitidis NOT DETECTED NOT DETECTED   Pseudomonas aeruginosa NOT DETECTED NOT DETECTED   Stenotrophomonas maltophilia NOT DETECTED NOT DETECTED   Candida albicans NOT DETECTED NOT DETECTED   Candida auris NOT DETECTED NOT DETECTED   Candida glabrata NOT DETECTED NOT DETECTED   Candida krusei NOT DETECTED NOT DETECTED   Candida parapsilosis NOT DETECTED NOT DETECTED   Candida tropicalis NOT DETECTED NOT DETECTED   Cryptococcus neoformans/gattii NOT DETECTED NOT DETECTED   CTX-M ESBL NOT DETECTED NOT DETECTED   Carbapenem resistance IMP NOT DETECTED NOT DETECTED   Carbapenem resistance KPC NOT DETECTED NOT DETECTED   Carbapenem resistance NDM NOT DETECTED NOT DETECTED   Carbapenem resist OXA 48 LIKE NOT DETECTED NOT DETECTED   Carbapenem resistance VIM NOT DETECTED NOT DETECTED  Lactic acid, plasma   Collection Time: 05/08/22 11:16 AM  Result Value Ref Range   Lactic Acid, Venous 1.1 0.5 - 1.9 mmol/L  Comprehensive metabolic panel   Collection Time: 05/09/22  5:30 AM  Result Value Ref Range   Sodium 134 (L) 135 - 145 mmol/L   Potassium 3.1 (L) 3.5 - 5.1 mmol/L   Chloride 107 98 - 111 mmol/L   CO2 20 (L) 22 - 32 mmol/L   Glucose, Bld 103 (H) 70 - 99 mg/dL   BUN <5 (L) 6 - 20 mg/dL   Creatinine, Ser 0.73 0.44 - 1.00 mg/dL   Calcium 7.7 (L) 8.9 - 10.3 mg/dL   Total Protein 4.9 (L) 6.5 - 8.1 g/dL   Albumin 2.0 (L) 3.5 - 5.0 g/dL   AST 28 15 - 41 U/L   ALT 11 0 - 44 U/L   Alkaline Phosphatase 86 38 - 126 U/L   Total  Bilirubin 1.7 (H) 0.3 - 1.2 mg/dL   GFR, Estimated >60 >60 mL/min   Anion gap 7 5 - 15  Hepatitis B surface antigen   Collection Time: 05/09/22  5:30 AM  Result Value Ref Range   Hepatitis B Surface Ag NON REACTIVE NON REACTIVE  Rubella screen   Collection Time: 05/09/22  5:30 AM  Result Value Ref Range   Rubella 1.66 Immune >0.99 index  RPR   Collection Time: 05/09/22  5:30 AM  Result Value Ref Range   RPR Ser Ql NON REACTIVE NON REACTIVE  Rapid HIV screen (HIV 1/2 Ab+Ag)   Collection Time: 05/09/22  5:30 AM  Result Value Ref Range   HIV-1 P24 Antigen - HIV24 NON REACTIVE NON REACTIVE   HIV 1/2 Antibodies NON REACTIVE NON REACTIVE   Interpretation (HIV Ag Ab)      A non reactive test result means that HIV 1 or HIV 2 antibodies and HIV 1 p24 antigen were not detected in the specimen.  Hemoglobin A1c   Collection Time: 05/09/22  5:30 AM  Result Value Ref Range   Hgb A1c MFr Bld 4.8 4.8 - 5.6 %  Mean Plasma Glucose 91.06 mg/dL  CBC   Collection Time: 05/09/22  6:25 AM  Result Value Ref Range   WBC 9.7 4.0 - 10.5 K/uL   RBC 2.75 (L) 3.87 - 5.11 MIL/uL   Hemoglobin 8.9 (L) 12.0 - 15.0 g/dL   HCT 25.1 (L) 36.0 - 46.0 %   MCV 91.3 80.0 - 100.0 fL   MCH 32.4 26.0 - 34.0 pg   MCHC 35.5 30.0 - 36.0 g/dL   RDW 12.4 11.5 - 15.5 %   Platelets 141 (L) 150 - 400 K/uL   nRBC 0.0 0.0 - 0.2 %  Basic metabolic panel   Collection Time: 05/09/22  6:25 AM  Result Value Ref Range   Sodium 133 (L) 135 - 145 mmol/L   Potassium 2.8 (L) 3.5 - 5.1 mmol/L   Chloride 106 98 - 111 mmol/L   CO2 18 (L) 22 - 32 mmol/L   Glucose, Bld 123 (H) 70 - 99 mg/dL   BUN <5 (L) 6 - 20 mg/dL   Creatinine, Ser 0.76 0.44 - 1.00 mg/dL   Calcium 7.8 (L) 8.9 - 10.3 mg/dL   GFR, Estimated >60 >60 mL/min   Anion gap 9 5 - 15  Magnesium   Collection Time: 05/09/22  6:25 AM  Result Value Ref Range   Magnesium 1.5 (L) 1.7 - 2.4 mg/dL  Protime-INR   Collection Time: 05/09/22  6:25 AM  Result Value Ref Range    Prothrombin Time 14.4 11.4 - 15.2 seconds   INR 1.1 0.8 - 1.2  APTT   Collection Time: 05/09/22  6:25 AM  Result Value Ref Range   aPTT 34 24 - 36 seconds  Fibrinogen   Collection Time: 05/09/22  6:25 AM  Result Value Ref Range   Fibrinogen 574 (H) 210 - 475 mg/dL  Prepare RBC (crossmatch)   Collection Time: 05/09/22  1:08 PM  Result Value Ref Range   Order Confirmation      ORDER PROCESSED BY BLOOD BANK Performed at [facility], [address] [place], [place] [address]   CBC with Differential/Platelet   Collection Time: 05/10/22  9:34 AM  Result Value Ref Range   WBC 11.5 (H) 4.0 - 10.5 K/uL   RBC 3.32 (L) 3.87 - 5.11 MIL/uL   Hemoglobin 10.7 (L) 12.0 - 15.0 g/dL   HCT 29.7 (L) 36.0 - 46.0 %   MCV 89.5 80.0 - 100.0 fL   MCH 32.2 26.0 - 34.0 pg   MCHC 36.0 30.0 - 36.0 g/dL   RDW 12.8 11.5 - 15.5 %   Platelets 150 150 - 400 K/uL   nRBC 0.0 0.0 - 0.2 %   Neutrophils Relative % 85 %   Neutro Abs 9.7 (H) 1.7 - 7.7 K/uL   Lymphocytes Relative 7 %   Lymphs Abs 0.8 0.7 - 4.0 K/uL   Monocytes Relative 6 %   Monocytes Absolute 0.7 0.1 - 1.0 K/uL   Eosinophils Relative 0 %   Eosinophils Absolute 0.0 0.0 - 0.5 K/uL   Basophils Relative 0 %   Basophils Absolute 0.0 0.0 - 0.1 K/uL   Immature Granulocytes 2 %   Abs Immature Granulocytes 0.24 (H) 0.00 - 0.07 K/uL  Magnesium   Collection Time: 05/10/22  9:34 AM  Result Value Ref Range   Magnesium 3.1 (H) 1.7 - 2.4 mg/dL  Basic metabolic panel   Collection Time: 05/10/22  9:34 AM  Result Value Ref Range   Sodium 136 135 - 145 mmol/L  Potassium 3.6 3.5 - 5.1 mmol/L   Chloride 109 98 - 111 mmol/L   CO2 20 (L) 22 - 32 mmol/L   Glucose, Bld 113 (H) 70 - 99 mg/dL   BUN <5 (L) 6 - 20 mg/dL   Creatinine, Ser 0.57 0.44 - 1.00 mg/dL   Calcium 7.3 (L) 8.9 - 10.3 mg/dL   GFR, Estimated >60 >60 mL/min   Anion gap 7 5 - 15  Type and screen [place]   Collection Time: 05/11/22 11:14 AM  Result Value Ref  Range   ABO/RH(D) O POS    Antibody Screen NEG    Sample Expiration      05/14/2022,2359 Performed at [facility], [place] [address] [place], [place] [address]   Fibrinogen   Collection Time: 05/11/22 11:15 AM  Result Value Ref Range   Fibrinogen 473 210 - 475 mg/dL  APTT   Collection Time: 05/11/22 11:15 AM  Result Value Ref Range   aPTT 28 24 - 36 seconds  CBC   Collection Time: 05/11/22 11:28 AM  Result Value Ref Range   WBC 11.4 (H) 4.0 - 10.5 K/uL   RBC 3.10 (L) 3.87 - 5.11 MIL/uL   Hemoglobin 9.9 (L) 12.0 - 15.0 g/dL   HCT 28.4 (L) 36.0 - 46.0 %   MCV 91.6 80.0 - 100.0 fL   MCH 31.9 26.0 - 34.0 pg   MCHC 34.9 30.0 - 36.0 g/dL   RDW 13.1 11.5 - 15.5 %   Platelets 163 150 - 400 K/uL   nRBC 0.0 0.0 - 0.2 %  Culture, blood (Routine X 2) w Reflex to ID Panel   Collection Time: 05/11/22  1:12 PM   Specimen: BLOOD  Result Value Ref Range   Specimen Description BLOOD LEFT ANTECUBITAL    Special Requests      BOTTLES DRAWN AEROBIC AND ANAEROBIC Blood Culture results may not be optimal due to an inadequate volume of blood received in culture bottles   Culture      NO GROWTH 3 DAYS Performed at [facility], [place] [address], [place], [place] [address]    Report Status PENDING   Culture, blood (Routine X 2) w Reflex to ID Panel   Collection Time: 05/11/22  1:43 PM   Specimen: BLOOD LEFT HAND  Result Value Ref Range   Specimen Description BLOOD LEFT HAND    Special Requests      BOTTLES DRAWN AEROBIC AND ANAEROBIC Blood Culture results may not be optimal due to an inadequate volume of blood received in culture bottles   Culture      NO GROWTH 3 DAYS Performed at [facility], [place] [address] [place], [place] [address]    Report Status PENDING   Type and screen [place]   Collection Time: 05/14/22  4:29 AM  Result Value Ref Range   ABO/RH(D) O POS    Antibody Screen NEG    Sample Expiration      05/17/2022,2359 Performed at [facility], [place] [address], [place],  [address]   CBC   Collection Time: 05/14/22  4:35 AM  Result Value Ref Range   WBC 8.8 4.0 - 10.5 K/uL   RBC 2.97 (L) 3.87 - 5.11 MIL/uL   Hemoglobin 9.3 (L) 12.0 - 15.0 g/dL   HCT 26.9 (L) 36.0 - 46.0 %   MCV 90.6 80.0 - 100.0 fL   MCH 31.3 26.0 - 34.0 pg  MCHC 34.6 30.0 - 36.0 g/dL   RDW 12.2 11.5 - 15.5 %   Platelets 215 150 - 400 K/uL   nRBC 0.0 0.0 - 0.2 %   [place] MFM OB LIMITED  Result Date: 05/09/2022 ----------------------------------------------------------------------  OBSTETRICS REPORT                       (Signed Final 05/09/2022 03:28 pm) ---------------------------------------------------------------------- Patient Info  ID #:       [id number]                          D.O.B.:  Apr 13, 1995 (26 yrs)  Name:       Creta Levin                  Visit Date: 05/09/2022 02:08 pm ---------------------------------------------------------------------- Performed By  Attending:        Valeda Malm DO       Ref. Address:     Faculty  Performed By:     Berlinda Last          Secondary Phy.:   [facility]                    RDMS                                                             Care  Referred By:      Vickii Chafe                  [facility]                    CONSTANT MD                              [place] ---------------------------------------------------------------------- Orders  #  Description                           Code        Ordered By  1  [place] MFM OB LIMITED                     [id number]    PEGGY CONSTANT ----------------------------------------------------------------------  #  Order #                     Accession #                Episode #  1  [id number]                   [id number]                 [id number] ---------------------------------------------------------------------- Indications  Vaginal bleeding in pregnancy, third trimester O46.93  [redacted] weeks gestation of pregnancy                Z3A.30  ---------------------------------------------------------------------- Fetal Evaluation  Num Of Fetuses:         1  Fetal Heart Rate(bpm):  130  Cardiac Activity:       Observed  Presentation:  Cephalic  Placenta:               Posterior  P. Cord Insertion:      Previously visualized  Amniotic Fluid  AFI FV:      Within normal limits  AFI Sum(cm)     %Tile       Largest Pocket(cm)  10              14          4.9  RUQ(cm)       RLQ(cm)       LUQ(cm)        LLQ(cm)  4.9           1.2           1.3            2.6  Comment:    No placental abruption or previa identified. ---------------------------------------------------------------------- OB History  Gravidity:    5         Term:   3        Prem:   0        SAB:   0  TOP:          1       Ectopic:  0        Living: 3 ---------------------------------------------------------------------- Gestational Age  LMP:           30w 4d        Date:  10/07/21                   EDD:   07/14/22  Clinical EDD:  27w 0d                                        EDD:   08/08/22  Best:          30w 4d     Det. By:  LMP  (10/07/21)          EDD:   07/14/22 ---------------------------------------------------------------------- Anatomy  Cranium:               Appears normal         Stomach:                Appears normal, left                                                                        sided  Ventricles:            Appears normal         Kidneys:                Appear normal  Diaphragm:             Appears normal         Bladder:                Appears normal ---------------------------------------------------------------------- Cervix Uterus Adnexa  Cervix  Not visualized (advanced GA >24wks)  Uterus  No abnormality visualized.  Right Ovary  Within normal limits.  Left Ovary  Simple cyst measuring 3.3x 3.1x 3.5cm  Adnexa  No abnormality visualized ---------------------------------------------------------------------- Comments  Hospital Ultrasound  30w 4d who presents to the MAU  for flank pain and was  admitted for pyelo. She also has vaginal bleeding and passed  some small clots. EDD: 07/14/2022 by LMP  (10/07/21).  Sonographic findings  Single intrauterine pregnancy.  Fetal cardiac activity: Observed and appears normal.  Presentation: Cephalic.  Limited fetal anatomy appears normal.  Amniotic fluid volume: Within normal limits. AFI: 10. cm.  MVP: 4.9 cm.  Placenta: Posterior. There is no sonographic evidence of  bleeding but the placenta is heavily calcified.  Appropriate movement and tone for gestational age.  Recommendations  - While there is no sonogrpahic evidence of placental  bleeding, placental abruption is a clinical diagnosis and  ultrasound findings of placental beeding are seen in less than  25% of cases  - Continue clinical management per [facility] provider  This was a limited ultrasound with a remote read. If an official  MFM consult is requested for any reason please call/place an  order in [place]. ----------------------------------------------------------------------                  Valeda Malm, DO Electronically Signed Final Report   05/09/2022 03:28 pm ----------------------------------------------------------------------  US RENAL  Result Date: 05/08/2022 CLINICAL DATA:  Acute flank pain for 7 days. Fever and chills. Thirty week pregnant patient. EXAM: RENAL / URINARY TRACT ULTRASOUND COMPLETE COMPARISON:  None Available. FINDINGS: Right Kidney: Renal measurements: 12.4 x 6.8 x 5.9 cm = volume: 260 mL. Echogenicity within normal limits. No mass or hydronephrosis visualized. Left Kidney: Renal measurements: 14.1 x 7.2 x 5.4 cm = volume: 289 mL. Echogenicity within normal limits. No mass or hydronephrosis visualized. Bladder: Appears normal for degree of bladder distention. Other: None. IMPRESSION: Normal study. No cause for the patient's symptoms identified. Electronically Signed   By: Dorise Bullion III M.D.   On: 05/08/2022 17:21   [place] MFM OB DETAIL +14 WK  Result  Date: 04/24/2022 ----------------------------------------------------------------------  OBSTETRICS REPORT                       (Signed Final 04/24/2022 01:28 pm) ---------------------------------------------------------------------- Patient Info  ID #:       [id number]                          D.O.B.:  1995/12/31 (26 yrs)  Name:       Creta Levin                  Visit Date: 04/24/2022 12:10 pm ---------------------------------------------------------------------- Performed By  Attending:        Sander Nephew      Ref. Address:     1st floor                    MD                                                             [place]  [place], [place]  Performed By:     Nevin Bloodgood          [facility]         [facility]                    RDMS                                     [facility] at                                                             [place] [facility]                                                             [facility]  Referred By:      Woodroe Mode                    MD ---------------------------------------------------------------------- Orders  #  Description                           Code        Ordered By  1  [place] MFM OB DETAIL +14 WK               76811.01    Emeterio Reeve ----------------------------------------------------------------------  #  Order #                     Accession #                Episode #  1  [id number]                   [id number]                 [id number] ---------------------------------------------------------------------- Indications  Obesity complicating pregnancy, third          O99.213  trimester (BMI 33)  Short interval between pregancies, 3rd         O09.893  trimester  Encounter for antenatal screening for          Z36.3  malformations  [redacted] weeks gestation of pregnancy                Z3A.28  History of cesarean delivery, currently         O34.219  pregnant ---------------------------------------------------------------------- Fetal Evaluation  Num Of Fetuses:         1  Fetal Heart Rate(bpm):  146  Cardiac Activity:       Observed  Presentation:  Cephalic  Placenta:               Posterior  P. Cord Insertion:      Visualized  Amniotic Fluid  AFI FV:      Within normal limits  AFI Sum(cm)     %Tile       Largest Pocket(cm)  15.03           53          4.59  RUQ(cm)       RLQ(cm)       LUQ(cm)        LLQ(cm)  2.88          2.89          5.87           3.39 ---------------------------------------------------------------------- Biometry  BPD:      72.9  mm     G. Age:  29w 2d         65  %    CI:        79.35   %    70 - 86                                                          FL/HC:      21.1   %    18.8 - 20.6  HC:      258.7  mm     G. Age:  28w 1d         13  %    HC/AC:      1.03        1.05 - 1.21  AC:      250.6  mm     G. Age:  29w 2d         69  %    FL/BPD:     74.9   %    71 - 87  FL:       54.6  mm     G. Age:  28w 6d         47  %    FL/AC:      21.8   %    20 - 24  HUM:      51.3  mm     G. Age:  30w 0d         77  %  Est. FW:    1321  gm    2 lb 15 oz      60  % ---------------------------------------------------------------------- OB History  Gravidity:    5         Term:   3        Prem:   0        SAB:   0  TOP:          1       Ectopic:  0        Living: 3 ---------------------------------------------------------------------- Gestational Age  LMP:           28w 3d        Date:  10/07/21                   EDD:   07/14/22  Clinical EDD:  24w 6d  EDD:   08/08/22  U/S Today:     28w 6d                                        EDD:   07/11/22  Best:          28w 3d     Det. By:  LMP  (10/07/21)          EDD:   07/14/22 ---------------------------------------------------------------------- Anatomy  Cranium:               Appears normal         LVOT:                   Appears normal  Cavum:                  Appears normal         Aortic Arch:            Not well visualized  Ventricles:            Not well visualized    Ductal Arch:            Not well visualized  Choroid Plexus:        Appears normal         Diaphragm:              Appears normal  Cerebellum:            Not well visualized    Stomach:                Appears normal, left                                                                        sided  Posterior Fossa:       Not well visualized    Abdomen:                Appears normal  Nuchal Fold:           Not applicable ([id number]    Abdominal Wall:         Appears nml (cord                         wks GA)                                        insert, abd wall)  Face:                  Not well visualized    Cord Vessels:           Appears normal (3  vessel cord)  Lips:                  Not well visualized    Kidneys:                Appear normal  Palate:                Not well visualized    Bladder:                Appears normal  Thoracic:              Appears normal         Spine:                  Limited views                                                                        appear normal  Heart:                 Appears normal         Upper Extremities:      Appears normal                         (4CH, axis, and                         situs)  RVOT:                  Not well visualized    Lower Extremities:      Appears normal  Other:  Fetus appears to be a female. Parents do not wish to know sex of fetus.          Feet visualized. RT hand visualized. VC, 3VV and 3VTV visualized. ---------------------------------------------------------------------- Cervix Uterus Adnexa  Cervix  Not visualized (advanced GA >24wks)  Uterus  No abnormality visualized.  Right Ovary  Within normal limits.  Left Ovary  Simple cyst  Cul De Sac  No free fluid seen.  Adnexa  No abnormality visualized  ---------------------------------------------------------------------- Impression  Single intrauterine pregnancy here for a detailed anatomy  Normal anatomy with measurements consistent with dates  LMP and today's exam. Therefore we have dated the  pregnancy by LMP.  There is good fetal movement and amniotic fluid volume  Suboptimal views of the fetal anatomy were obtained  secondary to fetal position and advanced gestational age. ---------------------------------------------------------------------- Recommendations  Follow up growth and anatomy in 4 weeks. ----------------------------------------------------------------------              Sander Nephew, MD Electronically Signed Final Report   04/24/2022 01:28 pm ----------------------------------------------------------------------   Future Appointments  Date Time Provider [place]  05/22/2022  2:15 PM [facility] NURSE [facility]  05/22/2022  2:30 PM [facility] US2 [facility]    Discharge Condition: Guarded  Discharge disposition: 07-Left Against Medical Advice/Left Without Being Seen/Elopement        Allergies as of 05/14/2022   No Known Allergies      Medication List     STOP taking these medications    metroNIDAZOLE 500 MG tablet Commonly known as: Flagyl  TAKE these medications    acetaminophen 325 MG tablet Commonly known as: Tylenol Take 2 tablets (650 mg total) by mouth every 4 (four) hours as needed (for pain scale < 4).   amoxicillin-clavulanate 875-125 MG tablet Commonly known as: AUGMENTIN Take 1 tablet by mouth 2 (two) times daily.   aspirin EC 81 MG tablet Take 1 tablet (81 mg total) by mouth daily. Take daily until delivery for preeclampsia prevention   Blood Pressure Kit Devi 1 kit by Does not apply route once a week.   cephALEXin 500 MG capsule Commonly known as: Keflex Take 1 capsule (500 mg total) by mouth at bedtime. Take daily to suppress UTI for rest of pregnancy. Start this after  Augmentin therapy.   ferrous sulfate 325 (65 FE) MG tablet Commonly known as: FerrouSul Take 1 tablet (325 mg total) by mouth every other day.   Gojji Weight Scale Misc 1 Device by Does not apply route every 30 (thirty) days.   prenatal vitamin w/FE, FA 27-1 MG Tabs tablet Take 1 tablet by mouth daily at 12 noon.         Total discharge time: 30 minutes   Signed: Verita Schneiders M.D. 05/14/2022, 4:06 PM

## 2022-05-14 NOTE — Progress Notes (Signed)
Patient requested to leave AMA. MD notified and discussed with patient the risks of leaving AMA. The patient did not have any questions or concerns. The patient signed the AMA form and left the unit ambulatory with her significant other at this time.

## 2022-05-14 NOTE — Progress Notes (Signed)
I was informed that patient is leaving against medical advice  Forms signed.  She will follow up with office as scheduled, bleeding/PTL/FM precautions reviewed with patient.    Verita Schneiders, MD, [place] for [employer], [place]

## 2022-05-14 NOTE — Progress Notes (Signed)
[place] NOTE  Holly Greene is a 27 y.o. [id number] at [redacted]w[redacted]d  who is admitted for pyelonephritis and developed vaginal bleeding during admission.   Fetal presentation is cephalic. Length of Stay:  6  Days  Subjective: Patient has no complaints this morning.  Wants to go home due to childcare issues Patient reports good fetal movement.  She reports no uterine contractions, no bleeding and no loss of fluid per vagina. Last bleeding was on Friday (05/11/22)  Vitals:  Blood pressure 107/66, pulse 68, temperature 97.9 F (36.6 C), temperature source Oral, resp. rate 18, height 5\' 6"  (1.676 m), weight 94.2 kg, last menstrual period 10/07/2021, SpO2 98 %, unknown if currently breastfeeding.  Physical Examination: Lungs clear Heart RRR Abd soft + BS gravid Ext non tender  Fetal Monitoring:  Baseline: 140 bpm, Variability: Moderate (6-25 bpm), Accelerations: Reactive, and Decelerations: Absent  Labs:  Results for orders placed or performed during the hospital encounter of 05/08/22 (from the past 24 hour(s))  Type and screen [place]   Collection Time: 05/14/22  4:29 AM  Result Value Ref Range   ABO/RH(D) O POS    Antibody Screen NEG    Sample Expiration      05/17/2022,2359 Performed at [facility], [place] [address] [place], [place] [address]   CBC   Collection Time: 05/14/22  4:35 AM  Result Value Ref Range   WBC 8.8 4.0 - 10.5 K/uL   RBC 2.97 (L) 3.87 - 5.11 MIL/uL   Hemoglobin 9.3 (L) 12.0 - 15.0 g/dL   HCT 26.9 (L) 36.0 - 46.0 %   MCV 90.6 80.0 - 100.0 fL   MCH 31.3 26.0 - 34.0 pg   MCHC 34.6 30.0 - 36.0 g/dL   RDW 12.2 11.5 - 15.5 %   Platelets 215 150 - 400 K/uL   nRBC 0.0 0.0 - 0.2 %    Imaging Studies:    NA   Medications:  Scheduled  amoxicillin-clavulanate  1 tablet Oral Q12H   docusate sodium  100 mg Oral Daily   enoxaparin (LOVENOX) injection  50 mg Subcutaneous Q24H   prenatal multivitamin  1  tablet Oral Q1200   I have reviewed the patient's current medications.  ASSESSMENT: IUP 31 2/7 weeks Pyelonephritis Vaginal bleeding, last bleeding 05/12/22  PLAN: Stable. BC and UC, + E.coli. Repeat BC negative. On Augmentin for a total of 14 days. (5/14) Suppression therapy after 14 days.  S/P Magnesium and BMZ. Continue in house observation for 7 days without bleeding. Patient is aware that leaving prior to 7 days is leaving against medical advice, discussed implications of this in detail. She will think about this. Fetal well being reassuring. Continue routine antenatal care.   [name] 05/14/2022,9:43 AM

## 2022-05-16 LAB — CULTURE, BLOOD (ROUTINE X 2)
Culture: NO GROWTH
Culture: NO GROWTH

## 2022-05-17 ENCOUNTER — Telehealth: Payer: Self-pay

## 2022-05-17 NOTE — Telephone Encounter (Signed)
Patient called stating that when she went to the bathroom and noticed some egg white like discharge that had a tinge of red blood in it. Patient denies any other vaginal bleeding or leakage of fluid. Patient is feeling baby move well.   Patient advised to continue to monitor. If she she begins having leakage of fluid, bleeding, decreased fetal movement, or contractions every 5 min, lasting for a min, longer than an hour to report to MAU for evaluation.

## 2022-05-22 ENCOUNTER — Ambulatory Visit: Payer: Medicaid Other | Attending: Maternal & Fetal Medicine

## 2022-05-22 ENCOUNTER — Ambulatory Visit: Payer: Medicaid Other

## 2022-05-23 ENCOUNTER — Encounter (HOSPITAL_COMMUNITY)
Admission: AD | Disposition: A | Discharge status: Home / Self Care | Payer: Self-pay | Source: Home / Self Care | Attending: Obstetrics & Gynecology

## 2022-05-23 ENCOUNTER — Inpatient Hospital Stay (HOSPITAL_COMMUNITY): Payer: Medicaid Other | Admitting: Anesthesiology

## 2022-05-23 ENCOUNTER — Inpatient Hospital Stay (HOSPITAL_COMMUNITY)
Admission: AD | Admit: 2022-05-23 | Discharge: 2022-05-26 | DRG: 786 | Disposition: A | Discharge status: Home / Self Care | Payer: Medicaid Other | Attending: Obstetrics & Gynecology | Admitting: Obstetrics & Gynecology

## 2022-05-23 ENCOUNTER — Other Ambulatory Visit: Payer: Self-pay

## 2022-05-23 ENCOUNTER — Encounter (HOSPITAL_COMMUNITY): Payer: Self-pay | Admitting: Obstetrics & Gynecology

## 2022-05-23 DIAGNOSIS — Z3A32 32 weeks gestation of pregnancy: Secondary | ICD-10-CM

## 2022-05-23 DIAGNOSIS — O321XX0 Maternal care for breech presentation, not applicable or unspecified: Secondary | ICD-10-CM | POA: Diagnosis present

## 2022-05-23 DIAGNOSIS — O42013 Preterm premature rupture of membranes, onset of labor within 24 hours of rupture, third trimester: Secondary | ICD-10-CM

## 2022-05-23 DIAGNOSIS — Z30430 Encounter for insertion of intrauterine contraceptive device: Secondary | ICD-10-CM | POA: Diagnosis not present

## 2022-05-23 DIAGNOSIS — Z23 Encounter for immunization: Secondary | ICD-10-CM | POA: Diagnosis not present

## 2022-05-23 DIAGNOSIS — O4700 False labor before 37 completed weeks of gestation, unspecified trimester: Secondary | ICD-10-CM

## 2022-05-23 DIAGNOSIS — O164 Unspecified maternal hypertension, complicating childbirth: Secondary | ICD-10-CM

## 2022-05-23 DIAGNOSIS — O9882 Other maternal infectious and parasitic diseases complicating childbirth: Secondary | ICD-10-CM | POA: Diagnosis present

## 2022-05-23 DIAGNOSIS — O4593 Premature separation of placenta, unspecified, third trimester: Secondary | ICD-10-CM

## 2022-05-23 DIAGNOSIS — O34211 Maternal care for low transverse scar from previous cesarean delivery: Secondary | ICD-10-CM | POA: Diagnosis present

## 2022-05-23 DIAGNOSIS — O4693 Antepartum hemorrhage, unspecified, third trimester: Secondary | ICD-10-CM | POA: Diagnosis present

## 2022-05-23 DIAGNOSIS — Z3480 Encounter for supervision of other normal pregnancy, unspecified trimester: Secondary | ICD-10-CM

## 2022-05-23 DIAGNOSIS — O9902 Anemia complicating childbirth: Secondary | ICD-10-CM | POA: Diagnosis present

## 2022-05-23 DIAGNOSIS — O0930 Supervision of pregnancy with insufficient antenatal care, unspecified trimester: Secondary | ICD-10-CM

## 2022-05-23 DIAGNOSIS — O4590 Premature separation of placenta, unspecified, unspecified trimester: Secondary | ICD-10-CM | POA: Diagnosis present

## 2022-05-23 DIAGNOSIS — O2303 Infections of kidney in pregnancy, third trimester: Secondary | ICD-10-CM | POA: Diagnosis present

## 2022-05-23 DIAGNOSIS — Z98891 History of uterine scar from previous surgery: Principal | ICD-10-CM

## 2022-05-23 DIAGNOSIS — O135 Gestational [pregnancy-induced] hypertension without significant proteinuria, complicating the puerperium: Secondary | ICD-10-CM | POA: Diagnosis not present

## 2022-05-23 DIAGNOSIS — O329XX0 Maternal care for malpresentation of fetus, unspecified, not applicable or unspecified: Secondary | ICD-10-CM | POA: Diagnosis present

## 2022-05-23 DIAGNOSIS — N12 Tubulo-interstitial nephritis, not specified as acute or chronic: Secondary | ICD-10-CM | POA: Diagnosis present

## 2022-05-23 HISTORY — PX: INTRAUTERINE DEVICE (IUD) INSERTION: SHX5877

## 2022-05-23 LAB — COMPREHENSIVE METABOLIC PANEL
ALT: 12 U/L (ref 0–44)
AST: 29 U/L (ref 15–41)
Albumin: 3 g/dL — ABNORMAL LOW (ref 3.5–5.0)
Alkaline Phosphatase: 85 U/L (ref 38–126)
Anion gap: 11 (ref 5–15)
BUN: 6 mg/dL (ref 6–20)
CO2: 21 mmol/L — ABNORMAL LOW (ref 22–32)
Calcium: 9.4 mg/dL (ref 8.9–10.3)
Chloride: 104 mmol/L (ref 98–111)
Creatinine, Ser: 0.58 mg/dL (ref 0.44–1.00)
GFR, Estimated: >60 mL/min (ref >60)
Glucose, Bld: 92 mg/dL (ref 70–99)
Potassium: 3.8 mmol/L (ref 3.5–5.1)
Sodium: 136 mmol/L (ref 135–145)
Total Bilirubin: 0.1 mg/dL — ABNORMAL LOW (ref 0.3–1.2)
Total Protein: 6.7 g/dL (ref 6.5–8.1)

## 2022-05-23 LAB — DIC (DISSEMINATED INTRAVASCULAR COAGULATION)PANEL
D-Dimer, Quant: 2.81 ug/mL-FEU — ABNORMAL HIGH (ref 0.00–0.50)
Fibrinogen: 714 mg/dL — ABNORMAL HIGH (ref 210–475)
INR: 1 (ref 0.8–1.2)
Platelets: 312 10*3/uL (ref 150–400)
Prothrombin Time: 12.7 seconds (ref 11.4–15.2)
Smear Review: NONE SEEN
aPTT: 32 seconds (ref 24–36)

## 2022-05-23 LAB — TYPE AND SCREEN
ABO/RH(D): O POS
Antibody Screen: NEGATIVE

## 2022-05-23 LAB — CBC
HCT: 35.5 % — ABNORMAL LOW (ref 36.0–46.0)
HCT: 30.6 % — ABNORMAL LOW (ref 36.0–46.0)
Hemoglobin: 11.5 g/dL — ABNORMAL LOW (ref 12.0–15.0)
Hemoglobin: 10.1 g/dL — ABNORMAL LOW (ref 12.0–15.0)
MCH: 30.5 pg (ref 26.0–34.0)
MCH: 31.1 pg (ref 26.0–34.0)
MCHC: 32.4 g/dL (ref 30.0–36.0)
MCHC: 33 g/dL (ref 30.0–36.0)
MCV: 94.2 fL (ref 80.0–100.0)
MCV: 94.2 fL (ref 80.0–100.0)
Platelets: 337 10*3/uL (ref 150–400)
Platelets: 270 10*3/uL (ref 150–400)
RBC: 3.77 MIL/uL — ABNORMAL LOW (ref 3.87–5.11)
RBC: 3.25 MIL/uL — ABNORMAL LOW (ref 3.87–5.11)
RDW: 12.4 % (ref 11.5–15.5)
RDW: 12.4 % (ref 11.5–15.5)
WBC: 11.8 10*3/uL — ABNORMAL HIGH (ref 4.0–10.5)
WBC: 13.8 10*3/uL — ABNORMAL HIGH (ref 4.0–10.5)
nRBC: 0 % (ref 0.0–0.2)
nRBC: 0 % (ref 0.0–0.2)

## 2022-05-23 LAB — HIV ANTIBODY (ROUTINE TESTING W REFLEX): HIV Screen 4th Generation wRfx: NONREACTIVE

## 2022-05-23 SURGERY — Surgical Case
Anesthesia: Spinal | Site: Uterus

## 2022-05-23 MED ORDER — WITCH HAZEL-GLYCERIN EX PADS: 1.0000 | MEDICATED_PAD | CUTANEOUS | Status: DC | PRN

## 2022-05-23 MED ORDER — FERROUS SULFATE 325 (65 FE) MG PO TABS
325.0000 mg | ORAL_TABLET | ORAL | Status: DC
  Administered 2022-05-24 – 2022-05-26 (×2): 325 mg via ORAL
  Filled 2022-05-23 (×2): qty 1

## 2022-05-23 MED ORDER — NALOXONE HCL 0.4 MG/ML IJ SOLN: 0.4000 mg | INTRAMUSCULAR | Status: DC | PRN

## 2022-05-23 MED ORDER — POVIDONE-IODINE 10 % EX SWAB: 2.0000 | Freq: Once | CUTANEOUS | Status: DC

## 2022-05-23 MED ORDER — LACTATED RINGERS IV SOLN: INTRAVENOUS | Status: DC | PRN

## 2022-05-23 MED ORDER — STERILE WATER FOR IRRIGATION IR SOLN
Status: DC | PRN
  Administered 2022-05-23: 1000 mL

## 2022-05-23 MED ORDER — HYDROMORPHONE HCL 1 MG/ML IJ SOLN: 1.0000 mg | INTRAMUSCULAR | Status: DC | PRN

## 2022-05-23 MED ORDER — CEFAZOLIN SODIUM-DEXTROSE 2-4 GM/100ML-% IV SOLN
2.0000 g | INTRAVENOUS | Status: DC
  Filled 2022-05-23: qty 100

## 2022-05-23 MED ORDER — KETOROLAC TROMETHAMINE 30 MG/ML IJ SOLN
30.0000 mg | Freq: Once | INTRAMUSCULAR | Status: AC | PRN
  Administered 2022-05-23: 30 mg via INTRAVENOUS

## 2022-05-23 MED ORDER — PRENATAL MULTIVITAMIN CH
1.0000 | ORAL_TABLET | Freq: Every day | ORAL | Status: DC
  Administered 2022-05-23 – 2022-05-26 (×4): 1 via ORAL
  Filled 2022-05-23 (×4): qty 1

## 2022-05-23 MED ORDER — TRANEXAMIC ACID-NACL 1000-0.7 MG/100ML-% IV SOLN: 1000.0000 mg | INTRAVENOUS | Status: AC

## 2022-05-23 MED ORDER — SOD CITRATE-CITRIC ACID 500-334 MG/5ML PO SOLN: 30.0000 mL | ORAL | Status: DC

## 2022-05-23 MED ORDER — DEXAMETHASONE SODIUM PHOSPHATE 10 MG/ML IJ SOLN
INTRAMUSCULAR | Status: DC | PRN
  Administered 2022-05-23: 10 mg via INTRAVENOUS

## 2022-05-23 MED ORDER — LACTATED RINGERS IV BOLUS
1000.0000 mL | Freq: Once | INTRAVENOUS | Status: AC
  Administered 2022-05-23: 1000 mL via INTRAVENOUS

## 2022-05-23 MED ORDER — SODIUM CHLORIDE 0.9% FLUSH: 3.0000 mL | INTRAVENOUS | Status: DC | PRN

## 2022-05-23 MED ORDER — OXYCODONE HCL 5 MG/5ML PO SOLN: 5.0000 mg | Freq: Once | ORAL | Status: DC | PRN

## 2022-05-23 MED ORDER — MEPERIDINE HCL 25 MG/ML IJ SOLN: 6.2500 mg | INTRAMUSCULAR | Status: DC | PRN

## 2022-05-23 MED ORDER — LACTATED RINGERS IV SOLN: INTRAVENOUS | Status: DC

## 2022-05-23 MED ORDER — ZOLPIDEM TARTRATE 5 MG PO TABS: 5.0000 mg | ORAL_TABLET | Freq: Every evening | ORAL | Status: DC | PRN

## 2022-05-23 MED ORDER — ACETAMINOPHEN 10 MG/ML IV SOLN
INTRAVENOUS | Status: DC | PRN
  Administered 2022-05-23: 1000 mg via INTRAVENOUS

## 2022-05-23 MED ORDER — SIMETHICONE 80 MG PO CHEW: 80.0000 mg | CHEWABLE_TABLET | ORAL | Status: DC | PRN

## 2022-05-23 MED ORDER — OXYTOCIN-SODIUM CHLORIDE 30-0.9 UT/500ML-% IV SOLN
2.5000 [IU]/h | INTRAVENOUS | Status: AC
  Administered 2022-05-23: 2.5 [IU]/h via INTRAVENOUS
  Filled 2022-05-23: qty 500

## 2022-05-23 MED ORDER — SENNOSIDES-DOCUSATE SODIUM 8.6-50 MG PO TABS
2.0000 | ORAL_TABLET | Freq: Every day | ORAL | Status: DC
  Administered 2022-05-24 – 2022-05-25 (×2): 2 via ORAL
  Filled 2022-05-23 (×3): qty 2

## 2022-05-23 MED ORDER — SODIUM CHLORIDE 0.9 % IR SOLN
Status: DC | PRN
  Administered 2022-05-23: 1

## 2022-05-23 MED ORDER — ONDANSETRON HCL 4 MG/2ML IJ SOLN
INTRAMUSCULAR | Status: DC | PRN
  Administered 2022-05-23: 4 mg via INTRAVENOUS

## 2022-05-23 MED ORDER — PHENYLEPHRINE HCL-NACL 20-0.9 MG/250ML-% IV SOLN
INTRAVENOUS | Status: DC | PRN
  Administered 2022-05-23: 60 ug/min via INTRAVENOUS

## 2022-05-23 MED ORDER — FENTANYL CITRATE (PF) 100 MCG/2ML IJ SOLN
INTRAMUSCULAR | Status: AC
  Filled 2022-05-23: qty 2

## 2022-05-23 MED ORDER — SOD CITRATE-CITRIC ACID 500-334 MG/5ML PO SOLN
ORAL | Status: AC
  Administered 2022-05-23: 30 mL
  Filled 2022-05-23: qty 30

## 2022-05-23 MED ORDER — MORPHINE SULFATE (PF) 0.5 MG/ML IJ SOLN
INTRAMUSCULAR | Status: AC
  Filled 2022-05-23: qty 10

## 2022-05-23 MED ORDER — LEVONORGESTREL 20 MCG/DAY IU IUD
INTRAUTERINE_SYSTEM | INTRAUTERINE | Status: AC
  Filled 2022-05-23: qty 1

## 2022-05-23 MED ORDER — BUPIVACAINE IN DEXTROSE 0.75-8.25 % IT SOLN
INTRATHECAL | Status: DC | PRN
  Administered 2022-05-23: 12 mg via INTRATHECAL

## 2022-05-23 MED ORDER — OXYCODONE-ACETAMINOPHEN 5-325 MG PO TABS: 2.0000 | ORAL_TABLET | ORAL | Status: DC | PRN

## 2022-05-23 MED ORDER — SODIUM CHLORIDE 0.9 % IV SOLN
500.0000 mg | INTRAVENOUS | Status: AC
  Administered 2022-05-23: 500 mg via INTRAVENOUS
  Filled 2022-05-23: qty 5

## 2022-05-23 MED ORDER — MENTHOL 3 MG MT LOZG: 1.0000 | LOZENGE | OROMUCOSAL | Status: DC | PRN

## 2022-05-23 MED ORDER — HYDROMORPHONE HCL 1 MG/ML IJ SOLN: 0.2500 mg | INTRAMUSCULAR | Status: DC | PRN

## 2022-05-23 MED ORDER — OXIDIZED CELLULOSE EX PADS
MEDICATED_PAD | CUTANEOUS | Status: DC | PRN
  Administered 2022-05-23: 1

## 2022-05-23 MED ORDER — DIPHENHYDRAMINE HCL 25 MG PO CAPS
25.0000 mg | ORAL_CAPSULE | Freq: Four times a day (QID) | ORAL | Status: DC | PRN
  Administered 2022-05-23: 25 mg via ORAL
  Filled 2022-05-23: qty 1

## 2022-05-23 MED ORDER — DIPHENHYDRAMINE HCL 25 MG PO CAPS: 25.0000 mg | ORAL_CAPSULE | ORAL | Status: DC | PRN

## 2022-05-23 MED ORDER — MAGNESIUM HYDROXIDE 400 MG/5ML PO SUSP: 30.0000 mL | ORAL | Status: DC | PRN

## 2022-05-23 MED ORDER — CEFAZOLIN SODIUM-DEXTROSE 2-3 GM-%(50ML) IV SOLR
INTRAVENOUS | Status: DC | PRN
  Administered 2022-05-23: 2 g via INTRAVENOUS

## 2022-05-23 MED ORDER — FENTANYL CITRATE (PF) 100 MCG/2ML IJ SOLN
INTRAMUSCULAR | Status: DC | PRN
  Administered 2022-05-23: 15 ug via INTRATHECAL

## 2022-05-23 MED ORDER — OXYCODONE HCL 5 MG PO TABS: 5.0000 mg | ORAL_TABLET | Freq: Once | ORAL | Status: DC | PRN

## 2022-05-23 MED ORDER — DEXMEDETOMIDINE HCL IN NACL 80 MCG/20ML IV SOLN
INTRAVENOUS | Status: AC
  Filled 2022-05-23: qty 20

## 2022-05-23 MED ORDER — MORPHINE SULFATE (PF) 0.5 MG/ML IJ SOLN
INTRAMUSCULAR | Status: DC | PRN
  Administered 2022-05-23: 150 ug via INTRATHECAL

## 2022-05-23 MED ORDER — IBUPROFEN 600 MG PO TABS
600.0000 mg | ORAL_TABLET | Freq: Four times a day (QID) | ORAL | Status: DC
  Administered 2022-05-24 – 2022-05-26 (×9): 600 mg via ORAL
  Filled 2022-05-23 (×9): qty 1

## 2022-05-23 MED ORDER — KETOROLAC TROMETHAMINE 30 MG/ML IJ SOLN
30.0000 mg | Freq: Four times a day (QID) | INTRAMUSCULAR | Status: AC
  Administered 2022-05-23 – 2022-05-24 (×4): 30 mg via INTRAVENOUS
  Filled 2022-05-23 (×4): qty 1

## 2022-05-23 MED ORDER — OXYCODONE HCL 5 MG PO TABS
5.0000 mg | ORAL_TABLET | ORAL | Status: DC | PRN
  Administered 2022-05-24 – 2022-05-26 (×7): 10 mg via ORAL
  Filled 2022-05-23 (×7): qty 2

## 2022-05-23 MED ORDER — ONDANSETRON HCL 4 MG/2ML IJ SOLN: 4.0000 mg | Freq: Three times a day (TID) | INTRAMUSCULAR | Status: DC | PRN

## 2022-05-23 MED ORDER — DIPHENHYDRAMINE HCL 50 MG/ML IJ SOLN: 12.5000 mg | INTRAMUSCULAR | Status: DC | PRN

## 2022-05-23 MED ORDER — SCOPOLAMINE 1 MG/3DAYS TD PT72
MEDICATED_PATCH | TRANSDERMAL | Status: AC
  Filled 2022-05-23: qty 1

## 2022-05-23 MED ORDER — ONDANSETRON HCL 4 MG/2ML IJ SOLN: 4.0000 mg | Freq: Once | INTRAMUSCULAR | Status: DC | PRN

## 2022-05-23 MED ORDER — TRANEXAMIC ACID-NACL 1000-0.7 MG/100ML-% IV SOLN
INTRAVENOUS | Status: DC | PRN
  Administered 2022-05-23: 1000 mg via INTRAVENOUS

## 2022-05-23 MED ORDER — ACETAMINOPHEN 500 MG PO TABS
1000.0000 mg | ORAL_TABLET | Freq: Four times a day (QID) | ORAL | Status: DC
  Administered 2022-05-23 – 2022-05-26 (×12): 1000 mg via ORAL
  Filled 2022-05-23 (×13): qty 2

## 2022-05-23 MED ORDER — FENTANYL CITRATE (PF) 100 MCG/2ML IJ SOLN
INTRAMUSCULAR | Status: DC | PRN
  Administered 2022-05-23: 50 ug via INTRAVENOUS

## 2022-05-23 MED ORDER — MEASLES, MUMPS & RUBELLA VAC IJ SOLR
0.5000 mL | Freq: Once | INTRAMUSCULAR | Status: AC
  Administered 2022-05-24: 0.5 mL via SUBCUTANEOUS
  Filled 2022-05-23 (×3): qty 0.5

## 2022-05-23 MED ORDER — KETOROLAC TROMETHAMINE 30 MG/ML IJ SOLN
INTRAMUSCULAR | Status: AC
  Filled 2022-05-23: qty 1

## 2022-05-23 MED ORDER — NALOXONE HCL 4 MG/10ML IJ SOLN: 1.0000 ug/kg/h | INTRAVENOUS | Status: DC | PRN

## 2022-05-23 MED ORDER — DEXMEDETOMIDINE HCL IN NACL 80 MCG/20ML IV SOLN
INTRAVENOUS | Status: DC | PRN
  Administered 2022-05-23 (×2): 8 ug via BUCCAL

## 2022-05-23 MED ORDER — TETANUS-DIPHTH-ACELL PERTUSSIS 5-2.5-18.5 LF-MCG/0.5 IM SUSY
0.5000 mL | PREFILLED_SYRINGE | Freq: Once | INTRAMUSCULAR | Status: AC
  Administered 2022-05-24: 0.5 mL via INTRAMUSCULAR
  Filled 2022-05-23: qty 0.5

## 2022-05-23 MED ORDER — GABAPENTIN 100 MG PO CAPS
300.0000 mg | ORAL_CAPSULE | Freq: Two times a day (BID) | ORAL | Status: DC
  Administered 2022-05-23 – 2022-05-26 (×7): 300 mg via ORAL
  Filled 2022-05-23: qty 1
  Filled 2022-05-23 (×4): qty 3
  Filled 2022-05-23: qty 1
  Filled 2022-05-23: qty 3

## 2022-05-23 MED ORDER — ENOXAPARIN SODIUM 40 MG/0.4ML IJ SOSY
40.0000 mg | PREFILLED_SYRINGE | INTRAMUSCULAR | Status: DC
  Administered 2022-05-23 – 2022-05-25 (×3): 40 mg via SUBCUTANEOUS
  Filled 2022-05-23 (×4): qty 0.4

## 2022-05-23 MED ORDER — LEVONORGESTREL 20 MCG/DAY IU IUD
1.0000 | INTRAUTERINE_SYSTEM | Freq: Once | INTRAUTERINE | Status: AC
  Administered 2022-05-23: 1 via INTRAUTERINE

## 2022-05-23 MED ORDER — COCONUT OIL OIL: 1.0000 | TOPICAL_OIL | Status: DC | PRN

## 2022-05-23 MED ORDER — AMOXICILLIN-POT CLAVULANATE 875-125 MG PO TABS
1.0000 | ORAL_TABLET | Freq: Two times a day (BID) | ORAL | Status: AC
  Administered 2022-05-23 – 2022-05-25 (×6): 1 via ORAL
  Filled 2022-05-23 (×8): qty 1

## 2022-05-23 MED ORDER — DIBUCAINE (PERIANAL) 1 % EX OINT: 1.0000 | TOPICAL_OINTMENT | CUTANEOUS | Status: DC | PRN

## 2022-05-23 MED ORDER — SCOPOLAMINE 1 MG/3DAYS TD PT72
1.0000 | MEDICATED_PATCH | Freq: Once | TRANSDERMAL | Status: AC
  Administered 2022-05-23: 1.5 mg via TRANSDERMAL

## 2022-05-23 MED ORDER — OXYTOCIN-SODIUM CHLORIDE 30-0.9 UT/500ML-% IV SOLN
INTRAVENOUS | Status: DC | PRN
  Administered 2022-05-23: 30 [IU] via INTRAVENOUS

## 2022-05-23 SURGICAL SUPPLY — 35 items
BENZOIN TINCTURE PRP APPL 2/3 (GAUZE/BANDAGES/DRESSINGS) IMPLANT
CHLORAPREP W/TINT 26 (MISCELLANEOUS) ×4 IMPLANT
CLAMP UMBILICAL CORD (MISCELLANEOUS) ×2 IMPLANT
CLOTH BEACON ORANGE TIMEOUT ST (SAFETY) ×2 IMPLANT
DRSG OPSITE POSTOP 4X10 (GAUZE/BANDAGES/DRESSINGS) ×2 IMPLANT
ELECT REM PT RETURN 9FT ADLT (ELECTROSURGICAL) ×2
ELECTRODE REM PT RTRN 9FT ADLT (ELECTROSURGICAL) ×2 IMPLANT
GAUZE SPONGE 4X4 12PLY STRL LF (GAUZE/BANDAGES/DRESSINGS) IMPLANT
GLOVE BIOGEL PI IND STRL 7.0 (GLOVE) ×6 IMPLANT
GLOVE ECLIPSE 7.0 STRL STRAW (GLOVE) ×2 IMPLANT
GOWN STRL REUS W/TWL LRG LVL3 (GOWN DISPOSABLE) ×2 IMPLANT
GOWN STRL REUS W/TWL XL LVL3 (GOWN DISPOSABLE) ×2 IMPLANT
HEMOSTAT SURGICEL 4X8 (HEMOSTASIS) IMPLANT
NDL HYPO 25X5/8 SAFETYGLIDE (NEEDLE) ×2 IMPLANT
NEEDLE HYPO 22GX1.5 SAFETY (NEEDLE) ×2 IMPLANT
NEEDLE HYPO 25X5/8 SAFETYGLIDE (NEEDLE) ×2 IMPLANT
NS IRRIG 1000ML POUR BTL (IV SOLUTION) ×2 IMPLANT
PACK C SECTION WH (CUSTOM PROCEDURE TRAY) ×2 IMPLANT
PAD ABD 7.5X8 STRL (GAUZE/BANDAGES/DRESSINGS) ×2 IMPLANT
PAD OB MATERNITY 4.3X12.25 (PERSONAL CARE ITEMS) ×2 IMPLANT
RTRCTR C-SECT PINK 25CM LRG (MISCELLANEOUS) IMPLANT
STRIP CLOSURE SKIN 1/2X4 (GAUZE/BANDAGES/DRESSINGS) IMPLANT
SUT PDS AB 0 CTX 36 PDP370T (SUTURE) IMPLANT
SUT PLAIN 2 0 XLH (SUTURE) IMPLANT
SUT VIC AB 0 CTX 36 (SUTURE) ×6
SUT VIC AB 0 CTX36XBRD ANBCTRL (SUTURE) ×4 IMPLANT
SUT VIC AB 1 CTX 36 (SUTURE) ×2
SUT VIC AB 1 CTX36XBRD ANBCTRL (SUTURE) IMPLANT
SUT VIC AB 2-0 CT1 27 (SUTURE) ×4
SUT VIC AB 2-0 CT1 TAPERPNT 27 (SUTURE) IMPLANT
SUT VIC AB 4-0 KS 27 (SUTURE) ×2 IMPLANT
SYR CONTROL 10ML LL (SYRINGE) ×2 IMPLANT
TOWEL OR 17X24 6PK STRL BLUE (TOWEL DISPOSABLE) ×2 IMPLANT
TRAY FOLEY W/BAG SLVR 14FR LF (SET/KITS/TRAYS/PACK) ×2 IMPLANT
WATER STERILE IRR 1000ML POUR (IV SOLUTION) ×2 IMPLANT

## 2022-05-23 NOTE — Op Note (Addendum)
Holly Greene PROCEDURE DATE: 05/23/2022  PREOPERATIVE DIAGNOSES: Intrauterine pregnancy at 69w4dweeks gestation; third trimester bleeding; placental abruption; preterm labor; fetal malpresentation; history of previous cesarean section; desires long term reversible contraception  POSTOPERATIVE DIAGNOSES: The same  PROCEDURE: Low Transverse Cesarean Section and Intrauterine Device Placement  SURGEON:  Dr. UVerita Greene ASSISTANT:  Dr. CLiliane Greene An experienced assistant was required given the standard of surgical care given the complexity of the case.  This assistant was needed for exposure, dissection, suctioning, retraction, instrument exchange, assisting with delivery with administration of fundal pressure, and for overall help during the procedure.  ANESTHESIOLOGY TEAM: Anesthesiologist: Holly Glasgow MD CRNA: Holly Cao CRNA  INDICATIONS: Holly Buieis a 27y.o. [id number] for cesarean section secondary to the indications listed under preoperative diagnoses; please see preoperative note for further details.  The risks of surgery were discussed with the patient including but were not limited to: bleeding which may require transfusion or reoperation; infection which may require antibiotics; injury to bowel, bladder, ureters or other surrounding organs; injury to the fetus; need for additional procedures including hysterectomy in the event of a life-threatening hemorrhage; formation of adhesions; placental abnormalities wth subsequent pregnancies; incisional problems; thromboembolic phenomenon and other postoperative/anesthesia complications.  The patient concurred with the proposed plan, giving informed written consent for the procedure.    FINDINGS:  Viable female infant in transverse back up presentation at time of delivery; delivered via breech presentation.  Nuchal cord x 1.  Apgars 3 and 9, weight 1980 g.  Clear amniotic fluid.  Significant bloody fluid  and clots noted in uterus and retroplacentally. Intact placenta, three vessel cord. .  The hysterotomy was made higher up on the lower uterine segment; had to be closed in three layers due to thickness.  There was significant bleeding around the area of left uterine artery that had to be ameliorated with an O'Paulla Foretitch (ligation of the vessels).Normal uterus, fallopian tubes and ovaries bilaterally.   Minimal intraperitoneal adhesive disease.  Mirena intrauterine device was placed prior to hysterotomy closure.  ANESTHESIA: Spinal ESTIMATED BLOOD LOSS: 480 ml SPECIMENS: Placenta sent to pathology COMPLICATIONS: None immediate  PROCEDURE IN DETAIL:  The patient preoperatively received intravenous antibiotics and had sequential compression devices applied to her lower extremities.  She was then taken to the operating room where spinal anesthesia was administered and was found to be adequate. She was then placed in a dorsal supine position with a leftward tilt, and prepped and draped in a sterile manner.  A foley catheter was placed into her bladder and attached to constant gravity.  After an adequate timeout was performed, a Pfannenstiel skin incision was made with scalpel on her preexisting scar and carried through to the underlying layer of fascia. The fascia was incised in the midline, and this incision was extended bilaterally in a blunt fashion.  The underlying rectus muscles were dissected off the fascia superiorly and inferiorly in a blunt fashion. The rectus muscles were separated in the midline and the peritoneum was entered bluntly. The Alexis self-retaining retractor was introduced into the abdominal cavity.  Attention was turned to the lower uterine segment where a low transverse hysterotomy was made with a scalpel and extended bilaterally bluntly.  The infant was successfully delivered, the cord was clamped and cut after one minute, and the infant was handed over to the awaiting neonatology team.  Uterine massage was then administered, and the placenta delivered intact with a three-vessel cord. The  uterus was then cleared of clots and debris.  The Mirena IUD was placed in the fundal region, and the strings were pushed through the lower uterine segment into cervix and upper vagina.  The hysterotomy was closed with 0 Vicryl in a running locked fashion, and two imbricating layers was also placed with 0 Vicryl.  Figure-of-eight 0 Vicryl serosal stitches were placed to help with hemostasis. As mentioned above, there was some bleeding from the left uterine artery area that had to be ameliorated using an O'Leary stitch. Surgicel was placed over the closed hysterotomy to help with further hemostasis. The pelvis was cleared of all clot and debris. Hemostasis was confirmed on all surfaces.  The retractor was removed.  The peritoneum was closed with a 0 Vicryl running stitch. The fascia was then closed using 0 Vicryl in a running fashion.  The subcutaneous layer was irrigated, reapproximated with 2-0 plain gut interrupted stitches, and the skin was closed with a 4-0 Vicryl subcuticular stitch. The patient tolerated the procedure well. Sponge, instrument and needle counts were correct x 3.  She was taken to the recovery room in stable condition.     Verita Schneiders, MD, [place] for [employer], [place]

## 2022-05-23 NOTE — Anesthesia Procedure Notes (Signed)
Spinal  Patient location during procedure: OB Start time: 05/23/2022 3:08 AM End time: 05/23/2022 3:11 AM Reason for block: surgical anesthesia Staffing Performed: anesthesiologist  Anesthesiologist: Barnet Glasgow, MD Performed by: Barnet Glasgow, MD Authorized by: Barnet Glasgow, MD   Preanesthetic Checklist Completed: patient identified, IV checked, risks and benefits discussed, surgical consent, monitors and equipment checked, pre-op evaluation and timeout performed Spinal Block Patient position: sitting Prep: DuraPrep and site prepped and draped Patient monitoring: heart rate, cardiac monitor, continuous pulse ox and blood pressure Approach: midline Location: L2-3 Injection technique: single-shot Needle Needle type: Pencan  Needle gauge: 24 G Needle length: 10 cm Needle insertion depth: 6 cm Assessment Sensory level: T4 Events: CSF return Additional Notes  1 Attempt (s). Pt tolerated procedure well.

## 2022-05-23 NOTE — Lactation Note (Signed)
This note was copied from a baby's chart.  NICU Lactation Consultation Note  Patient Name: Holly Greene [id number] Date: 05/23/2022 Age:28 hours  Subjective Reason for consult: Initial assessment; Other (Comment); NICU baby; Preterm <34wks; Infant < 6lbs (THC (+) in 2019)  Spoke to [facility] Special[facility] care RN [facility] and she reported that Holly Greene is not interested in breastfeeding or pumping after NICU admission, she's just going to do formula. [place] services are completed at this time.  Objective Infant data: Mother's Current Feeding Choice: -- (NPO)  Maternal data: [id number]  C-Section, Low Transverse  Assessment Infant: Feeding Status: NPO  Intervention/Plan Plan: Consult Status: Complete (Patient decided not to pump for baby, formula only)   Holly Greene 05/23/2022, 11:01 AM

## 2022-05-23 NOTE — Discharge Summary (Signed)
Postpartum Discharge Summary    Patient Name: Holly Greene DOB: 1996/01/19 MRN: [id number]  Date of admission: 05/23/2022 Delivery date:05/23/2022  Delivering provider: Verita Schneiders A  Date of discharge: 05/26/2022  Admitting diagnosis: Vaginal bleeding in pregnancy, third trimester [O46.93] Intrauterine pregnancy: [redacted]w[redacted]d    Secondary diagnosis:  Principal Problem:   Status post repeat low transverse cesarean section Active Problems:   Placenta abruption, delivered   Vaginal bleeding in pregnancy, third trimester   No prenatal care in current pregnancy   Fetal malpresentation   Preterm labor in third trimester with preterm delivery  Additional problems: ppHTN    Discharge diagnosis: Preterm Pregnancy Delivered                                              Post partum procedures: Augmentin for pyelo tx Augmentation: N/A Complications: [facility]ac[facility]: Sceduled C/S   27y.o. yo [id number] admitted to the hospital 05/23/2022 following presentation to the MAU with vaginal bleeding, preterm labor and possible PPROM, on a known background history of placenta abruption. Given advanced labor, active bleeding, placenta abruption and breech presentation, she was consented and planned for an urgent cesarean section. Delivery details are as follows:  Membrane Rupture Time/Date: 3:28 AM ,05/23/2022   Delivery Method:C-Section, Low Transverse  Details of operation can be found in separate operative note.  Patient had a postpartum course complicated by continued tx for pyelo, given augmentin. Pt also had elevated BP, started on lasix.  She is ambulating, tolerating a regular diet, passing flatus, and urinating well. Patient is discharged home in stable condition on  05/26/22        Newborn Data: Birth date:05/23/2022  Birth time:3:30 AM  Gender:Female  Living status:Living  Apgars:3 ,9  Weight:1980 g     Magnesium Sulfate received: No,  BMZ received: No;  however, did receive Magnesium sulfate within the last 2 weeks, in an earlier hospitalization for placental abruption Rhophylac:N/A MMR:N/A, Rubella Immune T-DaP: no Flu: Yes Transfusion:No  Physical exam  Vitals:   05/25/22 0800 05/25/22 1710 05/25/22 1947 05/26/22 0416  BP: 133/70 133/76 126/77 130/76  Pulse: 63 70 80 64  Resp: 18 18 18 18  $ Temp: 97.9 F (36.6 C) 98.7 F (37.1 C) 98.8 F (37.1 C) 98 F (36.7 C)  TempSrc: Oral Oral Oral Oral  SpO2: 100% 100% 98% 99%  Weight:      Height:       General: alert, cooperative, and no distress Lochia: appropriate Uterine Fundus: firm Incision: Healing well with no significant drainage, No significant erythema, Dressing is clean, dry, and intact DVT Evaluation: No evidence of DVT seen on physical exam. Labs: Lab Results  Component Value Date   WBC 13.2 (H) 05/24/2022   HGB 8.8 (L) 05/24/2022   HCT 25.8 (L) 05/24/2022   MCV 92.1 05/24/2022   PLT 251 05/24/2022      Latest Ref Rng & Units 05/23/2022    2:36 AM  CMP  Glucose 70 - 99 mg/dL 92   BUN 6 - 20 mg/dL 6   Creatinine 0.44 - 1.00 mg/dL 0.58   Sodium 135 - 145 mmol/L 136   Potassium 3.5 - 5.1 mmol/L 3.8   Chloride 98 - 111 mmol/L 104   CO2 22 - 32 mmol/L 21   Calcium 8.9 - 10.3 mg/dL 9.4  Total Protein 6.5 - 8.1 g/dL 6.7   Total Bilirubin 0.3 - 1.2 mg/dL 0.1   Alkaline Phos 38 - 126 U/L 85   AST 15 - 41 U/L 29   ALT 0 - 44 U/L 12    Edinburgh Score:    05/23/2022   11:51 PM  Edinburgh Postnatal Depression Scale Screening Tool  I have been able to laugh and see the funny side of things. 0  I have looked forward with enjoyment to things. 0  I have blamed myself unnecessarily when things went wrong. 0  I have been anxious or worried for no good reason. 0  I have felt scared or panicky for no good reason. 0  Things have been getting on top of me. 1  I have been so unhappy that I have had difficulty sleeping. 0  I have felt sad or miserable. 1  I have been  so unhappy that I have been crying. 1  The thought of harming myself has occurred to me. 0  Edinburgh Postnatal Depression Scale Total 3     After visit meds:  Allergies as of 05/26/2022   No Known Allergies      Medication List     STOP taking these medications    amoxicillin-clavulanate 875-125 MG tablet Commonly known as: AUGMENTIN   aspirin EC 81 MG tablet   Blood Pressure Kit Devi   cephALEXin 500 MG capsule Commonly known as: Keflex   Gojji Weight Scale Misc       TAKE these medications    acetaminophen 500 MG tablet Commonly known as: TYLENOL Take 2 tablets (1,000 mg total) by mouth every 6 (six) hours. What changed:  medication strength how much to take when to take this reasons to take this   ferrous sulfate 325 (65 FE) MG tablet Commonly known as: FerrouSul Take 1 tablet (325 mg total) by mouth every other day.   furosemide 20 MG tablet Commonly known as: Lasix Take 1 tablet (20 mg total) by mouth 2 (two) times daily for 5 days.   ibuprofen 600 MG tablet Commonly known as: ADVIL Take 1 tablet (600 mg total) by mouth every 6 (six) hours.   oxyCODONE 5 MG immediate release tablet Commonly known as: Oxy IR/ROXICODONE Take 1-2 tablets (5-10 mg total) by mouth every 4 (four) hours as needed for moderate pain.   prenatal vitamin w/FE, FA 27-1 MG Tabs tablet Take 1 tablet by mouth daily at 12 noon.   senna-docusate 8.6-50 MG tablet Commonly known as: Senokot-S Take 2 tablets by mouth daily.               Discharge Care Instructions  (From admission, onward)           Start     Ordered   05/26/22 0000  Discharge wound care:       Comments: C-section wound care: You may feel pain/discomfort/burning sensation for several weeks. Keep the wound area clean by washing it with mild soap and water. You don't need to scrub it. Often, just letting the water run over your wound in the shower is enough. We do not recommend creams as this can  cause infection, but we do recommend oral medication (prescribed), pressure dressings and running warm water on the wound to help ease discomfort/burning pain.   05/26/22 0904             Discharge home in stable condition Infant Feeding: Bottle and Breast Infant Disposition:NICU Discharge instruction: per After  Visit Summary and Postpartum booklet. Activity: Advance as tolerated. Pelvic rest for 6 weeks.  Diet: routine diet Future Appointments: Future Appointments  Date Time Provider [place]  05/30/2022  1:20 PM [place] None  07/04/2022  1:10 PM Cresenzo, Angelyn Punt, MD [facility] None   Follow up Visit:  The following message was sent to [employer] by Mikki Santee, MD  Please schedule this patient for a In person postpartum visit in 6 weeks with the following provider: MD. Additional Postpartum F/U:Incision check 1 week BP check 1 week. High risk pregnancy complicated by:  placenta abruption, bleeding, preterm labor Delivery mode:  C-Section, Low Transverse  Anticipated Birth Control:  PP IUD placed   05/26/2022 Shelda Pal, DO

## 2022-05-23 NOTE — MAU Note (Signed)
Hansel Feinstein CNM at bedside to see pt

## 2022-05-23 NOTE — MAU Provider Note (Signed)
Chief Complaint:  Vaginal Bleeding   Event Date/Time   First Provider Initiated Contact with Patient 05/23/22 0210     HPI: Holly Greene is a 27 y.o. [id number] at 67w4dho presents to maternity admissions reporting bleeding and contractions tonight. Has been bleeding off and on since leaving hospital on 05/14/22   Tonight passed a large clot and bleeding was heavier. Missed her [place] yesterday.. She reports good fetal movement, denies LOF, urinary symptoms, h/a, dizziness, n/v, diarrhea, constipation or fever/chills.    Vaginal Bleeding The patient's primary symptoms include pelvic pain and vaginal bleeding. The patient's pertinent negatives include no genital itching or genital odor. This is a recurrent problem. She is pregnant. Pertinent negatives include no chills, constipation, diarrhea, dysuria, fever or nausea. The vaginal discharge was bloody. The vaginal bleeding is heavier than menses. She has been passing clots. She has not been passing tissue. Nothing aggravates the symptoms. She has tried nothing for the symptoms.   RN note: JRavan Wolskyis a 27y.o. at 369w4dere in MAU reporting VB at 0130 when she went to [place]. States passed a softball size clot. Having some ctxs but not really painful. Reports good FM before VB but has not felt much since then.   Onset of complaint: 0130 Pain score: 6  Past Medical History: Past Medical History:  Diagnosis Date   Asthma    as a child   [place] 1 [place]   Defiant behavior    Hypertension    Knee dislocation     Past obstetric history: OB History  Gravida Para Term Preterm AB Living  5 3 3   1 3  $ SAB IAB Ectopic Multiple Live Births    1   0 3    # Outcome Date GA Lbr Len/2nd Weight Sex Delivery Anes PTL Lv  5 Current           4 Term 08/04/21 3948w4d:00 / 00:32 3370 g M Vag-Spont EPI  LIV  3 IAB 2022          2 Term 06/29/17 38w68w5d05 g M CS-LTranv EPI  LIV  1 Term 07/27/16 38w446w4d6 / 01:13 3442 g F Vag-Spont EPI   LIV    Past Surgical History: Past Surgical History:  Procedure Laterality Date   CESAREAN SECTION N/A 06/29/2017   Procedure: CESAREAN SECTION;  Surgeon: ConstMora Bellman  [facility] [place];  Laterality: N/A;   TONSILLECTOMY      Family History: Family History  Problem Relation Age of Onset   Diabetes Mother    Hypertension Mother    Diabetes Maternal Grandmother    Hypertension Maternal Grandmother     Social History: Social History   Tobacco Use   Smoking status: Never   Smokeless tobacco: Never  Vaping Use   Vaping Use: Never used  Substance Use Topics   Alcohol use: No    Comment: not while preg   Drug use: Not Currently    Frequency: 7.0 times per week    Types: Marijuana    Comment: None in last week    Allergies: No Known Allergies  Meds:  Medications Prior to Admission  Medication Sig Dispense Refill Last Dose   acetaminophen (TYLENOL) 325 MG tablet Take 2 tablets (650 mg total) by mouth every 4 (four) hours as needed (for pain scale < 4). 30 tablet 0    amoxicillin-clavulanate (AUGMENTIN) 875-125 MG tablet Take 1 tablet by mouth 2 (  two) times daily. 20 tablet 1    aspirin EC 81 MG tablet Take 1 tablet (81 mg total) by mouth daily. Take daily until delivery for preeclampsia prevention (Patient not taking: Reported on 03/28/2022) 30 tablet 12    Blood Pressure Monitoring (BLOOD PRESSURE KIT) DEVI 1 kit by Does not apply route once a week. 1 each 0    cephALEXin (KEFLEX) 500 MG capsule Take 1 capsule (500 mg total) by mouth at bedtime. Take daily to suppress UTI for rest of pregnancy. Start this after Augmentin therapy. 30 capsule 2    ferrous sulfate (FERROUSUL) 325 (65 FE) MG tablet Take 1 tablet (325 mg total) by mouth every other day. 30 tablet 3    Misc. Devices (GOJJI WEIGHT SCALE) MISC 1 Device by Does not apply route every 30 (thirty) days. 1 each 0    prenatal vitamin w/FE, FA (PRENATAL 1 + 1) 27-1 MG TABS tablet  Take 1 tablet by mouth daily at 12 noon.       I have reviewed patient's Past Medical Hx, Surgical Hx, Family Hx, Social Hx, medications and allergies.   ROS:  Review of Systems  Constitutional:  Negative for chills and fever.  Gastrointestinal:  Negative for constipation, diarrhea and nausea.  Genitourinary:  Positive for pelvic pain and vaginal bleeding. Negative for dysuria.   Other systems negative  Physical Exam  No data found. Constitutional: Well-developed, well-nourished female in no acute distress.  Cardiovascular: normal rate and rhythm Respiratory: normal effort GI: Abd soft, non-tender, gravid appropriate for gestational age.   No rebound or guarding. MS: Extremities nontender, no edema, normal ROM Neurologic: Alert and oriented x 4.  GU: Neg CVAT.  PELVIC EXAM:  Large amount of blood flowing from vagina, moreso with contractions.  Cervix 5+cm with bulging membranes  FHT:  Baseline 140 , moderate variability, accelerations present, no decelerations Contractions:  Irregular     Labs:  --/--/O POS (02/05 0429)  Imaging:  Pt informed that the ultrasound is considered a limited OB ultrasound and is not intended to be a complete ultrasound exam.  Patient also informed that the ultrasound is not being completed with the intent of assessing for fetal or placental anomalies or any pelvic abnormalities.  Explained that the purpose of today's ultrasound is to assess for presentation.  Patient acknowledges the purpose of the exam and the limitations of the study.    Fetus is in the longitudinal lie Breech presentation with head in maternal left upper quadrant  MAU Course/MDM: I have reviewed the triage vital signs and the nursing notes.   Pertinent labs & imaging results that were available during my care of the patient were reviewed by me and considered in my medical decision making (see chart for details).      I have reviewed her medical records including past results,  notes and treatments.   I have ordered labs and preoperative orders. NST reviewed Consult Dr Harolyn Rutherford who is at bedside with presentation, exam findings and test results.  Treatments in MAU included SSE, IV.    Assessment: Single IUP at 66w4dVaginal bleeding in third trimester Presumed abruption  Preterm labor  Plan: Prepare for OR Plan Cesarean Delivery Anesthesiologist made aware  MHansel FeinsteinCNM, MSN Certified Nurse-Midwife 05/23/2022 2:10 AM

## 2022-05-23 NOTE — H&P (Signed)
Chief Complaint:  Vaginal Bleeding    Event Date/Time   First Provider Initiated Contact with Patient 05/23/22 0210     HPI: Holly Greene is a 27 y.o. [id number] at 23w4dho presents to maternity admissions reporting bleeding and contractions tonight. Has been bleeding off and on since leaving hospital on 05/14/22   Tonight passed a large clot and bleeding was heavier. Missed her [place] yesterday.. She reports good fetal movement, denies LOF, urinary symptoms, h/a, dizziness, n/v, diarrhea, constipation or fever/chills.     Vaginal Bleeding The patient's primary symptoms include pelvic pain and vaginal bleeding. The patient's pertinent negatives include no genital itching or genital odor. This is a recurrent problem. She is pregnant. Pertinent negatives include no chills, constipation, diarrhea, dysuria, fever or nausea. The vaginal discharge was bloody. The vaginal bleeding is heavier than menses. She has been passing clots. She has not been passing tissue. Nothing aggravates the symptoms. She has tried nothing for the symptoms.    RN note: Holly Jolliffeis a 27y.o. at 333w4dere in MAU reporting VB at 0130 when she went to [place]. States passed a softball size clot. Having some ctxs but not really painful. Reports good FM before VB but has not felt much since then.   Onset of complaint: 0130 Pain score: 6   Past Medical History:     Past Medical History:  Diagnosis Date   Asthma      as a child   [place] 1 disorder [place]    Defiant behavior     Hypertension     Knee dislocation        Past obstetric history:                 OB History  Gravida Para Term Preterm AB Living  5 3 3   1 3  $ SAB IAB Ectopic Multiple Live Births       1   0 3        # Outcome Date GA Lbr Len/2nd Weight Sex Delivery Anes PTL Lv  5 Current                    4 Term 08/04/21 3984w4d:00 / 00:32 3370 g M Vag-Spont EPI   LIV  3 IAB 2022                  2 Term 06/29/17 38w26w5d805 g M CS-LTranv EPI   LIV   1 Term 07/27/16 38w433w4d6 / 01:13 3442 g F Vag-Spont EPI   LIV      Past Surgical History:      Past Surgical History:  Procedure Laterality Date   CESAREAN SECTION N/A 06/29/2017    Procedure: CESAREAN SECTION;  Surgeon: ConstMora Bellman  [place] [place] [place];  Laterality: N/A;   TONSILLECTOMY          Family History:      Family History  Problem Relation Age of Onset   Diabetes Mother     Hypertension Mother     Diabetes Maternal Grandmother     Hypertension Maternal Grandmother        Social History: Social History         Tobacco Use   Smoking status: Never   Smokeless tobacco: Never  Vaping Use   Vaping Use: Never used  Substance Use Topics   Alcohol use: No      Comment: not while preg  Drug use: Not Currently      Frequency: 7.0 times per week      Types: Marijuana      Comment: None in last week      Allergies: No Known Allergies   Meds:         Medications Prior to Admission  Medication Sig Dispense Refill Last Dose   acetaminophen (TYLENOL) 325 MG tablet Take 2 tablets (650 mg total) by mouth every 4 (four) hours as needed (for pain scale < 4). 30 tablet 0     amoxicillin-clavulanate (AUGMENTIN) 875-125 MG tablet Take 1 tablet by mouth 2 (two) times daily. 20 tablet 1     aspirin EC 81 MG tablet Take 1 tablet (81 mg total) by mouth daily. Take daily until delivery for preeclampsia prevention (Patient not taking: Reported on 03/28/2022) 30 tablet 12     Blood Pressure Monitoring (BLOOD PRESSURE KIT) DEVI 1 kit by Does not apply route once a week. 1 each 0     cephALEXin (KEFLEX) 500 MG capsule Take 1 capsule (500 mg total) by mouth at bedtime. Take daily to suppress UTI for rest of pregnancy. Start this after Augmentin therapy. 30 capsule 2     ferrous sulfate (FERROUSUL) 325 (65 FE) MG tablet Take 1 tablet (325 mg total) by mouth every other day. 30 tablet 3     Misc. Devices (GOJJI WEIGHT SCALE) MISC 1 Device by Does  not apply route every 30 (thirty) days. 1 each 0     prenatal vitamin w/FE, FA (PRENATAL 1 + 1) 27-1 MG TABS tablet Take 1 tablet by mouth daily at 12 noon.            I have reviewed patient's Past Medical Hx, Surgical Hx, Family Hx, Social Hx, medications and allergies.    ROS:  Review of Systems  Constitutional:  Negative for chills and fever.  Gastrointestinal:  Negative for constipation, diarrhea and nausea.  Genitourinary:  Positive for pelvic pain and vaginal bleeding. Negative for dysuria.    Other systems negative   Physical Exam  No data found. Constitutional: Well-developed, well-nourished female in no acute distress.  Cardiovascular: normal rate and rhythm Respiratory: normal effort GI: Abd soft, non-tender, gravid appropriate for gestational age.   No rebound or guarding. MS: Extremities nontender, no edema, normal ROM Neurologic: Alert and oriented x 4.  GU: Neg CVAT.   PELVIC EXAM:  Large amount of blood flowing from vagina, moreso with contractions.  Cervix 5+cm with bulging membranes   FHT:  Baseline 140 , moderate variability, accelerations present, no decelerations Contractions:  Irregular     Labs:   --/--/O POS (02/05 0429)   Imaging:  Pt informed that the ultrasound is considered a limited OB ultrasound and is not intended to be a complete ultrasound exam.  Patient also informed that the ultrasound is not being completed with the intent of assessing for fetal or placental anomalies or any pelvic abnormalities.  Explained that the purpose of today's ultrasound is to assess for presentation.  Patient acknowledges the purpose of the exam and the limitations of the study.     Fetus is in the longitudinal lie Breech presentation with head in maternal left upper quadrant   MAU Course/MDM: I have reviewed the triage vital signs and the nursing notes.   Pertinent labs & imaging results that were available during my care of the patient were reviewed by me and  considered in my medical decision making (see chart for  details).      I have reviewed her medical records including past results, notes and treatments.    I have ordered labs and preoperative orders. NST reviewed Consult Dr Harolyn Rutherford who is at bedside with presentation, exam findings and test results.  Treatments in MAU included SSE, IV.     Assessment: Single IUP at 66w4dVaginal bleeding in third trimester Presumed abruption  Preterm labor   Plan: Prepare for OR Plan Cesarean Delivery Anesthesiologist made aware   MHansel FeinsteinCNM, MSN Certified Nurse-Midwife 05/23/2022 2:10 AM

## 2022-05-23 NOTE — Anesthesia Preprocedure Evaluation (Addendum)
Anesthesia Evaluation  Patient identified by MRN, date of birth, ID band Patient awake    Reviewed: Allergy & Precautions, NPO status , Patient's Chart, lab work & pertinent test results  Airway Mallampati: II  TM Distance: >3 FB Neck ROM: Full    Dental no notable dental hx. (+) Teeth Intact, Dental Advisory Given   Pulmonary asthma    Pulmonary exam normal breath sounds clear to auscultation       Cardiovascular hypertension, Normal cardiovascular exam Rhythm:Regular Rate:Normal     Neuro/Psych  Neuromuscular disease    GI/Hepatic negative GI ROS, Neg liver ROS,,,  Endo/Other    Renal/GU negative Renal ROS     Musculoskeletal   Abdominal   Peds  Hematology Lab Results      Component                Value               Date                      WBC                      11.8 (H)            05/23/2022                HGB                      11.5 (L)            05/23/2022                HCT                      35.5 (L)            05/23/2022                MCV                      94.2                05/23/2022                PLT                      337                 05/23/2022                PLT                      312                 05/23/2022              Anesthesia Other Findings   Reproductive/Obstetrics (+) Pregnancy                             Anesthesia Physical Anesthesia Plan  ASA: 2  Anesthesia Plan: Spinal   Post-op Pain Management: Regional block* and Minimal or no pain anticipated   Induction:   PONV Risk Score and Plan: 3 and Treatment may vary due to age or medical condition and Ondansetron  Airway Management Planned: Nasal Cannula and Natural Airway  Additional Equipment: None  Intra-op Plan:  Post-operative Plan:   Informed Consent: I have reviewed the patients History and Physical, chart, labs and discussed the procedure including the risks, benefits  and alternatives for the proposed anesthesia with the patient or authorized representative who has indicated his/her understanding and acceptance.     Dental advisory given  Plan Discussed with: CRNA and Anesthesiologist  Anesthesia Plan Comments: (32.4 wk G5P3 w  abrupto placenta, Breech for rpt C/S under Spinal)       Anesthesia Quick Evaluation

## 2022-05-23 NOTE — MAU Note (Addendum)
Dr Harolyn Rutherford in earlier at [place] to see pt. U/S done and confirmed breech by MD. Dr Rozetta Nunnery in to see pt and discuss anesthesia. Pt desire IUD placement at time of delivery. Consent signed

## 2022-05-23 NOTE — MAU Note (Addendum)
.  Holly Greene is a 27 y.o. at [redacted]w[redacted]d here in MAU reporting VB at 0130 when she went to [place]. States passed a softball size clot. Having some ctxs but not really painful. Reports good FM before VB but has not felt much since then.PT arrived by EMS and taken directly to 121.    Onset of complaint: 0130 Pain score: 6 There were no vitals filed for this visit.   FHT:152 Lab orders placed from triage:  none

## 2022-05-23 NOTE — Progress Notes (Signed)
To OR via stretcher. FHTs 150 when EFM dc for transfer to OR.

## 2022-05-23 NOTE — Transfer of Care (Signed)
Immediate Anesthesia Transfer of Care Note  Patient: Holly Greene  Procedure(s) Performed: CESAREAN SECTION (Uterus) INTRAUTERINE DEVICE (IUD) INSERTION (Bilateral: Uterus)  Patient Location: PACU  Anesthesia Type:Spinal  Level of Consciousness: awake, alert , oriented, and sedated  Airway & Oxygen Therapy: Patient Spontanous Breathing  Post-op Assessment: Report given to RN and Post -op Vital signs reviewed and stable  Post vital signs: Reviewed and stable  Last Vitals:  Vitals Value Taken Time  BP 122/83 05/23/22 0502  Temp 36.7 C 05/23/22 0500  Pulse 76 05/23/22 0508  Resp 18 05/23/22 0508  SpO2 99 % 05/23/22 0508  Vitals shown include unvalidated device data.  Last Pain:  Vitals:   05/23/22 0500  PainSc: 0-No pain      Patients Stated Pain Goal: 0 ([id number])  Complications: No notable events documented.

## 2022-05-23 NOTE — Anesthesia Postprocedure Evaluation (Signed)
Anesthesia Post Note  Patient: Holly Greene  Procedure(s) Performed: CESAREAN SECTION (Uterus) INTRAUTERINE DEVICE (IUD) INSERTION (Bilateral: Uterus)     Patient location during evaluation: Mother Baby Anesthesia Type: Spinal Level of consciousness: oriented and awake and alert Pain management: pain level controlled Vital Signs Assessment: post-procedure vital signs reviewed and stable Respiratory status: spontaneous breathing and respiratory function stable Cardiovascular status: blood pressure returned to baseline and stable Postop Assessment: no headache, no backache, no apparent nausea or vomiting and able to ambulate Anesthetic complications: no  No notable events documented.  Last Vitals:  Vitals:   05/23/22 0545 05/23/22 0600  BP: 121/76 126/79  Pulse: 72 76  Resp: 15 15  Temp:    SpO2: 94% 98%    Last Pain:  Vitals:   05/23/22 0615  PainSc: 0-No pain   Pain Goal: Patients Stated Pain Goal: 0 (05/23/22 0210)  LLE Motor Response: Purposeful movement (05/23/22 0600) LLE Sensation: Tingling, Increased (05/23/22 0600) RLE Motor Response: Purposeful movement (05/23/22 0600) RLE Sensation: Tingling, Increased (05/23/22 0600)     Epidural/Spinal Function Cutaneous sensation: Vague (05/23/22 0615), Patient able to flex knees: Yes (05/23/22 0615), Patient able to lift hips off bed: Yes (05/23/22 0615), Back pain beyond tenderness at insertion site: No (05/23/22 0615), Progressively worsening motor and/or sensory loss: No (05/23/22 0615), Bowel and/or bladder incontinence post epidural: No (05/23/22 0615)  Barnet Glasgow

## 2022-05-24 LAB — CBC
HCT: 25.8 % — ABNORMAL LOW (ref 36.0–46.0)
Hemoglobin: 8.8 g/dL — ABNORMAL LOW (ref 12.0–15.0)
MCH: 31.4 pg (ref 26.0–34.0)
MCHC: 34.1 g/dL (ref 30.0–36.0)
MCV: 92.1 fL (ref 80.0–100.0)
Platelets: 251 K/uL (ref 150–400)
RBC: 2.8 MIL/uL — ABNORMAL LOW (ref 3.87–5.11)
RDW: 12.6 % (ref 11.5–15.5)
WBC: 13.2 K/uL — ABNORMAL HIGH (ref 4.0–10.5)
nRBC: 0 % (ref 0.0–0.2)

## 2022-05-24 LAB — SURGICAL PATHOLOGY

## 2022-05-24 LAB — RPR: RPR Ser Ql: NONREACTIVE

## 2022-05-24 NOTE — Progress Notes (Signed)
POSTPARTUM PROGRESS NOTE  POD #1  Subjective:  Holly Greene is a 27 y.o. [id number] s/p rLTCS at 57w4ddue to placental abruption and fetal malpresentation.  She reports she doing well. No acute events overnight. She denies any problems with ambulating, voiding or po intake. Denies nausea or vomiting. She has passed flatus. Pain is well controlled.  Lochia is less than menses.  Objective: Blood pressure 120/70, pulse 72, temperature 98 F (36.7 C), temperature source Oral, resp. rate 16, height 5' 6"$  (1.676 m), weight 94.2 kg, last menstrual period 10/07/2021, SpO2 100 %, unknown if currently breastfeeding.  Physical Exam:  General: alert, cooperative and no distress Chest: no respiratory distress Heart:regular rate, distal pulses intact Abdomen: soft, nontender,  Uterine Fundus: firm, appropriately tender DVT Evaluation: No calf swelling or tenderness Extremities: no edema Skin: warm, dry; incision clean/dry/intact w/ dressing in place  Recent Labs    05/23/22 0236 05/23/22 1000  HGB 11.5* 10.1*  HCT 35.5* 30.6*    Assessment/Plan: JCheridan Nawabiis a 27y.o. [id number]/p rLTCS at 346w4dor abruption and malpresentation.  POD#1 - Doing welll; pain well controlled.  Routine postpartum care  OOB, ambulated  Lovenox for VTE prophylaxis Anemia: asymptomatic  Repeat CBC pending Pyelonephritis: Continue Augmentin BID, stop date 2/16 Contraception: post-placental IUD placed intra-operatively Feeding: formula  Dispo: Plan for discharge POD#3.   LOS: 1 day   LyDarlen RoundMD PGY-1 05/24/2022, 7:38 AM

## 2022-05-25 ENCOUNTER — Encounter (HOSPITAL_COMMUNITY): Payer: Self-pay | Admitting: Obstetrics & Gynecology

## 2022-05-25 NOTE — Progress Notes (Signed)
CSW attempted to meet with [name] to complete psychosocial assessment; however, [name] was sleepy. CSW agreed to return at a later time.  Abundio Miu, [place] Worker [facility] Cell#: [phone]

## 2022-05-25 NOTE — Progress Notes (Signed)
POSTPARTUM PROGRESS NOTE  POD #2  Subjective:  Holly Greene is a 27 y.o. [id number] s/p rLTCS at 72w4ddue to placental abruption and fetal malpresentation.  She reports she doing well, though still has some pain. No acute events overnight. She denies any problems with ambulating, voiding or po intake. Denies nausea or vomiting. She has passed flatus. Pain is well controlled.  Lochia is less than menses.  Objective: Blood pressure 131/78, pulse 85, temperature 98.2 F (36.8 C), temperature source Oral, resp. rate 18, height 5' 6"$  (1.676 m), weight 94.2 kg, last menstrual period 10/07/2021, SpO2 100 %, unknown if currently breastfeeding.  Physical Exam:  General: alert, cooperative and no distress Chest: no respiratory distress Heart:regular rate, distal pulses intact Abdomen: soft, nontender,  Uterine Fundus: firm, appropriately tender DVT Evaluation: No calf swelling or tenderness Extremities: mild edema Skin: warm, dry; incision clean/dry/intact w/ dressing in place  Recent Labs    05/23/22 1000 05/24/22 0736  HGB 10.1* 8.8*  HCT 30.6* 25.8*    Assessment/Plan: Holly Wolfendenis a 27y.o. [id number]/p rLTCS at 391w4dor abruption and malpresentation.  POD#2 - Doing welll; pain well controlled.  Routine postpartum care  OOB, ambulated  Lovenox for VTE prophylaxis Anemia: asymptomatic  Ferrous sulfate QOD Pyelonephritis: Continue Augmentin BID, stop date 2/16 Contraception: post-placental IUD placed intra-operatively Feeding: formula  Dispo: Plan for discharge POD#3.   LOS: 2 days   Holly PalDO FMChappaquaellow, Faculty practice Co[place]lemanor WoRiley2/16/24  8:34 AM

## 2022-05-25 NOTE — Clinical Social Work Maternal (Signed)
CLINICAL SOCIAL WORK MATERNAL/CHILD NOTE  Patient Details  Name: Holly Greene MRN: [id number] Date of Birth: 1995-11-13  Date:  05/25/2022  Clinical Social Worker Initiating Note:  Abundio Miu, [place] Date/Time: Initiated:  05/25/22/1516     Child's Name:  Gillermo Murdoch   Biological Parents:  Mother, Father (Father: Ed Blalock)   Need for Interpreter:  None   Reason for Referral:  Behavioral Health Concerns, Late or No Prenatal Care  , Parental Support of Premature Babies < 39 weeks/or Critically Ill babies, Current Substance Use/Substance Use During Pregnancy     Address:  [address] [place] [address]    Phone number:  [phone] (home)     Additional phone number:   Household Members/Support Persons (HM/SP):   Household Member/Support Person 1, Household Member/Support Person 2, Household Member/Support Person 3   HM/SP Name Relationship DOB or Age  HM/SP -1 Kirby Funk daughter 07/27/16  HM/SP -2 Geoffery Lyons son 06/29/17  HM/SP -3 Carita Pian son 08/04/21  HM/SP -4        HM/SP -5        HM/SP -6        HM/SP -7        HM/SP -8          Natural Supports (not living in the home):  Parent   Professional Supports: None   Employment: Unemployed   Type of Work:     Education:  9 to 11 years (11th Grade)   Homebound arranged: No  Financial Resources:  [employer]   Other Resources:  Arts development officer Considerations Which May Impact Care:    Strengths:  Ability to meet basic needs  , Engineer, materials, Home prepared for child  , Understanding of illness   Psychotropic Medications:         Pediatrician:    Solicitor area  Pediatrician List:   [place]      Pediatrician Fax Number:    Risk Factors/Current Problems:  Substance Use  , Mental Health Concerns  ,  Transportation     Cognitive State:  Alert  , Able to Concentrate  , Goal Oriented  , Insightful  , Linear Thinking     Mood/Affect:  Calm  , Interested  , Comfortable  , Relaxed     CSW Assessment: CSW met with [name] at bedside to complete psychosocial assessment. CSW re-introduced self and explained role. [name] was welcoming, pleasant, open, talkative, and remained engaged during assessment. [name] reported that she resides with her three older children. [name] reported that she plans to return to her job at [employer] in April. [name] reported that she has started to get some items needed for infant including clothes and a basinet. [name] reported that FOB will be getting a car seat today. [name] reported that they will be able to get all items needed to care for infant. [name] reported that she receives food stamps and is interested in [facility]. CSW agreed to complete a [facility] referral, [name] was agreeable. CSW inquired about [name]'s support system, [name] reported that her mom and boyfriend/FOB are supports.   CSW inquired about [name]'s mental health history. [name] reported that she was diagnosed with anxiety and depression in fifth grade. [name] reported that she participated in therapy in the past, which was helpful. [name]  reported that she also took medication in the past (Abilify & Trileptal). [name] reported that the medication was a little helpful. CSW asked if the Abilify was prescribed for Bipolar Disorder as [name] has a diagnosis of Bipolar in her chart. [name] reported no and shared that it was prescribed for depression and the Trileptal was prescribed for Bipolar Disorder. [name] described her Bipolar Disorder as having mood swings and anger issues. [name] denied any AVH in association with her Bipolar Disorder. [name] denied any current symptoms of Bipolar Disorder and Depression. [name] endorsed symptoms of anxiety and reported having anxiety during delivery. [name] described some of her symptoms of anxiety as increased heart rate and feeling like she is going  to pass out. [name] shared that she experiences social anxiety triggered by big crowds. CSW inquired about how [name] copes with symptoms of anxiety, [name] reported that she likes to have plans/goals. [name] explained that having a back up plan makes her feel better. [name] shared that she also likes to vent to FOB. CSW positively affirmed [name]'s healthy coping skills. 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 [name] shared about how stressors due to that relationship impacted her PPD. [name] reported that she did not take any medication nor participate in therapy to treat PPD. [name] shared about ending that relationship and relocation, which helped her symptoms of PPD. [name] denied having PPD after her last pregnancy. [name] denied any additional mental health history. CSW inquired about how [name] was feeling emotionally since giving birth, [name] reported that she was feeling good and also overwhelmed as she is in the process of moving. CSW acknowledged, validated, and normalized [name]'s feelings. [name] presented calm and did not demonstrate any acute mental health signs/symptoms. CSW assessed for safety, [name] denied SI, HI, and domestic violence.   CSW provided education regarding the baby blues period vs. perinatal mood disorders, discussed treatment and gave resources for mental health follow up if concerns arise.  CSW recommends self-evaluation during the postpartum time period using the New Mom Checklist from Postpartum Progress and encouraged [name] to contact a medical professional if symptoms are noted at any time.    CSW provided review of Sudden Infant Death Syndrome (SIDS) precautions.    CSW and [name] discussed infant's NICU admission. CSW informed [name] about the NICU, what to expect, and resources/supports available while infant is admitted to the NICU. [name] reported that she feels well informed about infant's care. [name] reported that she does  have transportation barriers with visiting infant in the NICU. CSW informed [name] about medicaid transportation, [name] reported that she plans to utilize medicaid transportation. CSW provided contact information for medicaid transportation. [name] denied any questions/concerns regarding the NICU.   CSW informed [name] about the hospital drug screen policy due to having no prenatal care. [name] confirmed having no prenatal care and reported transportation as a barrier. [name] reported that she can utilize medicaid transportation to get infant to pediatrician appointments. CSW inquired about any substance use during pregnancy. [name] reported that she smoked marijuana and reported last use as in January. [name] reported that she smoked marijuana for nausea and appetite. [name] denied any additional substance use. CSW informed [name] that infant's UDS was negative and CDS would continue to be monitored and a CPS report would be made if warranted. [name] verbalized understanding and reported [facility] CPS history in 2018 and 2019. [name] reported that the cases were closed.  CSW inquired about any needed resources/supports. [name] requested information on parenting classes. CSW informed [name] about the Healthy Start program, [name] reported that she is interested in a referral. CSW agreed to complete a referral. [name] requested therapy resources, CSW provided therapy resources. [name] thanked CSW and denied any additional needs.   CSW will continue to offer resources/supports while infant is admitted to the NICU as [name] opted for CSW to check in weekly.   CSW completed [facility] referral.   CSW will complete [facility] referral.   CSW Plan/Description:  Sudden Infant Death Syndrome (SIDS) Education, Perinatal Mood and Anxiety Disorder (PMADs) Education, [place], CSW Will Continue to Monitor Umbilical Cord Tissue Drug Screen Results and Make Report if Warranted, Psychosocial Support and Ongoing Assessment of Needs, Other  Information/Referral to [employer], [place] 05/25/2022, 3:21 PM

## 2022-05-26 ENCOUNTER — Other Ambulatory Visit: Payer: Self-pay | Admitting: Family Medicine

## 2022-05-26 DIAGNOSIS — Z98891 History of uterine scar from previous surgery: Secondary | ICD-10-CM

## 2022-05-26 MED ORDER — IBUPROFEN 600 MG PO TABS: 600.0000 mg | ORAL_TABLET | Freq: Four times a day (QID) | ORAL | 0 refills | Status: DC

## 2022-05-26 MED ORDER — OXYCODONE HCL 5 MG PO TABS: 5.0000 mg | ORAL_TABLET | ORAL | 0 refills | Status: DC | PRN

## 2022-05-26 MED ORDER — FUROSEMIDE 20 MG PO TABS: 20.0000 mg | ORAL_TABLET | Freq: Two times a day (BID) | ORAL | 0 refills | Status: AC

## 2022-05-26 MED ORDER — SENNOSIDES-DOCUSATE SODIUM 8.6-50 MG PO TABS: 2.0000 | ORAL_TABLET | Freq: Every day | ORAL | 0 refills | Status: DC

## 2022-05-26 MED ORDER — ACETAMINOPHEN 500 MG PO TABS: 1000.0000 mg | ORAL_TABLET | Freq: Four times a day (QID) | ORAL | 0 refills | Status: AC

## 2022-05-30 ENCOUNTER — Ambulatory Visit (INDEPENDENT_AMBULATORY_CARE_PROVIDER_SITE_OTHER): Payer: Medicaid Other

## 2022-05-30 VITALS — BP 130/89 | HR 73 | Wt 187.7 lb

## 2022-05-30 DIAGNOSIS — Z5189 Encounter for other specified aftercare: Secondary | ICD-10-CM

## 2022-05-30 DIAGNOSIS — Z0130 Encounter for examination of blood pressure without abnormal findings: Secondary | ICD-10-CM

## 2022-05-30 NOTE — Progress Notes (Signed)
..  Subjective:  Holly Greene is a 27 y.o. female here for BP check. Pt states that she has not picked up her Lasix from the pharmacy yet. Pt is also in office for incision check, pt previously removed honeycomb. Pt denies increased pain, drainage, or odor from icision.  Hypertension ROS: not taking medications regularly as instructed, no TIA's, no chest pain on exertion, no dyspnea on exertion, and no swelling of ankles.    Objective:  BP 136/89   Pulse 72   LMP 10/07/2021  Repeat BP 130/89, Pulse 73 Appearance alert, well appearing, and in no distress. General exam BP noted to be stable today in office. Steri strips still intact on incision. No excess drainage or odor noted.  Assessment:   Blood Pressure stable.    Plan:  Current treatment plan is effective, no change in therapy. Consulted with in office provider and advised Pt to return in 1 week for BP check, advised to pick up medications and take before next visit.  Pt will remove steri strips at home, advised to keep incision clean and dry, minimize pressure/lifting. Advised to call and report abnormal symptoms or return to [facility], pt agreed. PP visit is 07/04/22.

## 2022-05-31 ENCOUNTER — Encounter: Payer: Medicaid Other | Admitting: Obstetrics

## 2022-06-07 ENCOUNTER — Ambulatory Visit: Payer: Medicaid Other

## 2022-06-18 ENCOUNTER — Ambulatory Visit: Payer: Medicaid Other

## 2022-07-04 ENCOUNTER — Ambulatory Visit: Payer: Medicaid Other | Admitting: Family Medicine

## 2023-01-19 ENCOUNTER — Emergency Department (HOSPITAL_COMMUNITY)
Admission: EM | Admit: 2023-01-19 | Discharge: 2023-01-19 | Disposition: A | Discharge status: Home / Self Care | Payer: 59 | Attending: Emergency Medicine | Admitting: Emergency Medicine

## 2023-01-19 ENCOUNTER — Encounter (HOSPITAL_COMMUNITY): Payer: Self-pay | Admitting: *Deleted

## 2023-01-19 ENCOUNTER — Other Ambulatory Visit: Payer: Self-pay

## 2023-01-19 ENCOUNTER — Emergency Department (HOSPITAL_COMMUNITY): Payer: 59

## 2023-01-19 DIAGNOSIS — N730 Acute parametritis and pelvic cellulitis: Secondary | ICD-10-CM

## 2023-01-19 DIAGNOSIS — M461 Sacroiliitis, not elsewhere classified: Secondary | ICD-10-CM | POA: Diagnosis not present

## 2023-01-19 DIAGNOSIS — I10 Essential (primary) hypertension: Secondary | ICD-10-CM | POA: Diagnosis not present

## 2023-01-19 DIAGNOSIS — R1030 Lower abdominal pain, unspecified: Secondary | ICD-10-CM | POA: Diagnosis present

## 2023-01-19 LAB — COMPREHENSIVE METABOLIC PANEL
ALT: 14 U/L (ref 0–44)
AST: 37 U/L (ref 15–41)
Albumin: 3.6 g/dL (ref 3.5–5.0)
Alkaline Phosphatase: 52 U/L (ref 38–126)
Anion gap: 10 (ref 5–15)
BUN: 11 mg/dL (ref 6–20)
CO2: 25 mmol/L (ref 22–32)
Calcium: 9.2 mg/dL (ref 8.9–10.3)
Chloride: 105 mmol/L (ref 98–111)
Creatinine, Ser: 0.97 mg/dL (ref 0.44–1.00)
GFR, Estimated: >60 mL/min (ref >60)
Glucose, Bld: 130 mg/dL — ABNORMAL HIGH (ref 70–99)
Potassium: 3.7 mmol/L (ref 3.5–5.1)
Sodium: 140 mmol/L (ref 135–145)
Total Bilirubin: 0.4 mg/dL (ref 0.3–1.2)
Total Protein: 6.6 g/dL (ref 6.5–8.1)

## 2023-01-19 LAB — URINALYSIS, ROUTINE W REFLEX MICROSCOPIC
Bilirubin Urine: NEGATIVE
Glucose, UA: NEGATIVE mg/dL
Hgb urine dipstick: NEGATIVE
Ketones, ur: NEGATIVE mg/dL
Leukocytes,Ua: NEGATIVE
Nitrite: POSITIVE — AB
Protein, ur: NEGATIVE mg/dL
Specific Gravity, Urine: 1.02 (ref 1.005–1.030)
pH: 6 (ref 5.0–8.0)

## 2023-01-19 LAB — CBC
HCT: 37.8 % (ref 36.0–46.0)
Hemoglobin: 12.5 g/dL (ref 12.0–15.0)
MCH: 30.5 pg (ref 26.0–34.0)
MCHC: 33.1 g/dL (ref 30.0–36.0)
MCV: 92.2 fL (ref 80.0–100.0)
Platelets: 230 10*3/uL (ref 150–400)
RBC: 4.1 MIL/uL (ref 3.87–5.11)
RDW: 12.2 % (ref 11.5–15.5)
WBC: 8.1 10*3/uL (ref 4.0–10.5)
nRBC: 0 % (ref 0.0–0.2)

## 2023-01-19 LAB — WET PREP, GENITAL
Sperm: NONE SEEN
Trich, Wet Prep: NONE SEEN
WBC, Wet Prep HPF POC: >=10 — AB (ref <10)
Yeast Wet Prep HPF POC: NONE SEEN

## 2023-01-19 LAB — LIPASE, BLOOD: Lipase: 121 U/L — ABNORMAL HIGH (ref 11–51)

## 2023-01-19 LAB — RPR: RPR Ser Ql: NONREACTIVE

## 2023-01-19 LAB — HCG, SERUM, QUALITATIVE: Preg, Serum: NEGATIVE

## 2023-01-19 MED ORDER — SODIUM CHLORIDE 0.9 % IV SOLN
1.0000 g | Freq: Once | INTRAVENOUS | Status: AC
  Administered 2023-01-19: 1 g via INTRAVENOUS
  Filled 2023-01-19: qty 10

## 2023-01-19 MED ORDER — KETOROLAC TROMETHAMINE 30 MG/ML IJ SOLN
30.0000 mg | Freq: Once | INTRAMUSCULAR | Status: AC
  Administered 2023-01-19: 30 mg via INTRAVENOUS
  Filled 2023-01-19: qty 1

## 2023-01-19 MED ORDER — NAPROXEN 375 MG PO TABS: 375.0000 mg | ORAL_TABLET | Freq: Two times a day (BID) | ORAL | 0 refills | Status: AC

## 2023-01-19 MED ORDER — CEFTRIAXONE SODIUM 500 MG IJ SOLR: 500.0000 mg | Freq: Once | INTRAMUSCULAR | Status: DC

## 2023-01-19 MED ORDER — DOXYCYCLINE HYCLATE 100 MG PO TABS: 100.0000 mg | ORAL_TABLET | Freq: Two times a day (BID) | ORAL | 0 refills | Status: AC

## 2023-01-19 MED ORDER — LIDOCAINE HCL (PF) 1 % IJ SOLN: 1.0000 mL | Freq: Once | INTRAMUSCULAR | Status: DC

## 2023-01-19 MED ORDER — IOHEXOL 350 MG/ML SOLN
75.0000 mL | Freq: Once | INTRAVENOUS | Status: AC | PRN
  Administered 2023-01-19: 75 mL via INTRAVENOUS

## 2023-01-19 NOTE — ED Notes (Signed)
Patient transported to CT 

## 2023-01-19 NOTE — ED Provider Triage Note (Signed)
Emergency Medicine Provider Triage Evaluation Note  Kimmi Acocella , a 27 y.o. female  was evaluated in triage.  Pt complains of lower abdominal pain for the past 2 days. LMP normal.  Pt has iud  Review of Systems  Positive: Lower mid abdominal pain Negative: fever  Physical Exam  BP 109/61 (BP Location: Right Arm)   Pulse 97   Temp 99.1 F (37.3 C) (Oral)   Resp 18   Ht 5\' 7"  (1.702 m)   Wt 59 kg   SpO2 100%   BMI 20.36 kg/m  Gen:   Awake, no distress   Resp:  Normal effort  MSK:   Moves extremities without difficulty  Other:  Abdomen soft, tender suprapubic area  Medical Decision Making  Medically screening exam initiated at 10:48 AM.  Appropriate orders placed.  Akita Maxim was informed that the remainder of the evaluation will be completed by another provider, this initial triage assessment does not replace that evaluation, and the importance of remaining in the ED until their evaluation is complete.     Elson Areas, [place] 01/19/23 1049

## 2023-01-19 NOTE — ED Notes (Signed)
Pt states she has some stomach pain. EDP aware

## 2023-01-19 NOTE — ED Triage Notes (Signed)
Patient presents to ed via [facility] c/o abd.pain onset several days ago worse today , states the pain is sharp in nature and stabbing into her back and pelvic area.

## 2023-01-19 NOTE — ED Provider Notes (Signed)
[facility] EMERGENCY DE[facility]MENT AT [facility] Provider Note   CSN: [id number] Arrival date & time: 01/19/23  1010     History  Chief Complaint  Patient presents with   Abdominal Pain    Holly Greene is a 27 y.o. female.   Abdominal Pain    Patient has history of bipolar disorder hypertension.  She presents to the ED with complaints of lower abdominal pain.  Patient states her symptoms started about a week ago.  She has been having discomfort in her lower abdomen.  She denies any vaginal discharge or bleeding.  She denies any urinary symptoms.  Pain is primarily sharp and stabbing in her pelvic area  Home Medications Prior to Admission medications   Medication Sig Start Date End Date Taking? Authorizing Provider  acetaminophen (TYLENOL) 500 MG tablet Take 2 tablets (1,000 mg total) by mouth every 6 (six) hours. 05/26/22  Yes Mercado-Ortiz, Lahoma Crocker, DO  doxycycline (VIBRA-TABS) 100 MG tablet Take 1 tablet (100 mg total) by mouth 2 (two) times daily for 7 days. 01/19/23 01/26/23 Yes Linwood Dibbles, MD  naproxen (NAPROSYN) 375 MG tablet Take 1 tablet (375 mg total) by mouth 2 (two) times daily. 01/19/23  Yes Linwood Dibbles, MD  furosemide (LASIX) 20 MG tablet Take 1 tablet (20 mg total) by mouth 2 (two) times daily for 5 days. Patient not taking: Reported on 05/30/2022 05/26/22 05/31/22  Myrtie Hawk, DO      Allergies    Patient has no known allergies.    Review of Systems   Review of Systems  Gastrointestinal:  Positive for abdominal pain.    Physical Exam Updated Vital Signs BP 118/69   Pulse 69   Temp 97.9 F (36.6 C) (Oral)   Resp 17   Ht 1.702 m (5\' 7" )   Wt 59 kg   SpO2 100%   BMI 20.36 kg/m  Physical Exam Vitals and nursing note reviewed. Exam conducted with a chaperone present.  Constitutional:      General: She is not in acute distress.    Appearance: She is well-developed.  HENT:     Head: Normocephalic and atraumatic.     Right Ear:  External ear normal.     Left Ear: External ear normal.  Eyes:     General: No scleral icterus.       Right eye: No discharge.        Left eye: No discharge.     Conjunctiva/sclera: Conjunctivae normal.  Neck:     Trachea: No tracheal deviation.  Cardiovascular:     Rate and Rhythm: Normal rate and regular rhythm.  Pulmonary:     Effort: Pulmonary effort is normal. No respiratory distress.     Breath sounds: Normal breath sounds. No stridor. No wheezing or rales.  Abdominal:     General: Bowel sounds are normal. There is no distension.     Palpations: Abdomen is soft.     Tenderness: There is abdominal tenderness in the right lower quadrant, suprapubic area and left lower quadrant. There is no guarding or rebound.  Genitourinary:    Labia:        Right: No lesion.        Left: No lesion.      Vagina: No bleeding.     Cervix: Cervical motion tenderness present.     Uterus: Tender.      Adnexa:        Right: Tenderness present.  Left: Tenderness present.   Musculoskeletal:        General: No tenderness or deformity.     Cervical back: Neck supple.  Skin:    General: Skin is warm and dry.     Findings: No rash.  Neurological:     General: No focal deficit present.     Mental Status: She is alert.     Cranial Nerves: No cranial nerve deficit, dysarthria or facial asymmetry.     Sensory: No sensory deficit.     Motor: No abnormal muscle tone or seizure activity.     Coordination: Coordination normal.  Psychiatric:        Mood and Affect: Mood normal.     ED Results / Procedures / Treatments   Labs (all labs ordered are listed, but only abnormal results are displayed) Labs Reviewed  WET PREP, GENITAL - Abnormal; Notable for the following components:      Result Value   Clue Cells Wet Prep HPF POC PRESENT (*)    WBC, Wet Prep HPF POC >=10 (*)    All other components within normal limits  LIPASE, BLOOD - Abnormal; Notable for the following components:   Lipase  121 (*)    All other components within normal limits  COMPREHENSIVE METABOLIC PANEL - Abnormal; Notable for the following components:   Glucose, Bld 130 (*)    All other components within normal limits  URINALYSIS, ROUTINE W REFLEX MICROSCOPIC - Abnormal; Notable for the following components:   APPearance CLOUDY (*)    Nitrite POSITIVE (*)    Bacteria, UA MANY (*)    All other components within normal limits  CBC  HCG, SERUM, QUALITATIVE  RPR  HIV ANTIBODY (ROUTINE TESTING W REFLEX)  GC/CHLAMYDIA PROBE AMP ([place]) NOT AT [facility]    EKG None  Radiology CT ABDOMEN PELVIS W CONTRAST  Result Date: 01/19/2023 CLINICAL DATA:  Right lower quadrant abdominal pain. EXAM: CT ABDOMEN AND PELVIS WITH CONTRAST TECHNIQUE: Multidetector CT imaging of the abdomen and pelvis was performed using the standard protocol following bolus administration of intravenous contrast. RADIATION DOSE REDUCTION: This exam was performed according to the departmental dose-optimization program which includes automated exposure control, adjustment of the mA and/or kV according to patient size and/or use of iterative reconstruction technique. CONTRAST:  75mL OMNIPAQUE IOHEXOL 350 MG/ML SOLN COMPARISON:  None Available. FINDINGS: Lower chest: No acute abnormality. Hepatobiliary: Focal fat along the falciform ligament. No suspicious liver lesion. Gallbladder is unremarkable. No biliary dilatation. Pancreas: Unremarkable. No pancreatic ductal dilatation or surrounding inflammatory changes. Spleen: Normal in size without focal abnormality. Adrenals/Urinary Tract: Adrenal glands are unremarkable. Kidneys are normal, without renal calculi, focal lesion, or hydronephrosis. Mild bladder wall thickening, possibly due to underdistention. Stomach/Bowel: Normal stomach and duodenum. No dilated loops of small bowel. Normal appendix is visualized on axial image 58 series 3. Colon is unremarkable. No bowel wall thickening or surrounding  inflammation. Vascular/Lymphatic: Normal vasculature. Few prominent mesenteric lymph nodes in the right lower quadrant, measuring up to 12 mm in short axis (axial image 46 series 3). Reproductive: Malpositioned IUD within the uterine fundus. No suspicious adnexal lesions. Other: No abdominal wall hernia or abnormality. No abdominopelvic ascites. Musculoskeletal: Left-greater-than-right degenerative changes of the sacroiliac joints with reactive osteitis. IMPRESSION: 1. Normal appendix. 2. Few prominent mesenteric lymph nodes in the right lower quadrant, measuring up to 12 mm in short axis. Findings are nonspecific but can be seen in the setting of mesenteric adenitis. 3. Malpositioned IUD within the  uterine fundus. 4. Left-greater-than-right degenerative changes of the sacroiliac joints with reactive osteitis. Electronically Signed   By: Orvan Falconer M.D.   On: 01/19/2023 14:45    Procedures Procedures    Medications Ordered in ED Medications  ketorolac (TORADOL) 30 MG/ML injection 30 mg (has no administration in time range)  cefTRIAXone (ROCEPHIN) injection 500 mg (has no administration in time range)  lidocaine (PF) (XYLOCAINE) 1 % injection 1-2.1 mL (has no administration in time range)  cefTRIAXone (ROCEPHIN) 1 g in sodium chloride 0.9 % 100 mL IVPB (0 g Intravenous Stopped 01/19/23 1448)  iohexol (OMNIPAQUE) 350 MG/ML injection 75 mL (75 mLs Intravenous Contrast Given 01/19/23 1333)    ED Course/ Medical Decision Making/ A&P Clinical Course as of 01/19/23 1541  Sat Jan 19, 2023  1239 Labs reviewed.  No leukocytosis.  Urinalysis suggestive of possible infection with 6-10 whites many bacteria positive nitrate however there is also squamous cell contamination.  Lipase also elevated at 121.  Suggest pancreatitis however her pain is in her lower abdomen [JK]  1530 1. Normal appendix. 2. Few prominent mesenteric lymph nodes in the right lower quadrant, measuring up to 12 mm in short axis.  Findings are nonspecific but can be seen in the setting of mesenteric adenitis. 3. Malpositioned IUD within the uterine fundus. 4. Left-greater-than-right degenerative changes of the sacroiliac joints with reactive osteitis.   [JK]    Clinical Course User Index [JK] Linwood Dibbles, MD                                 Medical Decision Making Problems Addressed: Degenerative joint disease of sacroiliac joint [facility]): acute illness or injury that poses a threat to life or bodily functions PID (acute pelvic inflammatory disease): acute illness or injury that poses a threat to life or bodily functions  Amount and/or Complexity of Data Reviewed Labs: ordered. Decision-making details documented in ED Course. Radiology: ordered and independent interpretation performed.  Risk Prescription drug management.   Patient presented to the ED with complaints of lower abdominal pain.  Considered the possibility of diverticulitis colitis, PID TOA.  Initial laboratory testing notable for slight elevation in lipase.  Urinalysis abnormal but significant squamous cell contamination noted.  Patient not have any urinary symptoms to suggest definite UTI.  CT scan was performed and it shows normal appendix.  Few mesenteric lymph nodes noted.  Suspect symptoms related to PID based on her pelvic exam.  Will have her follow-up with OB/GYN regarding the malpositioned IUD.  Will also give her referral to rheumatology considering the sacroiliitis.       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

## 2023-01-19 NOTE — Discharge Instructions (Signed)
Take the medications as prescribed to treat your infection and pain.   The radiologist also noted on the CT scan that your IUD may not be in the idea location.  Follow-up with your OB/GYN doctor to be rechecked.

## 2023-01-21 LAB — GC/CHLAMYDIA PROBE AMP (~~LOC~~) NOT AT ARMC
Chlamydia: NEGATIVE
Comment: NEGATIVE
Comment: NORMAL
Neisseria Gonorrhea: NEGATIVE

## 2023-02-27 ENCOUNTER — Ambulatory Visit: Payer: 59 | Admitting: Obstetrics and Gynecology

## 2023-06-08 ENCOUNTER — Emergency Department (HOSPITAL_COMMUNITY)
Admission: EM | Admit: 2023-06-08 | Discharge: 2023-06-09 | Disposition: A | Discharge status: Home / Self Care | Attending: Emergency Medicine | Admitting: Emergency Medicine

## 2023-06-08 DIAGNOSIS — R55 Syncope and collapse: Secondary | ICD-10-CM | POA: Insufficient documentation

## 2023-06-08 DIAGNOSIS — Z79899 Other long term (current) drug therapy: Secondary | ICD-10-CM | POA: Diagnosis not present

## 2023-06-08 DIAGNOSIS — I10 Essential (primary) hypertension: Secondary | ICD-10-CM | POA: Insufficient documentation

## 2023-06-08 DIAGNOSIS — J45909 Unspecified asthma, uncomplicated: Secondary | ICD-10-CM | POA: Diagnosis not present

## 2023-06-08 LAB — CBC WITH DIFFERENTIAL/PLATELET
Abs Immature Granulocytes: 0.03 10*3/uL (ref 0.00–0.07)
Basophils Absolute: 0.1 10*3/uL (ref 0.0–0.1)
Basophils Relative: 0 %
Eosinophils Absolute: 0.1 10*3/uL (ref 0.0–0.5)
Eosinophils Relative: 1 %
HCT: 41 % (ref 36.0–46.0)
Hemoglobin: 13.8 g/dL (ref 12.0–15.0)
Immature Granulocytes: 0 %
Lymphocytes Relative: 13 %
Lymphs Abs: 1.5 10*3/uL (ref 0.7–4.0)
MCH: 31.2 pg (ref 26.0–34.0)
MCHC: 33.7 g/dL (ref 30.0–36.0)
MCV: 92.8 fL (ref 80.0–100.0)
Monocytes Absolute: 0.6 10*3/uL (ref 0.1–1.0)
Monocytes Relative: 5 %
Neutro Abs: 9.5 10*3/uL — ABNORMAL HIGH (ref 1.7–7.7)
Neutrophils Relative %: 81 %
Platelets: 272 10*3/uL (ref 150–400)
RBC: 4.42 MIL/uL (ref 3.87–5.11)
RDW: 12.3 % (ref 11.5–15.5)
WBC: 11.8 10*3/uL — ABNORMAL HIGH (ref 4.0–10.5)
nRBC: 0 % (ref 0.0–0.2)

## 2023-06-08 LAB — URINALYSIS, ROUTINE W REFLEX MICROSCOPIC
Bilirubin Urine: NEGATIVE
Glucose, UA: NEGATIVE mg/dL
Ketones, ur: NEGATIVE mg/dL
Nitrite: NEGATIVE
Protein, ur: 100 mg/dL — AB
Specific Gravity, Urine: 1.023 (ref 1.005–1.030)
Squamous Epithelial / HPF: >50 /HPF (ref 0–5)
pH: 6 (ref 5.0–8.0)

## 2023-06-08 LAB — BASIC METABOLIC PANEL
Anion gap: 11 (ref 5–15)
BUN: 11 mg/dL (ref 6–20)
CO2: 24 mmol/L (ref 22–32)
Calcium: 9.2 mg/dL (ref 8.9–10.3)
Chloride: 104 mmol/L (ref 98–111)
Creatinine, Ser: 0.86 mg/dL (ref 0.44–1.00)
GFR, Estimated: >60 mL/min (ref >60)
Glucose, Bld: 93 mg/dL (ref 70–99)
Potassium: 4.6 mmol/L (ref 3.5–5.1)
Sodium: 139 mmol/L (ref 135–145)

## 2023-06-08 LAB — RAPID URINE DRUG SCREEN, HOSP PERFORMED
Amphetamines: NOT DETECTED
Barbiturates: NOT DETECTED
Benzodiazepines: NOT DETECTED
Cocaine: NOT DETECTED
Opiates: NOT DETECTED
Tetrahydrocannabinol: POSITIVE — AB

## 2023-06-08 NOTE — ED Triage Notes (Signed)
 Patient comes in from home via EMS after a syncopal episode. Patient states that she hyperventilating and started to feel dizzy and passed.out. Per EMS, patient hit her head. Patient states that a situation similar has happened to her before. Patient reports currently having a headache and ringing in her left ear.

## 2023-06-09 DIAGNOSIS — R55 Syncope and collapse: Secondary | ICD-10-CM | POA: Diagnosis not present

## 2023-06-09 LAB — PREGNANCY, URINE: Preg Test, Ur: NEGATIVE

## 2023-06-09 MED ORDER — IBUPROFEN 400 MG PO TABS
600.0000 mg | ORAL_TABLET | Freq: Once | ORAL | Status: AC
  Administered 2023-06-09: 600 mg via ORAL
  Filled 2023-06-09: qty 1

## 2023-06-09 NOTE — Discharge Instructions (Signed)
 We recommend follow-up with a counselor or therapist to discuss your underlying anxiety.  You may benefit from use of medication for management.  Your evaluation in the emergency department has been reassuring.  You may return for any new or concerning symptoms.

## 2023-06-09 NOTE — ED Provider Notes (Signed)
 [place] EMERGENCY DEPARTMENT AT [facility] Provider Note   CSN: [id number] Arrival date & time: 06/08/23  2137     History  Chief Complaint  Patient presents with   Loss of Consciousness    Holly Greene is a 28 y.o. female.  28 year old female with a history of hypertension, asthma, bipolar 1 disorder presents to the emergency department for evaluation of syncope.  She reports that she has been struggling with anxiety.  This is typically worse for her between the months of December and April.  Patient has episodes of panic attacks with hyperventilation where she becomes lightheaded.  At times, the patient is unable to regulate her breathing well enough and she will pass out.  She was assisted to the ground this evening during her syncopal event; was told that she may have struck her head on the "icebox", but is unsure of this.  Triage references headache, but patient denies this.  She does note some tinnitus in her left ear as well as some discomfort around her left TMJ. No medications taken PTA. Denies lightheadedness or dizziness currently. No N/V, extremity numbness or paresthesias, extremity weakness, chest pain or palpitations, SOB.   The history is provided by the patient. No language interpreter was used.  Loss of Consciousness      Home Medications Prior to Admission medications   Medication Sig Start Date End Date Taking? Authorizing Provider  acetaminophen (TYLENOL) 500 MG tablet Take 2 tablets (1,000 mg total) by mouth every 6 (six) hours. 05/26/22   Mercado-Ortiz, Lahoma Crocker, DO  furosemide (LASIX) 20 MG tablet Take 1 tablet (20 mg total) by mouth 2 (two) times daily for 5 days. Patient not taking: Reported on 05/30/2022 05/26/22 05/31/22  Mercado-Ortiz, Lahoma Crocker, DO  naproxen (NAPROSYN) 375 MG tablet Take 1 tablet (375 mg total) by mouth 2 (two) times daily. 01/19/23   Linwood Dibbles, MD      Allergies    Patient has no known allergies.    Review of Systems    Review of Systems  Cardiovascular:  Positive for syncope.  Ten systems reviewed and are negative for acute change, except as noted in the HPI.    Physical Exam Updated Vital Signs BP (!) 137/100   Pulse 78   Temp 98.5 F (36.9 C)   Resp 15   Ht 5\' 8"  (1.727 m)   Wt 79.4 kg   SpO2 100%   BMI 26.61 kg/m   Physical Exam Vitals and nursing note reviewed.  Constitutional:      General: She is not in acute distress.    Appearance: She is well-developed. She is not diaphoretic.     Comments: Nontoxic appearing and in NAD  HENT:     Head: Normocephalic and atraumatic.     Comments: No hematoma or contusion to scalp.  No Battle sign or raccoon's eyes.  No skull instability. Eyes:     General: No scleral icterus.    Conjunctiva/sclera: Conjunctivae normal.  Neck:     Comments: No tenderness to palpation of the cervical midline.  No bony deformities, step-offs, crepitus. Cardiovascular:     Rate and Rhythm: Normal rate and regular rhythm.     Pulses: Normal pulses.  Pulmonary:     Effort: Pulmonary effort is normal. No respiratory distress.     Comments: Respirations even and unlabored Musculoskeletal:        General: Normal range of motion.     Cervical back: Normal range of motion.  Skin:    General: Skin is warm and dry.     Coloration: Skin is not pale.     Findings: No erythema or rash.  Neurological:     Mental Status: She is alert and oriented to person, place, and time.  Psychiatric:        Mood and Affect: Mood is anxious and depressed. Affect is tearful.        Speech: Speech normal.        Behavior: Behavior is cooperative.        Thought Content: Thought content normal.     ED Results / Procedures / Treatments   Labs (all labs ordered are listed, but only abnormal results are displayed) Labs Reviewed  CBC WITH DIFFERENTIAL/PLATELET - Abnormal; Notable for the following components:      Result Value   WBC 11.8 (*)    Neutro Abs 9.5 (*)    All other  components within normal limits  RAPID URINE DRUG SCREEN, HOSP PERFORMED - Abnormal; Notable for the following components:   Tetrahydrocannabinol POSITIVE (*)    All other components within normal limits  URINALYSIS, ROUTINE W REFLEX MICROSCOPIC - Abnormal; Notable for the following components:   Color, Urine AMBER (*)    APPearance CLOUDY (*)    Hgb urine dipstick SMALL (*)    Protein, ur 100 (*)    Leukocytes,Ua SMALL (*)    Bacteria, UA FEW (*)    All other components within normal limits  BASIC METABOLIC PANEL  PREGNANCY, URINE    EKG None  Radiology No results found.  Procedures Procedures    Medications Ordered in ED Medications  ibuprofen (ADVIL) tablet 600 mg (600 mg Oral Given 06/09/23 0120)    ED Course/ Medical Decision Making/ A&P Clinical Course as of 06/09/23 0239  Wynelle Link Jun 09, 2023  0003 Work up reassuring. Orthostatic vital signs negative. UA does have pyuria, but likely 2/2 contamination given 21-50 squamous cells. No bacteriuria. Nitrites negative.  [KH]    Clinical Course User Index [KH] Antony Madura, PA-C                                 Medical Decision Making Amount and/or Complexity of Data Reviewed Labs: ordered.   This patient presents to the ED for concern of syncope, this involves an extensive number of treatment options, and is a complaint that carries with it a high risk of complications and morbidity.  The differential diagnosis includes arrhythmia vs hypoxemia vs orthostatic hypotension vs substance use   Co morbidities that complicate the patient evaluation  HTN Asthma Bipolar disorder   Additional history obtained:  Additional history obtained from EMS personnel   Lab Tests:  I Ordered, and personally interpreted labs.  The pertinent results include:  WBC 11.8 (nonspecific). No electrolyte derangements. Pregnancy negative.   Cardiac Monitoring:  The patient was maintained on a cardiac monitor.  I personally viewed and  interpreted the cardiac monitored which showed an underlying rhythm of: NSR   Medicines ordered and prescription drug management:  I have reviewed the patients home medicines and have made adjustments as needed   Test Considered:  Ethanol   Problem List / ED Course:  As above Syncope in the setting of a panic attack, hyperventilation. Likely related to transient drop in CO2. Patient currently at baseline. Observed for over 4 hours without clinical decompensation. Laboratory work up reassuring. CBC and BMP WNL.  Not orthostatic. Provided resources for outpatient counseling for anxiety management.    Reevaluation:  After the interventions noted above, I reevaluated the patient and found that they have :stayed the same   Social Determinants of Health:  Single parent   Dispostion:  After consideration of the diagnostic results and the patients response to treatment, I feel that the patent would benefit from outpatient counseling for anxiety management. Discussed different coping mechanisms for anxiety. Return precautions discussed and provided. Patient discharged in stable condition with no unaddressed concerns.          Final Clinical Impression(s) / ED Diagnoses Final diagnoses:  Syncope and collapse    Rx / DC Orders ED Discharge Orders     None         Antony Madura, PA-C 06/09/23 0244    Gilda Crease, MD 06/09/23 [phone]

## 2023-10-09 ENCOUNTER — Emergency Department (HOSPITAL_COMMUNITY)
Admission: EM | Admit: 2023-10-09 | Discharge: 2023-10-09 | Disposition: A | Discharge status: Home / Self Care | Attending: Emergency Medicine | Admitting: Emergency Medicine

## 2023-10-09 ENCOUNTER — Encounter (HOSPITAL_COMMUNITY): Payer: Self-pay

## 2023-10-09 ENCOUNTER — Other Ambulatory Visit: Payer: Self-pay

## 2023-10-09 DIAGNOSIS — J069 Acute upper respiratory infection, unspecified: Secondary | ICD-10-CM | POA: Insufficient documentation

## 2023-10-09 DIAGNOSIS — R059 Cough, unspecified: Secondary | ICD-10-CM | POA: Diagnosis present

## 2023-10-09 DIAGNOSIS — I10 Essential (primary) hypertension: Secondary | ICD-10-CM | POA: Insufficient documentation

## 2023-10-09 DIAGNOSIS — J45909 Unspecified asthma, uncomplicated: Secondary | ICD-10-CM | POA: Insufficient documentation

## 2023-10-09 LAB — RESP PANEL BY RT-PCR (RSV, FLU A&B, COVID)  RVPGX2
Influenza A by PCR: NEGATIVE
Influenza B by PCR: NEGATIVE
Resp Syncytial Virus by PCR: NEGATIVE
SARS Coronavirus 2 by RT PCR: NEGATIVE

## 2023-10-09 MED ORDER — AMOXICILLIN-POT CLAVULANATE 875-125 MG PO TABS
1.0000 | ORAL_TABLET | Freq: Two times a day (BID) | ORAL | 0 refills | Status: AC
Start: 2023-10-09 — End: 2023-10-16

## 2023-10-09 MED ORDER — BENZONATATE 100 MG PO CAPS: 200.0000 mg | ORAL_CAPSULE | Freq: Two times a day (BID) | ORAL | 0 refills | Status: AC | PRN

## 2023-10-09 NOTE — ED Provider Notes (Signed)
 [place] EMERGENCY DEPARTMENT AT [facility] Provider Note   CSN: [id number] Arrival date & time: 10/09/23  1309     Patient presents with: Cough and Facial Pain   Holly Greene is a 28 y.o. female with PMHx asthma, bipolar 1 disorder, HTN who presents to the ED concern for maxillary sinus pressure, runny nose, dry cough since last Friday.  Patient endorses recent sick contact with her babies.  Patient has been taking Tylenol  for his symptoms with some relief.  Last of the Tylenol  was around 1 hour ago.  Patient is sure that she is not pregnant because she is on birth control.  Patient denies fever, sore throat, shortness of breath, nausea, vomiting, diarrhea.    Cough      Prior to Admission medications   Medication Sig Start Date End Date Taking? Authorizing Provider  amoxicillin -clavulanate (AUGMENTIN ) 875-125 MG tablet Take 1 tablet by mouth every 12 (twelve) hours for 7 days. 10/09/23 10/16/23 Yes Alexee Delsanto F, PA-C  benzonatate (TESSALON) 100 MG capsule Take 2 capsules (200 mg total) by mouth 2 (two) times daily as needed for cough. 10/09/23  Yes Hoy Fraction F, PA-C  acetaminophen  (TYLENOL ) 500 MG tablet Take 2 tablets (1,000 mg total) by mouth every 6 (six) hours. 05/26/22   Mercado-Ortiz, Harlene RODES, DO  furosemide  (LASIX ) 20 MG tablet Take 1 tablet (20 mg total) by mouth 2 (two) times daily for 5 days. Patient not taking: Reported on 05/30/2022 05/26/22 05/31/22  Richie Harlene RODES, DO  naproxen  (NAPROSYN ) 375 MG tablet Take 1 tablet (375 mg total) by mouth 2 (two) times daily. 01/19/23   Randol Simmonds, MD    Allergies: Patient has no known allergies.    Review of Systems  Respiratory:  Positive for cough.     Updated Vital Signs BP 127/67 (BP Location: Right Arm)   Pulse 70   Temp 98.2 F (36.8 C)   Resp 16   Ht 5' 7 (1.702 m)   Wt 79.4 kg   SpO2 98%   BMI 27.41 kg/m   Physical Exam Vitals and nursing note reviewed.  Constitutional:       General: She is not in acute distress.    Appearance: She is not ill-appearing or toxic-appearing.  HENT:     Head: Normocephalic and atraumatic.     Comments: Maxillary sinus tenderness to palpation    Mouth/Throat:     Mouth: Mucous membranes are moist.  Eyes:     General: No scleral icterus.       Right eye: No discharge.        Left eye: No discharge.     Conjunctiva/sclera: Conjunctivae normal.  Cardiovascular:     Rate and Rhythm: Normal rate and regular rhythm.     Pulses: Normal pulses.     Heart sounds: Normal heart sounds. No murmur heard. Pulmonary:     Effort: Pulmonary effort is normal. No respiratory distress.     Breath sounds: Normal breath sounds. No wheezing, rhonchi or rales.  Abdominal:     General: Abdomen is flat.  Musculoskeletal:     Right lower leg: No edema.     Left lower leg: No edema.  Skin:    General: Skin is warm and dry.     Findings: No rash.  Neurological:     General: No focal deficit present.     Mental Status: She is alert and oriented to person, place, and time. Mental status is at baseline.  Psychiatric:        Mood and Affect: Mood normal.        Behavior: Behavior normal.     (all labs ordered are listed, but only abnormal results are displayed) Labs Reviewed  RESP PANEL BY RT-PCR (RSV, FLU A&B, COVID)  [id number]    EKG: None  Radiology: No results found.   Procedures   Medications Ordered in the ED - No data to display                                  Medical Decision Making   This patient presents to the ED for concern of congestion, cough, this involves an extensive number of treatment options, and is a complaint that carries with it a high risk of complications and morbidity.  The differential diagnosis includes Flu/COVID/RSV, strep pharyngitis, sinusitis, peritonsillar abscess, retropharyngeal abscess, pneumonia, meningitis.   Co morbidities that complicate the patient evaluation  Asthma   Additional  history obtained:  No PCP listed in chart   Problem List / ED Course / Critical interventions / Medication management  Patient presents to ED concern for maxillary sinus pain, rhinorrhea, dry cough since Friday.  Physical exam with maxillary sinus tenderness to palpation.  Rest of physical exam reassuring.  Patient afebrile with stable vitals. Will prescribe patient Augmentin  for if her symptoms are not recurring in the next couple of days for possible bacterial sinusitis.  Recommended following up with PCP.  Patient verbalized understanding of plan. I have reviewed the patients home medicines and have made adjustments as needed The patient has been appropriately medically screened and/or stabilized in the ED. I have low suspicion for any other emergent medical condition which would require further screening, evaluation or treatment in the ED or require inpatient management. At time of discharge the patient is hemodynamically stable and in no acute distress. I have discussed work-up results and diagnosis with patient and answered all questions. Patient is agreeable with discharge plan. We discussed strict return precautions for returning to the emergency department and they verbalized understanding.     Social Determinants of Health:  none      Final diagnoses:  Viral upper respiratory tract infection    ED Discharge Orders          Ordered    amoxicillin -clavulanate (AUGMENTIN ) 875-125 MG tablet  Every 12 hours        10/09/23 1442    benzonatate (TESSALON) 100 MG capsule  2 times daily PRN        10/09/23 1442               Hoy Nidia FALCON, PA-C 10/09/23 1455    Emil Share, DO 10/09/23 1458

## 2023-10-09 NOTE — ED Triage Notes (Signed)
 Pt to er, pt states that she has been feeling poorly since Friday, states that she has some sinus pressure, headache, stuffy nose and cough, denies fever or chills.

## 2023-10-09 NOTE — Discharge Instructions (Addendum)
 I prescribed antibiotics for a few symptoms and not resolving in the next 48 hours.  Please follow-up with your primary care provider.  Seek emergency care if experiencing any new or worsening symptoms.  Alternating between 650 mg Tylenol  and 400 mg Advil : The best way to alternate taking Acetaminophen  (example Tylenol ) and Ibuprofen  (example Advil /Motrin ) is to take them 3 hours apart. For example, if you take ibuprofen  at 6 am you can then take Tylenol  at 9 am. You can continue this regimen throughout the day, making sure you do not exceed the recommended maximum dose for each drug.
